# Patient Record
Sex: Male | Born: 1960 | Race: White | Hispanic: No | Marital: Married | Smoking: Former smoker
Health system: Southern US, Community
[De-identification: ages and names within clinical notes are randomized; demographics above are authoritative.]

## PROBLEM LIST (undated history)

## (undated) DIAGNOSIS — I639 Cerebral infarction, unspecified: Secondary | ICD-10-CM

## (undated) DIAGNOSIS — I2510 Atherosclerotic heart disease of native coronary artery without angina pectoris: Secondary | ICD-10-CM

## (undated) DIAGNOSIS — K760 Fatty (change of) liver, not elsewhere classified: Secondary | ICD-10-CM

## (undated) DIAGNOSIS — E785 Hyperlipidemia, unspecified: Secondary | ICD-10-CM

## (undated) DIAGNOSIS — E119 Type 2 diabetes mellitus without complications: Secondary | ICD-10-CM

## (undated) HISTORY — DX: Hyperlipidemia, unspecified: E78.5

## (undated) HISTORY — PX: SHOULDER SURGERY: SHX246

## (undated) HISTORY — PX: CHOLECYSTECTOMY: SHX55

## (undated) HISTORY — DX: Cerebral infarction, unspecified: I63.9

## (undated) HISTORY — DX: Fatty (change of) liver, not elsewhere classified: K76.0

---

## 2007-03-04 DIAGNOSIS — J309 Allergic rhinitis, unspecified: Secondary | ICD-10-CM | POA: Insufficient documentation

## 2007-03-04 DIAGNOSIS — J45909 Unspecified asthma, uncomplicated: Secondary | ICD-10-CM | POA: Insufficient documentation

## 2010-10-24 DIAGNOSIS — M109 Gout, unspecified: Secondary | ICD-10-CM | POA: Insufficient documentation

## 2010-10-24 DIAGNOSIS — M5030 Other cervical disc degeneration, unspecified cervical region: Secondary | ICD-10-CM | POA: Insufficient documentation

## 2010-10-24 DIAGNOSIS — M1990 Unspecified osteoarthritis, unspecified site: Secondary | ICD-10-CM | POA: Insufficient documentation

## 2012-02-04 DIAGNOSIS — N390 Urinary tract infection, site not specified: Secondary | ICD-10-CM | POA: Insufficient documentation

## 2012-02-04 DIAGNOSIS — N451 Epididymitis: Secondary | ICD-10-CM | POA: Insufficient documentation

## 2013-11-22 DIAGNOSIS — E1142 Type 2 diabetes mellitus with diabetic polyneuropathy: Secondary | ICD-10-CM | POA: Diagnosis present

## 2014-03-02 DIAGNOSIS — M722 Plantar fascial fibromatosis: Secondary | ICD-10-CM | POA: Insufficient documentation

## 2014-04-17 DIAGNOSIS — K2960 Other gastritis without bleeding: Secondary | ICD-10-CM | POA: Diagnosis present

## 2014-04-17 DIAGNOSIS — E669 Obesity, unspecified: Secondary | ICD-10-CM | POA: Diagnosis present

## 2014-04-17 DIAGNOSIS — K449 Diaphragmatic hernia without obstruction or gangrene: Secondary | ICD-10-CM | POA: Insufficient documentation

## 2015-11-02 DIAGNOSIS — E559 Vitamin D deficiency, unspecified: Secondary | ICD-10-CM | POA: Insufficient documentation

## 2018-05-16 ENCOUNTER — Observation Stay (HOSPITAL_COMMUNITY)
Admission: EM | Admit: 2018-05-16 | Discharge: 2018-05-17 | Disposition: A | Discharge status: Home / Self Care | Payer: Medicare Other | Attending: Internal Medicine | Admitting: Internal Medicine

## 2018-05-16 ENCOUNTER — Other Ambulatory Visit: Payer: Self-pay

## 2018-05-16 ENCOUNTER — Emergency Department (HOSPITAL_COMMUNITY): Payer: Medicare Other

## 2018-05-16 ENCOUNTER — Encounter (HOSPITAL_COMMUNITY): Payer: Self-pay

## 2018-05-16 DIAGNOSIS — Z79899 Other long term (current) drug therapy: Secondary | ICD-10-CM | POA: Insufficient documentation

## 2018-05-16 DIAGNOSIS — E1142 Type 2 diabetes mellitus with diabetic polyneuropathy: Secondary | ICD-10-CM | POA: Diagnosis present

## 2018-05-16 DIAGNOSIS — R079 Chest pain, unspecified: Secondary | ICD-10-CM | POA: Diagnosis present

## 2018-05-16 DIAGNOSIS — R42 Dizziness and giddiness: Secondary | ICD-10-CM | POA: Diagnosis not present

## 2018-05-16 DIAGNOSIS — Z91030 Bee allergy status: Secondary | ICD-10-CM | POA: Insufficient documentation

## 2018-05-16 DIAGNOSIS — Z87891 Personal history of nicotine dependence: Secondary | ICD-10-CM | POA: Diagnosis not present

## 2018-05-16 DIAGNOSIS — Z888 Allergy status to other drugs, medicaments and biological substances status: Secondary | ICD-10-CM | POA: Diagnosis not present

## 2018-05-16 DIAGNOSIS — Z794 Long term (current) use of insulin: Secondary | ICD-10-CM | POA: Insufficient documentation

## 2018-05-16 DIAGNOSIS — R0602 Shortness of breath: Secondary | ICD-10-CM | POA: Diagnosis not present

## 2018-05-16 DIAGNOSIS — Z9989 Dependence on other enabling machines and devices: Secondary | ICD-10-CM | POA: Insufficient documentation

## 2018-05-16 DIAGNOSIS — K219 Gastro-esophageal reflux disease without esophagitis: Secondary | ICD-10-CM | POA: Diagnosis not present

## 2018-05-16 DIAGNOSIS — G4733 Obstructive sleep apnea (adult) (pediatric): Secondary | ICD-10-CM | POA: Insufficient documentation

## 2018-05-16 DIAGNOSIS — R0789 Other chest pain: Principal | ICD-10-CM | POA: Insufficient documentation

## 2018-05-16 DIAGNOSIS — Z6832 Body mass index (BMI) 32.0-32.9, adult: Secondary | ICD-10-CM | POA: Diagnosis not present

## 2018-05-16 DIAGNOSIS — Z8249 Family history of ischemic heart disease and other diseases of the circulatory system: Secondary | ICD-10-CM | POA: Insufficient documentation

## 2018-05-16 DIAGNOSIS — E1169 Type 2 diabetes mellitus with other specified complication: Secondary | ICD-10-CM | POA: Diagnosis present

## 2018-05-16 DIAGNOSIS — I209 Angina pectoris, unspecified: Secondary | ICD-10-CM

## 2018-05-16 DIAGNOSIS — E7800 Pure hypercholesterolemia, unspecified: Secondary | ICD-10-CM | POA: Diagnosis not present

## 2018-05-16 DIAGNOSIS — Z7982 Long term (current) use of aspirin: Secondary | ICD-10-CM | POA: Insufficient documentation

## 2018-05-16 DIAGNOSIS — E669 Obesity, unspecified: Secondary | ICD-10-CM | POA: Diagnosis present

## 2018-05-16 DIAGNOSIS — E785 Hyperlipidemia, unspecified: Secondary | ICD-10-CM | POA: Insufficient documentation

## 2018-05-16 DIAGNOSIS — Z8673 Personal history of transient ischemic attack (TIA), and cerebral infarction without residual deficits: Secondary | ICD-10-CM | POA: Diagnosis not present

## 2018-05-16 DIAGNOSIS — R05 Cough: Secondary | ICD-10-CM | POA: Diagnosis not present

## 2018-05-16 DIAGNOSIS — K2960 Other gastritis without bleeding: Secondary | ICD-10-CM | POA: Diagnosis present

## 2018-05-16 HISTORY — DX: Type 2 diabetes mellitus without complications: E11.9

## 2018-05-16 LAB — BASIC METABOLIC PANEL
ANION GAP: 15 (ref 5–15)
BUN: 17 mg/dL (ref 6–20)
CALCIUM: 9.3 mg/dL (ref 8.9–10.3)
CO2: 20 mmol/L — ABNORMAL LOW (ref 22–32)
Chloride: 97 mmol/L — ABNORMAL LOW (ref 98–111)
Creatinine, Ser: 1.09 mg/dL (ref 0.61–1.24)
GLUCOSE: 317 mg/dL — AB (ref 70–99)
Potassium: 4.5 mmol/L (ref 3.5–5.1)
SODIUM: 132 mmol/L — AB (ref 135–145)

## 2018-05-16 LAB — CBC
HCT: 45.1 % (ref 39.0–52.0)
Hemoglobin: 15.1 g/dL (ref 13.0–17.0)
MCH: 29.8 pg (ref 26.0–34.0)
MCHC: 33.5 g/dL (ref 30.0–36.0)
MCV: 89 fL (ref 78.0–100.0)
PLATELETS: 162 10*3/uL (ref 150–400)
RBC: 5.07 MIL/uL (ref 4.22–5.81)
RDW: 12.6 % (ref 11.5–15.5)
WBC: 6.5 10*3/uL (ref 4.0–10.5)

## 2018-05-16 LAB — GLUCOSE, CAPILLARY
GLUCOSE-CAPILLARY: 171 mg/dL — AB (ref 70–99)
Glucose-Capillary: 231 mg/dL — ABNORMAL HIGH (ref 70–99)

## 2018-05-16 LAB — TROPONIN I
Troponin I: <0.03 ng/mL (ref <0.03)
Troponin I: <0.03 ng/mL (ref <0.03)
Troponin I: <0.03 ng/mL (ref <0.03)

## 2018-05-16 LAB — I-STAT TROPONIN, ED: TROPONIN I, POC: 0 ng/mL (ref 0.00–0.08)

## 2018-05-16 LAB — CBG MONITORING, ED: GLUCOSE-CAPILLARY: 283 mg/dL — AB (ref 70–99)

## 2018-05-16 MED ORDER — ONDANSETRON HCL 4 MG/2ML IJ SOLN: 4.0000 mg | Freq: Four times a day (QID) | INTRAMUSCULAR | Status: DC | PRN

## 2018-05-16 MED ORDER — ASPIRIN 81 MG PO CHEW: 324.0000 mg | CHEWABLE_TABLET | Freq: Once | ORAL | Status: DC

## 2018-05-16 MED ORDER — MULTI-VITAMIN/MINERALS PO TABS: 1.0000 | ORAL_TABLET | Freq: Every day | ORAL | Status: DC

## 2018-05-16 MED ORDER — HYDROCODONE-ACETAMINOPHEN 5-325 MG PO TABS: 1.0000 | ORAL_TABLET | Freq: Two times a day (BID) | ORAL | Status: DC | PRN

## 2018-05-16 MED ORDER — INSULIN ASPART 100 UNIT/ML ~~LOC~~ SOLN
0.0000 [IU] | Freq: Three times a day (TID) | SUBCUTANEOUS | Status: DC
  Administered 2018-05-16: 5 [IU] via SUBCUTANEOUS
  Filled 2018-05-16: qty 1

## 2018-05-16 MED ORDER — PANTOPRAZOLE SODIUM 40 MG PO TBEC
40.0000 mg | DELAYED_RELEASE_TABLET | Freq: Every day | ORAL | Status: DC
  Filled 2018-05-16: qty 1

## 2018-05-16 MED ORDER — GI COCKTAIL ~~LOC~~: 30.0000 mL | Freq: Four times a day (QID) | ORAL | Status: DC | PRN

## 2018-05-16 MED ORDER — ENOXAPARIN SODIUM 40 MG/0.4ML ~~LOC~~ SOLN
40.0000 mg | SUBCUTANEOUS | Status: DC
  Administered 2018-05-16: 40 mg via SUBCUTANEOUS
  Filled 2018-05-16: qty 0.4

## 2018-05-16 MED ORDER — PRAVASTATIN SODIUM 40 MG PO TABS: 40.0000 mg | ORAL_TABLET | Freq: Every day | ORAL | Status: DC

## 2018-05-16 MED ORDER — MORPHINE SULFATE (PF) 2 MG/ML IV SOLN: 2.0000 mg | INTRAVENOUS | Status: DC | PRN

## 2018-05-16 MED ORDER — ASPIRIN EC 325 MG PO TBEC
325.0000 mg | DELAYED_RELEASE_TABLET | Freq: Every day | ORAL | Status: DC
  Filled 2018-05-16: qty 1

## 2018-05-16 MED ORDER — ACETAMINOPHEN 325 MG PO TABS: 650.0000 mg | ORAL_TABLET | ORAL | Status: DC | PRN

## 2018-05-16 MED ORDER — GLIPIZIDE 5 MG PO TABS
5.0000 mg | ORAL_TABLET | Freq: Every day | ORAL | Status: DC
  Administered 2018-05-17: 5 mg via ORAL
  Filled 2018-05-16: qty 1

## 2018-05-16 MED ORDER — GABAPENTIN 400 MG PO CAPS
800.0000 mg | ORAL_CAPSULE | Freq: Three times a day (TID) | ORAL | Status: DC
  Filled 2018-05-16 (×3): qty 2

## 2018-05-16 MED ORDER — INSULIN ASPART PROT & ASPART (70-30 MIX) 100 UNIT/ML ~~LOC~~ SUSP
25.0000 [IU] | Freq: Two times a day (BID) | SUBCUTANEOUS | Status: DC
  Administered 2018-05-16 – 2018-05-17 (×2): 25 [IU] via SUBCUTANEOUS
  Filled 2018-05-16: qty 10

## 2018-05-16 MED ORDER — ADULT MULTIVITAMIN W/MINERALS CH
1.0000 | ORAL_TABLET | Freq: Every day | ORAL | Status: DC
  Filled 2018-05-16 (×2): qty 1

## 2018-05-16 MED ORDER — DIPHENHYDRAMINE HCL 25 MG PO CAPS
25.0000 mg | ORAL_CAPSULE | Freq: Four times a day (QID) | ORAL | Status: DC | PRN
  Filled 2018-05-16: qty 1

## 2018-05-16 MED ORDER — ALLOPURINOL 300 MG PO TABS
300.0000 mg | ORAL_TABLET | Freq: Every day | ORAL | Status: DC
  Filled 2018-05-16 (×2): qty 1

## 2018-05-16 MED ORDER — CYCLOBENZAPRINE HCL 10 MG PO TABS: 10.0000 mg | ORAL_TABLET | Freq: Two times a day (BID) | ORAL | Status: DC | PRN

## 2018-05-16 NOTE — ED Notes (Signed)
Ordered lunch tray for patient

## 2018-05-16 NOTE — ED Notes (Signed)
Spoke to patient placement.  Will work on getting patient re-assigned to [facility]

## 2018-05-16 NOTE — Progress Notes (Signed)
Pt asking this nurse about antibiotic - Augmentin that was prescribed to him 2 days ago.  Sent text page to X. Blount asking about medication.

## 2018-05-16 NOTE — ED Triage Notes (Signed)
[facility] for sharp substernal chest pain, non radiating that started while pt was sitting watching his wife in a triatholon. Per EMS pt diaphoretic, nauseous, pale. Pt states does not have a cardiac hx, but did have a TIA in 2017. Pt also c/o cough x4 years. Pt states he only feels nauseous when he coughs. Pt given 324mg  aspirin, which pt states resolved his chest pain. Pt A+Ox4.

## 2018-05-16 NOTE — Progress Notes (Signed)
Pt declines Allopurinol at this time.  Pt  States he takes medication PRN depending on if he eats seafood.   Pt takes Gabapentin normally at 11am, 7pm, and bedtime and may not take the 7pm dose if he feels feet pain is tolerable.  Pt requesting Gabapentin at bedtime.

## 2018-05-16 NOTE — H&P (Signed)
History and Physical    Eric [name] ZOX:[id number] DOB: September 12, 1960 DOA: 05/16/2018  PCP: Marlowe-Rogers, Hidi, MD  Patient coming from: Watching a triathlon race here in town  I have personally briefly reviewed patient's old medical records in [facility]  Chief Complaint: Eric [name] for 6 to 12 hours  HPI: Eric [name] is a 57 y.o. male with medical history significant of Beatties type II, TIA, elevated cholesterol and gastroesophageal reflux disease who presents to the emergency department complaining of start sharp substernal chest [name] which does not radiate that started while the patient was sitting watching his wife in a triathlon about 6 hours ago.  Upon arrival of EMS patient was direct diaphoretic, he was nauseous and pale.  Patient states he does not have a cardiac history but did have a TIA in 2017.  He has had a cough for 4 years but states he only feels nauseous when he coughs.  He was recently started on Augmentin for his cough.  He was given aspirin which she states resolved his chest [name].  History of gastroesophageal reflux disease and takes proton pump inhibitor at home.  He enjoys dipping snuff but does not use any recreational drugs.  He smoked from when he was in the first grade until the 12th grade in 1981 and then stopped smoking.  Had episodes of chest [name] in the past and his EKG shows some Q waves instead of a prior myocardial damage but patient has no history of prior myocardial infarction.  ED Course: EKG shows Q waves, sodium is 132, blood glucose 317, chest x-ray unremarkable.  Troponin is negative  Review of Systems: As per HPI otherwise all other systems reviewed and  negative.    Past Medical History:  Diagnosis Date  . Diabetes mellitus without complication (HCC)     History reviewed. No pertinent surgical history.  Social History   Social History Narrative   Smoked from 1st grade to 12th grade then quit     reports that he quit smoking  about 38 years ago. His smoking use included cigarettes. He started smoking about 51 years ago. He has a 12.00 pack-year smoking history. His smokeless tobacco use includes snuff. He reports that he drinks alcohol. He reports that he does not use drugs.  Allergies  Allergen Reactions  . Bee Venom Swelling  . Lisinopril Cough    Coughing, sweating, sneezing    Family History  Problem Relation Age of Onset  . Hypothyroidism Mother   . Hyperlipidemia Mother   . Heart attack Eric [name]    Eric [name] died with first massive heart attack at 57 years old and was a non-smoker  Prior to Admission medications   Medication Sig Start Date End Date Taking? Authorizing Provider  allopurinol (ZYLOPRIM) 300 MG tablet Take 300 mg by mouth daily. 12/24/17  Yes [provider]  amoxicillin-clavulanate (AUGMENTIN) 875-125 MG tablet Take 875 tablets by mouth 2 (two) times daily. For 10 days 05/12/18 05/22/18 Yes [provider]  cyclobenzaprine (FLEXERIL) 10 MG tablet Take 10 mg by mouth 2 (two) times daily as needed for muscle spasms. 05/01/17  Yes [provider]  diphenhydrAMINE (BENADRYL) 25 MG tablet Take 25 mg by mouth every 6 (six) hours as needed for itching.   Yes [provider]  gabapentin (NEURONTIN) 400 MG capsule Take 800 mg by mouth 3 (three) times daily. 04/27/18  Yes [provider]  glipiZIDE (GLUCOTROL) 5 MG tablet Take 5 mg by mouth daily. 03/12/18  Yes [provider]  HYDROcodone-acetaminophen (NORCO/VICODIN) 5-325 MG tablet Take 1 tablet by mouth 2 (two) times daily as needed for moderate [name] (for 30 days).  05/15/18 06/14/18 Yes [provider]  insulin lispro protamine-lispro (HUMALOG 75/25 MIX) (75-25) 100 UNIT/ML SUSP injection Inject 30-35 Units into the skin See admin instructions. Inject 35 units in am and 30 units bedtime. 02/11/18  Yes [provider]  lovastatin (MEVACOR) 40 MG tablet Take 40 mg by mouth daily. 04/27/18   Yes [provider]  metFORMIN (GLUCOPHAGE) 1000 MG tablet Take 1,000 mg by mouth 2 (two) times daily with a meal.  04/01/18  Yes [provider]  Multiple Vitamins-Minerals (MULTIVITAMIN WITH MINERALS) tablet Take 1 tablet by mouth daily.   Yes [provider]  omeprazole (PRILOSEC) 20 MG capsule Take 40 mg by mouth daily as needed (acid reflux).  03/25/18  Yes [provider]  OVER THE COUNTER MEDICATION Take 2 tablets by mouth daily. Eye vitamin (bill-berry eye vision)   Yes [provider]    Physical Exam:  Constitutional: NAD, calm, comfortable Vitals:   05/16/18 1208 05/16/18 1215 05/16/18 1315 05/16/18 1415  BP: 119/84 119/88 108/81 104/90  Pulse: 83 89 78 76  Resp: 18 16 17 20   Temp: 98.1 F (36.7 C)     TempSrc: Oral     SpO2: 98% 99% 96% 97%   Eyes: PERRL, lids and conjunctivae normal ENMT: Mucous membranes are moist. Posterior pharynx clear of any exudate or lesions.Normal dentition.  Neck: normal, supple, no masses, no thyromegaly Respiratory: clear to auscultation bilaterally, no wheezing, no crackles. Normal respiratory effort. No accessory muscle use.  Cardiovascular: Regular rate and rhythm, no murmurs / rubs / gallops. No extremity edema. 2+ pedal pulses. No carotid bruits.  Abdomen: no tenderness, no masses palpated. No hepatosplenomegaly. Bowel sounds positive.  Musculoskeletal: no clubbing / cyanosis. No joint deformity upper and lower extremities. Good ROM, no contractures. Normal muscle tone.  Skin: no rashes, lesions, ulcers. No induration Neurologic: CN 2-12 grossly intact. Sensation intact, DTR normal. Strength 5/5 in all 4.  Psychiatric: Normal judgment and insight. Alert and oriented x 3. Normal mood.    Labs on Admission: I have personally reviewed following labs and imaging studies  CBC: Recent Labs  Lab 05/16/18 1216  WBC 6.5  HGB 15.1  HCT 45.1  MCV 89.0  PLT 162   Basic Metabolic Panel: Recent Labs   Lab 05/16/18 1216  NA 132*  K 4.5  CL 97*  CO2 20*  GLUCOSE 317*  BUN 17  CREATININE 1.09  CALCIUM 9.3   CBG: Recent Labs  Lab 05/16/18 1232  GLUCAP 283*   Radiological Exams on Admission: Dg Chest Portable 1 View  Result Date: 05/16/2018 CLINICAL DATA:  Chest [name], shortness of breath and dizziness onset today. History of diabetes. EXAM: PORTABLE CHEST 1 VIEW COMPARISON:  None. FINDINGS: The heart size and mediastinal contours are within normal limits. Both lungs are clear. The visualized skeletal structures are unremarkable. IMPRESSION: No active disease.  No evidence of pneumonia or pulmonary edema. Electronically Signed   By: Bary Richard M.D.   On: 05/16/2018 12:44    EKG: Independently reviewed.  Sinus rhythm with borderline intraventricular conduction delay and inferior Q waves read no priors available for comparison    Assessment/Plan Principal Problem:   Chest [name] Active Problems:   Diabetic peripheral neuropathy associated with type 2 diabetes mellitus (HCC)   Type 2 diabetes mellitus with hyperlipidemia (HCC)  Obesity (BMI 30-39.9)   Reflux gastritis   1.  Chest [name]: Patient with several risk factors including diabetes and hypercholesterolemia and family history.  His history is suggestive.  He has had a prior TIA.  Recommend patient placed in observation to rule out myocardial infarction, will check echocardiogram to evaluate Q waves on EKG and consider stress testing.  2.  Diabetic peripheral neuropathy with type 2 diabetes: Noted continue outpatient management.  3.  Hyperlipidemia associated with type 2 diabetes mellitus: Continue statin.  4.  Obesity: Noted loss urged.  5.Reflux gastritis: Noted continue proton pump inhibitor.  DVT prophylaxis: Lovenox Code Status: Code Family Communication: Talk with patient and wife who was present at the bedside. Disposition Plan: Likely home in 24 to 48 hours Consults called: None message sent to Daun Peacockrish Trent  for a.m. evaluation for possible stress test versus cardiac cath Admission status: Observation   Lahoma Crockerheresa C Margi Edmundson MD FACP [facility] Pager [phone]- [phone]  If 7PM-7AM, please contact night-coverage [email or web address] Password [email or web address]  05/16/2018, 2:26 PM

## 2018-05-16 NOTE — ED Provider Notes (Signed)
[facility] EMERGENCY DEPARTMENT Provider Note   CSN: [id number] Arrival date & time: 05/16/18  1157     History   Chief Complaint Chief Complaint  Patient presents with  . Chest Pain    HPI Eric [name] is a 57 y.o. male.  The history is provided by the patient.  Chest Pain   This is a new problem. The current episode started 6 to 12 hours ago. The problem occurs every several days. The problem has been resolved. The pain is associated with exertion and rest. The pain is present in the substernal region. The pain is at a severity of 3/10. The pain is moderate. The quality of the pain is described as pressure-like. The pain does not radiate. The symptoms are aggravated by exertion. Associated symptoms include diaphoresis. Pertinent negatives include no abdominal pain, no back pain, no cough, no fever, no orthopnea, no palpitations, no shortness of breath, no syncope and no vomiting. He has tried rest for the symptoms. The treatment provided mild relief. Risk factors include male gender.  His past medical history is significant for diabetes, hyperlipidemia and TIA.  Pertinent negatives for past medical history include no hypertension and no seizures.  Procedure history is negative for cardiac catheterization.    Past Medical History:  Diagnosis Date  . Diabetes mellitus without complication [facility])     Patient Active Problem List   Diagnosis Date Noted  . Chest pain 05/16/2018  . Type 2 diabetes mellitus with hyperlipidemia (HCC) 05/16/2018  . Obesity (BMI 30-39.9) 04/17/2014  . Reflux gastritis 04/17/2014  . Diabetic peripheral neuropathy associated with type 2 diabetes mellitus (HCC) 11/22/2013    History reviewed. No pertinent surgical history.      Home Medications    Prior to Admission medications   Medication Sig Start Date End Date Taking? Authorizing Provider  allopurinol (ZYLOPRIM) 300 MG tablet Take 300 mg by mouth daily. 12/24/17  Yes  [provider]  amoxicillin-clavulanate (AUGMENTIN) 875-125 MG tablet Take 875 tablets by mouth 2 (two) times daily. For 10 days 05/12/18 05/22/18 Yes [provider]  cyclobenzaprine (FLEXERIL) 10 MG tablet Take 10 mg by mouth 2 (two) times daily as needed for muscle spasms. 05/01/17  Yes [provider]  diphenhydrAMINE (BENADRYL) 25 MG tablet Take 25 mg by mouth every 6 (six) hours as needed for itching.   Yes [provider]  gabapentin (NEURONTIN) 400 MG capsule Take 800 mg by mouth 3 (three) times daily. 04/27/18  Yes [provider]  glipiZIDE (GLUCOTROL) 5 MG tablet Take 5 mg by mouth daily. 03/12/18  Yes [provider]  HYDROcodone-acetaminophen (NORCO/VICODIN) 5-325 MG tablet Take 1 tablet by mouth 2 (two) times daily as needed for moderate pain (for 30 days).  05/15/18 06/14/18 Yes [provider]  insulin lispro protamine-lispro (HUMALOG 75/25 MIX) (75-25) 100 UNIT/ML SUSP injection Inject 30-35 Units into the skin See admin instructions. Inject 35 units in am and 30 units bedtime. 02/11/18  Yes [provider]  lovastatin (MEVACOR) 40 MG tablet Take 40 mg by mouth daily. 04/27/18  Yes [provider]  metFORMIN (GLUCOPHAGE) 1000 MG tablet Take 1,000 mg by mouth 2 (two) times daily with a meal.  04/01/18  Yes [provider]  Multiple Vitamins-Minerals (MULTIVITAMIN WITH MINERALS) tablet Take 1 tablet by mouth daily.   Yes [provider]  omeprazole (PRILOSEC) 20 MG capsule Take 40 mg by mouth daily as needed (acid reflux).  03/25/18  Yes [provider]  OVER THE COUNTER MEDICATION Take 2 tablets by mouth daily. Eye vitamin (bill-berry eye vision)   Yes [provider]    Family History Family History  Problem Relation Age of Onset  . Hypothyroidism Mother   . Hyperlipidemia Mother   . Heart attack Father     Social History Social History   Tobacco Use  . Smoking  status: Former Smoker    Packs/day: 1.00    Years: 12.00    Pack years: 12.00    Types: Cigarettes    Start date: 1968    Last attempt to quit: 1981    Years since quitting: 38.7  . Smokeless tobacco: Current User    Types: Snuff  Substance Use Topics  . Alcohol use: Yes    Comment: rare social couple of times a year  . Drug use: Never     Allergies   Bee venom and Lisinopril   Review of Systems Review of Systems  Constitutional: Positive for diaphoresis. Negative for chills and fever.  HENT: Negative for ear pain and sore throat.   Eyes: Negative for pain and visual disturbance.  Respiratory: Negative for cough and shortness of breath.   Cardiovascular: Positive for chest pain. Negative for palpitations, orthopnea and syncope.  Gastrointestinal: Negative for abdominal pain and vomiting.  Genitourinary: Negative for dysuria and hematuria.  Musculoskeletal: Negative for arthralgias and back pain.  Skin: Negative for color change and rash.  Neurological: Negative for seizures and syncope.  All other systems reviewed and are negative.    Physical Exam Updated Vital Signs  ED Triage Vitals  Enc Vitals Group     BP 05/16/18 1208 119/84     Pulse Rate 05/16/18 1208 83     Resp 05/16/18 1208 18     Temp 05/16/18 1208 98.1 F (36.7 C)     Temp Source 05/16/18 1208 Oral     SpO2 05/16/18 1208 98 %     Weight --      Height --      Head Circumference --      Peak Flow --      Pain Score 05/16/18 1207 0     Pain Loc --      Pain Edu? --      Excl. in GC? --     Physical Exam  Constitutional: He is oriented to person, place, and time. He appears well-developed and well-nourished.  HENT:  Head: Normocephalic and atraumatic.  Eyes: Pupils are equal, round, and reactive to light. Conjunctivae and EOM are normal.  Neck: Normal range of motion. Neck supple.  Cardiovascular: Normal rate, regular rhythm, intact distal pulses and normal pulses.  No murmur  heard. Pulmonary/Chest: Effort normal and breath sounds normal. No respiratory distress. He has no decreased breath sounds.  Abdominal: Soft. There is no tenderness.  Musculoskeletal: He exhibits no edema.       Right lower leg: He exhibits no edema.       Left lower leg: He exhibits no edema.  Neurological: He is alert and oriented to person, place, and time.  Skin: Skin is warm and dry. Capillary refill takes less than 2 seconds.  Psychiatric: He has a normal mood and affect.  Nursing note and vitals reviewed.    ED Treatments / Results  Labs (all labs ordered are listed, but only abnormal results are displayed) Labs Reviewed  BASIC METABOLIC PANEL - Abnormal; Notable for the following components:      Result Value  Sodium 132 (*)    Chloride 97 (*)    CO2 20 (*)    Glucose, Bld 317 (*)    All other components within normal limits  CBG MONITORING, ED - Abnormal; Notable for the following components:   Glucose-Capillary 283 (*)    All other components within normal limits  CBC  HIV ANTIBODY (ROUTINE TESTING W REFLEX)  HEMOGLOBIN A1C  TROPONIN I  TROPONIN I  TROPONIN I  I-STAT TROPONIN, ED    EKG EKG Interpretation  Date/Time:  Sunday May 16 2018 12:10:54 EDT Ventricular Rate:  80 PR Interval:    QRS Duration: 113 QT Interval:  371 QTC Calculation: 428 R Axis:   52 Text Interpretation:  Sinus rhythm Borderline intraventricular conduction delay Abnormal inferior Q waves Confirmed by Virgina NorfolkAdam, Kimberleigh Mehan [phone]([id number]) on 05/16/2018 12:14:38 PM   Radiology Dg Chest Portable 1 View  Result Date: 05/16/2018 CLINICAL DATA:  Chest pain, shortness of breath and dizziness onset today. History of diabetes. EXAM: PORTABLE CHEST 1 VIEW COMPARISON:  None. FINDINGS: The heart size and mediastinal contours are within normal limits. Both lungs are clear. The visualized skeletal structures are unremarkable. IMPRESSION: No active disease.  No evidence of pneumonia or pulmonary edema.  Electronically Signed   By: Bary RichardStan  Maynard M.D.   On: 05/16/2018 12:44    Procedures Procedures (including critical care time)  Medications Ordered in ED Medications  aspirin chewable tablet 324 mg (324 mg Oral Not Given 05/16/18 1214)  allopurinol (ZYLOPRIM) tablet 300 mg (has no administration in time range)  HYDROcodone-acetaminophen (NORCO/VICODIN) 5-325 MG per tablet 1 tablet (has no administration in time range)  pravastatin (PRAVACHOL) tablet 40 mg (has no administration in time range)  glipiZIDE (GLUCOTROL) tablet 5 mg (has no administration in time range)  insulin aspart protamine- aspart (NOVOLOG MIX 70/30) injection 25 Units (has no administration in time range)  pantoprazole (PROTONIX) EC tablet 40 mg (has no administration in time range)  cyclobenzaprine (FLEXERIL) tablet 10 mg (has no administration in time range)  gabapentin (NEURONTIN) capsule 800 mg (has no administration in time range)  multivitamin with minerals tablet 1 tablet (has no administration in time range)  diphenhydrAMINE (BENADRYL) tablet 25 mg (has no administration in time range)  acetaminophen (TYLENOL) tablet 650 mg (has no administration in time range)  ondansetron (ZOFRAN) injection 4 mg (has no administration in time range)  insulin aspart (novoLOG) injection 0-15 Units (has no administration in time range)  enoxaparin (LOVENOX) injection 40 mg (has no administration in time range)  morphine 2 MG/ML injection 2 mg (has no administration in time range)  gi cocktail (Maalox,Lidocaine,Donnatal) (has no administration in time range)  aspirin EC tablet 325 mg (has no administration in time range)     Initial Impression / Assessment and Plan / ED Course  I have reviewed the triage vital signs and the nursing notes.  Pertinent labs & imaging results that were available during my care of the patient were reviewed by me and considered in my medical decision making (see chart for details).     Jeralene PetersKelly  Toohey is a 57 year old male with history of diabetes, high cholesterol who presents to the ED with chest pain.  Patient with normal vitals.  No fever.  Patient with episode of chest pain several hours ago that left him diaphoretic, nauseous.  Patient took 324 mg aspirin with EMS and pain has resolved.  Patient has had intermittent chest pain over the last several weeks.  EKG shows sinus rhythm.  No signs of ischemic changes.  However patient does have some Q waves and appear that patient possibly had cardiac event recently.  Has history of high cholesterol, diabetes, TIA.  Patient with no DVT or PE risk factors.  No infectious symptoms.  Concern for ACS.  Patient at high risk.  Initial troponin within normal limits.  Patient with no significant anemia, electrolyte abnormality.  No kidney injury.  Patient likely with anginal chest pain.  Chest x-ray showed no signs of pneumonia, pneumothorax, no pleural effusion.  Patient to be admitted to medicine service for further cardiac rule out.  Remained hemodynamically stable throughout my care.  Final Clinical Impressions(s) / ED Diagnoses   Final diagnoses:  Chest pain, unspecified type    ED Discharge Orders    None       Virgina Norfolk, DO 05/16/18 1433

## 2018-05-17 ENCOUNTER — Other Ambulatory Visit (HOSPITAL_COMMUNITY): Payer: Medicare Other

## 2018-05-17 ENCOUNTER — Other Ambulatory Visit: Payer: Self-pay | Admitting: Cardiology

## 2018-05-17 DIAGNOSIS — E1142 Type 2 diabetes mellitus with diabetic polyneuropathy: Secondary | ICD-10-CM

## 2018-05-17 DIAGNOSIS — E782 Mixed hyperlipidemia: Secondary | ICD-10-CM | POA: Diagnosis not present

## 2018-05-17 DIAGNOSIS — E1169 Type 2 diabetes mellitus with other specified complication: Secondary | ICD-10-CM

## 2018-05-17 DIAGNOSIS — R079 Chest pain, unspecified: Secondary | ICD-10-CM

## 2018-05-17 DIAGNOSIS — R072 Precordial pain: Secondary | ICD-10-CM

## 2018-05-17 DIAGNOSIS — E785 Hyperlipidemia, unspecified: Secondary | ICD-10-CM

## 2018-05-17 DIAGNOSIS — E669 Obesity, unspecified: Secondary | ICD-10-CM | POA: Diagnosis not present

## 2018-05-17 DIAGNOSIS — R0789 Other chest pain: Secondary | ICD-10-CM

## 2018-05-17 LAB — HEMOGLOBIN A1C
HEMOGLOBIN A1C: 10.2 % — AB (ref 4.8–5.6)
MEAN PLASMA GLUCOSE: 246 mg/dL

## 2018-05-17 LAB — HIV ANTIBODY (ROUTINE TESTING W REFLEX): HIV SCREEN 4TH GENERATION: NONREACTIVE

## 2018-05-17 LAB — GLUCOSE, CAPILLARY: Glucose-Capillary: 175 mg/dL — ABNORMAL HIGH (ref 70–99)

## 2018-05-17 MED ORDER — ASPIRIN 325 MG PO TBEC: 325.0000 mg | DELAYED_RELEASE_TABLET | Freq: Every day | ORAL | 0 refills | Status: DC

## 2018-05-17 MED ORDER — AMOXICILLIN-POT CLAVULANATE 875-125 MG PO TABS
875.0000 mg | ORAL_TABLET | Freq: Two times a day (BID) | ORAL | Status: DC
  Filled 2018-05-17: qty 1

## 2018-05-17 MED ORDER — PRAVASTATIN SODIUM 40 MG PO TABS
40.0000 mg | ORAL_TABLET | Freq: Every day | ORAL | Status: DC
  Filled 2018-05-17: qty 1

## 2018-05-17 MED ORDER — AMOXICILLIN-POT CLAVULANATE 875-125 MG PO TABS: 875.0000 mg | ORAL_TABLET | Freq: Two times a day (BID) | ORAL | 0 refills | Status: AC

## 2018-05-17 NOTE — Progress Notes (Addendum)
Pt and wife states they understands instructions. Home stable with steady gait. Pt offered wheel chair. Pt denies chest pain.

## 2018-05-17 NOTE — Progress Notes (Signed)
Pt refuses all 10 am medications. States he will take at home.

## 2018-05-17 NOTE — Discharge Summary (Signed)
Physician Discharge Summary  Eric [name] Alcorta JYN:[id number] DOB: 06/15/1961 DOA: 05/16/2018  PCP: Rudi CocoMarlowe-Rogers, Hidi, MD  Admit date: 05/16/2018 Discharge date: 05/17/2018  Admitted From: home Discharge disposition: home   Recommendations for Outpatient Follow-Up:   1. Outpatient CTA and echo 2. Needs better blood sugar control-- still drinks soda daily   Discharge Diagnosis:   Principal Problem:   Chest pain Active Problems:   Diabetic peripheral neuropathy associated with type 2 diabetes mellitus (HCC)   Obesity (BMI 30-39.9)   Reflux gastritis   Type 2 diabetes mellitus with hyperlipidemia (HCC)    Discharge Condition: Improved.  Diet recommendation: Low sodium, heart healthy.  Carbohydrate-modified.    Wound care: None.  Code status: Full.   History of Present Illness:   Eric [name] Giambalvo is a 57 y.o. male with medical history significant of Beatties type II, TIA, elevated cholesterol and gastroesophageal reflux disease who presents to the emergency department complaining of start sharp substernal chest pain which does not radiate that started while the patient was sitting watching his wife in a triathlon about 6 hours ago.  Upon arrival of EMS patient was direct diaphoretic, he was nauseous and pale.  Patient states he does not have a cardiac history but did have a TIA in 2017.  He has had a cough for 4 years but states he only feels nauseous when he coughs.  He was recently started on Augmentin for his cough.  He was given aspirin which she states resolved his chest pain.  History of gastroesophageal reflux disease and takes proton pump inhibitor at home.  He enjoys dipping snuff but does not use any recreational drugs.  He smoked from when he was in the first grade until the 12th grade in 1981 and then stopped smoking.  Had episodes of chest pain in the past and his EKG shows some Q waves instead of a prior myocardial damage but patient has no history of prior  myocardial infarction.   Hospital Course by Problem:   Chest pain: -While outside watching an athletic event the patient broke out in a cold sweat, felt clammy and was dizzy with central chest tightness.  No shortness of breath. Lasted about an hour, resolved after aspirin given.  -Currently chest pain-free -CVD risk factors include obesity, hyperlipidemia, poorly controlled DM, OSA essentially untreated -Troponins have been negative X 3.  -EKG with borderline IVCD, no prior for comparison.  -CXR shows no active disease -Echocardiogram can be done as outpatient -With no objective evidence of myocardial ischemia he can be discharged from a cardiology standpoint. He does have risk factors that are currently no well controlled. We advise an outpatient coronary CTA. He wants to follow with [facility] near his home and he is advised to do this. We will arrange for follow up at our office and CCTA in case he does not get into [facility]. He can cancel this if [facility].   Hyperlipidemia:  -On lovastatin 40 mg, previously tried lipitor, Zocor, Crestor.  -Uncontrolled with TC 230, Trig 592, Unable to calc LDL, HDL 28 in 10/2017 -The patient needs better blood sugar control to get his triglycerides down. -[facility]  DM type 2 on insulin:  -A1c was 10.0 on 05/12/18 previously 13.6 in 01/2018. Poorly controlled. -Target better control with A1c <7.  OSA -Pt has CPAP but only uses it rarely, when he is really tired. Advise regular use of CPAP for benefit and reduced  future risk of organ damage.     Medical Consultants:    cards  Discharge Exam:   Vitals:   05/17/18 0531 05/17/18 0954  BP: 120/85 129/85  Pulse: 68 77  Resp: 18 18  Temp: 98.6 F (37 C) 98.6 F (37 C)  SpO2: 98% 96%   Vitals:   05/16/18 1614 05/17/18 0004 05/17/18 0531 05/17/18 0954  BP:  116/78 120/85 129/85  Pulse:  72 68 77  Resp:  16 18 18   Temp:  98.7 F (37.1 C) 98.6  F (37 C) 98.6 F (37 C)  TempSrc:  Oral Oral Oral  SpO2:  98% 98% 96%  Weight: 93.9 kg     Height:        General exam: Appears calm and comfortable.   The results of significant diagnostics from this hospitalization (including imaging, microbiology, ancillary and laboratory) are listed below for reference.     Procedures and Diagnostic Studies:   Dg Chest Portable 1 View  Result Date: 05/16/2018 CLINICAL DATA:  Chest pain, shortness of breath and dizziness onset today. History of diabetes. EXAM: PORTABLE CHEST 1 VIEW COMPARISON:  None. FINDINGS: The heart size and mediastinal contours are within normal limits. Both lungs are clear. The visualized skeletal structures are unremarkable. IMPRESSION: No active disease.  No evidence of pneumonia or pulmonary edema. Electronically Signed   By: Bary Richard M.D.   On: 05/16/2018 12:44     Labs:   Basic Metabolic Panel: Recent Labs  Lab 05/16/18 1216  NA 132*  K 4.5  CL 97*  CO2 20*  GLUCOSE 317*  BUN 17  CREATININE 1.09  CALCIUM 9.3   GFR Estimated Creatinine Clearance: 81.6 mL/min (by C-G formula based on SCr of 1.09 mg/dL). Liver Function Tests: No results for input(s): AST, ALT, ALKPHOS, BILITOT, PROT, ALBUMIN in the last 168 hours. No results for input(s): LIPASE, AMYLASE in the last 168 hours. No results for input(s): AMMONIA in the last 168 hours. Coagulation profile No results for input(s): INR, PROTIME in the last 168 hours.  CBC: Recent Labs  Lab 05/16/18 1216  WBC 6.5  HGB 15.1  HCT 45.1  MCV 89.0  PLT 162   Cardiac Enzymes: Recent Labs  Lab 05/16/18 1430 05/16/18 1659 05/16/18 2031  TROPONINI <0.03 <0.03 <0.03   BNP: Invalid input(s): POCBNP CBG: Recent Labs  Lab 05/16/18 1232 05/16/18 1556 05/16/18 2248 05/17/18 0724  GLUCAP 283* 231* 171* 175*   D-Dimer No results for input(s): DDIMER in the last 72 hours. Hgb A1c No results for input(s): HGBA1C in the last 72 hours. Lipid  Profile No results for input(s): CHOL, HDL, LDLCALC, TRIG, CHOLHDL, LDLDIRECT in the last 72 hours. Thyroid function studies No results for input(s): TSH, T4TOTAL, T3FREE, THYROIDAB in the last 72 hours.  Invalid input(s): FREET3 Anemia work up No results for input(s): VITAMINB12, FOLATE, FERRITIN, TIBC, IRON, RETICCTPCT in the last 72 hours. Microbiology No results found for this or any previous visit (from the past 240 hour(s)).   Discharge Instructions:   Discharge Instructions    Diet - low sodium heart healthy   Complete by:  As directed    Diet Carb Modified   Complete by:  As directed    Increase activity slowly   Complete by:  As directed      Allergies as of 05/17/2018      Reactions   Bee Venom Swelling   Lisinopril Cough   Coughing, sweating, sneezing  Medication List    TAKE these medications   allopurinol 300 MG tablet Commonly known as:  ZYLOPRIM Take 300 mg by mouth daily.   amoxicillin-clavulanate 875-125 MG tablet Commonly known as:  AUGMENTIN Take 1 tablet by mouth 2 (two) times daily for 10 days. For 10 days What changed:  how much to take   aspirin 325 MG EC tablet Take 1 tablet (325 mg total) by mouth daily.   cyclobenzaprine 10 MG tablet Commonly known as:  FLEXERIL Take 10 mg by mouth 2 (two) times daily as needed for muscle spasms.   diphenhydrAMINE 25 MG tablet Commonly known as:  BENADRYL Take 25 mg by mouth every 6 (six) hours as needed for itching.   gabapentin 400 MG capsule Commonly known as:  NEURONTIN Take 800 mg by mouth 3 (three) times daily.   glipiZIDE 5 MG tablet Commonly known as:  GLUCOTROL Take 5 mg by mouth daily.   HYDROcodone-acetaminophen 5-325 MG tablet Commonly known as:  NORCO/VICODIN Take 1 tablet by mouth 2 (two) times daily as needed for moderate pain (for 30 days).   insulin lispro protamine-lispro (75-25) 100 UNIT/ML Susp injection Commonly known as:  HUMALOG 75/25 MIX Inject 30-35 Units into the  skin See admin instructions. Inject 35 units in am and 30 units bedtime.   lovastatin 40 MG tablet Commonly known as:  MEVACOR Take 40 mg by mouth daily.   metFORMIN 1000 MG tablet Commonly known as:  GLUCOPHAGE Take 1,000 mg by mouth 2 (two) times daily with a meal.   multivitamin with minerals tablet Take 1 tablet by mouth daily.   omeprazole 20 MG capsule Commonly known as:  PRILOSEC Take 40 mg by mouth daily as needed (acid reflux).   OVER THE COUNTER MEDICATION Take 2 tablets by mouth daily. Eye vitamin (bill-berry eye vision)      Follow-up Information    [facility] Follow up.   Specialty:  Cardiology Why:  Our office will call you to schedule a cardiac CT. Contact information: [address] [place] [address] [phone]       Thurmon Fair, MD Follow up.   Specialty:  Cardiology Why:  Our office will call you to schedule a cardiology office visit following the cardiac CT.  Contact information: [address] [place] [address] [phone]        Marlowe-Rogers, Hidi, MD Follow up in 1 week(s).   Specialty:  Family Medicine Contact information: [address] [place] [address] [phone]            Time coordinating discharge: 25 min  Signed:  Joseph Art  [facility] 05/17/2018, 10:11 AM

## 2018-05-17 NOTE — Consult Note (Addendum)
Cardiology Consultation:   Patient ID: Eric [name] MRN: [id number]; DOB: [birth year]  Admit date: 05/16/2018 Date of Consult: 05/17/2018  Primary Care Provider: Rudi CocoMarlowe-Rogers, Hidi, MD Primary Cardiologist: No primary care provider on file.   Patient Profile:   Eric [name] is a 57 y.o. male with a hx of DM type II, TIA,  HLD and GERD who is being seen today for the evaluation of chest pain at the request of Dr. Benjamine MolaVann.  History of Present Illness:   [name] developed sharp substernal chest pain accompanied by diaphoresis and nausea. He has no cardiac history but did have a TIA in 2017. He was recently started on Augmentin for a cough that has been bothering him for about 4 years. His pain went on for 6 hours and was relieved after receiving aspirin in the ED. He takes PPI for GERD.   [name] says that he was sitting down watching his wife at a triathlon when he developed feeling clammy and broke out in a cold sweat with dizziness, nausea and central chest tightness.  This episode lasted for approximately an hour.  He says that his chest felt better when he pushed on it or squeezed it.  He had no vomiting and no shortness of breath.  He has had a fairly similar episode approximately 4 years ago and spent the night in the hospital overnight.  His pain went away in the ambulance after receiving aspirin.  He has had one subsequent episode lasting only a few seconds while he was in the ED but no further episodes.  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  He does not exercise.  He says he gets short of breath with very minimal activity like walking up the 4 steps to his trailer.  He denies orthopnea, PND, edema, palpitations.  He has CPAP for OSA but rarely uses it, only when he is very tired. He he used to smoke many years ago but quit in 1981.  Troponins have been negative X 3.  EKG with borderline IVCD, no prior for comparison.  CXR shows no active disease  He had normal myoview in  2009  If the patient needs cardiology follow-up, he wishes [facility] as he lives in [place] .  Past Medical History:  Diagnosis Date  . Diabetes mellitus without complication [facility](HCC)     Past Surgical History:  Procedure Laterality Date  . CHOLECYSTECTOMY    . SHOULDER SURGERY Left      Home Medications:  Prior to Admission medications   Medication Sig Start Date End Date Taking? Authorizing Provider  allopurinol (ZYLOPRIM) 300 MG tablet Take 300 mg by mouth daily. 12/24/17  Yes [provider]  amoxicillin-clavulanate (AUGMENTIN) 875-125 MG tablet Take 875 tablets by mouth 2 (two) times daily. For 10 days 05/12/18 05/22/18 Yes [provider]  cyclobenzaprine (FLEXERIL) 10 MG tablet Take 10 mg by mouth 2 (two) times daily as needed for muscle spasms. 05/01/17  Yes [provider]  diphenhydrAMINE (BENADRYL) 25 MG tablet Take 25 mg by mouth every 6 (six) hours as needed for itching.   Yes [provider]  gabapentin (NEURONTIN) 400 MG capsule Take 800 mg by mouth 3 (three) times daily. 04/27/18  Yes [provider]  glipiZIDE (GLUCOTROL) 5 MG tablet Take 5 mg by mouth daily. 03/12/18  Yes [provider]  HYDROcodone-acetaminophen (NORCO/VICODIN) 5-325 MG tablet Take 1 tablet by mouth 2 (two) times daily as needed for moderate pain (for  30 days).  05/15/18 06/14/18 Yes [provider]  insulin lispro protamine-lispro (HUMALOG 75/25 MIX) (75-25) 100 UNIT/ML SUSP injection Inject 30-35 Units into the skin See admin instructions. Inject 35 units in am and 30 units bedtime. 02/11/18  Yes [provider]  lovastatin (MEVACOR) 40 MG tablet Take 40 mg by mouth daily. 04/27/18  Yes [provider]  metFORMIN (GLUCOPHAGE) 1000 MG tablet Take 1,000 mg by mouth 2 (two) times daily with a meal.  04/01/18  Yes [provider]  Multiple Vitamins-Minerals (MULTIVITAMIN WITH MINERALS) tablet Take 1  tablet by mouth daily.   Yes [provider]  omeprazole (PRILOSEC) 20 MG capsule Take 40 mg by mouth daily as needed (acid reflux).  03/25/18  Yes [provider]  OVER THE COUNTER MEDICATION Take 2 tablets by mouth daily. Eye vitamin (bill-berry eye vision)   Yes [provider]    Inpatient Medications: Scheduled Meds: . allopurinol  300 mg Oral Daily  . aspirin  324 mg Oral Once  . aspirin EC  325 mg Oral Daily  . enoxaparin (LOVENOX) injection  40 mg Subcutaneous Q24H  . gabapentin  800 mg Oral TID  . glipiZIDE  5 mg Oral Daily  . insulin aspart  0-15 Units Subcutaneous TID WC  . insulin aspart protamine- aspart  25 Units Subcutaneous BID WC  . multivitamin with minerals  1 tablet Oral Daily  . pantoprazole  40 mg Oral Daily  . pravastatin  40 mg Oral q1800   Continuous Infusions:  PRN Meds: acetaminophen, cyclobenzaprine, diphenhydrAMINE, gi cocktail, HYDROcodone-acetaminophen, morphine injection, ondansetron (ZOFRAN) IV  Allergies:    Allergies  Allergen Reactions  . Bee Venom Swelling  . Lisinopril Cough    Coughing, sweating, sneezing    Social History:   Social History   Socioeconomic History  . Marital status: Married    Spouse name: Luanne Bras   . Number of children: 3  . Years of education: Not on file  . Highest education level: Not on file  Occupational History  . Occupation: disability   Social Needs  . Financial resource strain: Not very hard  . Food insecurity:    Worry: Never true    Inability: Never true  . Transportation needs:    Medical: No    Non-medical: No  Tobacco Use  . Smoking status: Former Smoker    Packs/day: 1.00    Years: 12.00    Pack years: 12.00    Types: Cigarettes    Start date: 1968    Last attempt to quit: 1981    Years since quitting: 38.7  . Smokeless tobacco: Current User    Types: Snuff  Substance and Sexual Activity  . Alcohol use: Yes    Comment: rare social couple of times a year  .  Drug use: Never  . Sexual activity: Not on file  Lifestyle  . Physical activity:    Days per week: 0 days    Minutes per session: 0 min  . Stress: Not at all  Relationships  . Social connections:    Talks on phone: More than three times a week    Gets together: More than three times a week    Attends religious service: More than 4 times per year    Active member of club or organization: No    Attends meetings of clubs or organizations: Never    Relationship status: Married  . Intimate partner violence:    Fear of current or ex  partner: No    Emotionally abused: No    Physically abused: No    Forced sexual activity: No  Other Topics Concern  . Not on file  Social History Narrative   Smoked from 1st grade to 12th grade then quit    Family History:    Family History  Problem Relation Age of Onset  . Hypothyroidism Mother   . Hyperlipidemia Mother   . Heart attack Father      ROS:  Please see the history of present illness.   All other ROS reviewed and negative.     Physical Exam/Data:   Vitals:   05/16/18 1527 05/16/18 1614 05/17/18 0004 05/17/18 0531  BP: 114/87  116/78 120/85  Pulse: 76  72 68  Resp: 18  16 18   Temp: 98.8 F (37.1 C)  98.7 F (37.1 C) 98.6 F (37 C)  TempSrc: Oral  Oral Oral  SpO2: 98%  98% 98%  Weight:  93.9 kg    Height:        Intake/Output Summary (Last 24 hours) at 05/17/2018 0708 Last data filed at 05/16/2018 2000 Gross per 24 hour  Intake 120 ml  Output -  Net 120 ml   Filed Weights   05/16/18 1614  Weight: 93.9 kg   Body mass index is 32.42 kg/m.  General:  Well nourished, well developed, in no acute distress HEENT: normal Lymph: no adenopathy Neck: no JVD Endocrine:  No thryomegaly Vascular: No carotid bruits; FA pulses 2+ bilaterally without bruits  Cardiac:  normal S1, S2; RRR; no murmur  Lungs:  clear to auscultation bilaterally, no wheezing, rhonchi or rales  Abd: soft, nontender, no hepatomegaly  Ext: no  edema Musculoskeletal:  No deformities, BUE and BLE strength normal and equal Skin: warm and dry  Neuro:  CNs 2-12 intact, no focal abnormalities noted Psych:  Normal affect   EKG:  The EKG was personally reviewed and demonstrates:  Sinus rhythm, Borderline intraventricular conduction delay Telemetry:  Telemetry was personally reviewed and demonstrates: Sinus rhythm in the 70s  Relevant CV Studies:  [place] pending  Laboratory Data:  Chemistry Recent Labs  Lab 05/16/18 1216  NA 132*  K 4.5  CL 97*  CO2 20*  GLUCOSE 317*  BUN 17  CREATININE 1.09  CALCIUM 9.3  GFRNONAA >60  GFRAA >60  ANIONGAP 15    No results for input(s): PROT, ALBUMIN, AST, ALT, ALKPHOS, BILITOT in the last 168 hours. Hematology Recent Labs  Lab 05/16/18 1216  WBC 6.5  RBC 5.07  HGB 15.1  HCT 45.1  MCV 89.0  MCH 29.8  MCHC 33.5  RDW 12.6  PLT 162   Cardiac Enzymes Recent Labs  Lab 05/16/18 1430 05/16/18 1659 05/16/18 2031  TROPONINI <0.03 <0.03 <0.03    Recent Labs  Lab 05/16/18 1228  TROPIPOC 0.00    BNPNo results for input(s): BNP, PROBNP in the last 168 hours.  DDimer No results for input(s): DDIMER in the last 168 hours.  Radiology/Studies:  Dg Chest Portable 1 View  Result Date: 05/16/2018 CLINICAL DATA:  Chest pain, shortness of breath and dizziness onset today. History of diabetes. EXAM: PORTABLE CHEST 1 VIEW COMPARISON:  None. FINDINGS: The heart size and mediastinal contours are within normal limits. Both lungs are clear. The visualized skeletal structures are unremarkable. IMPRESSION: No active disease.  No evidence of pneumonia or pulmonary edema. Electronically Signed   By: Bary Richard M.D.   On: 05/16/2018 12:44    Assessment and Plan:  Chest pain: -While outside watching an athletic event the patient broke out in a cold sweat, felt clammy and was dizzy with central chest tightness.  No shortness of breath. Lasted about an hour, resolved after aspirin given.   -Currently chest pain-free -CVD risk factors include obesity, hyperlipidemia, poorly controlled DM, OSA essentially untreated -Troponins have been negative X 3.  -EKG with borderline IVCD, no prior for comparison.  -CXR shows no active disease -Echocardiogram has been ordered to assess function, valves and wall motion. Can be done as outpatient -With no objective evidence of myocardial ischemia he can be discharged from a cardiology standpoint. He does have risk factors that are currently no well controlled. We advise an outpatient coronary CTA. He wants to follow with [facility] near his home and he is advised to do this. We will arrange for follow up at our office and CCTA in case he does not get into [facility]. He can cancel this if he does get seen at [facility].   Hyperlipidemia:  -On lovastatin 40 mg, previously tried lipitor, Zocor, Crestor.  -Uncontrolled with TC 230, Trig 592, Unable to calc LDL, HDL 28 in 10/2017 -The patient needs better blood sugar control to get his triglycerides down. -He follows at [facility].  DM type 2 on insulin:  -A1c was 10.0 on 05/12/18 previously 13.6 in 01/2018. Poorly controlled. -Target better control with A1c <7.  OSA -Pt has CPAP but only uses it rarely, when he is really tired. Advise regular use of CPAP for benefit and reduced future risk of organ damage.       For questions or updates, please contact [facility] Please consult [email or web address] for contact info under   Signed, Berton Bon, NP  05/17/2018 7:08 AM   I have seen and examined the patient along with Janine Hammond,NP.  I have reviewed the chart, notes and new data.  I agree with NP's note.  Key new complaints: Currently completely asymptomatic.  No chest pain.  Lying fully supine in bed without coughing or shortness of breath.  Episode of chest pain was prolonged, lasting for possibly an hour, cannot exclude unstable angina.  Reports intolerance to other statins including  simvastatin, atorvastatin and rosuvastatin.  Claims compliance with lovastatin. Key examination changes: Overweight, normal cardiovascular exam including normal pulses in feet, 2 mm puncture wound on the sole of the right foot at the level of the metatarsal heads, without surrounding redness or warmth. Key new findings / data: No ischemic changes on ECG, repeated normal troponin levels.  PLAN: Low risk for short-term major cardiovascular event, but could well have severe underlying CAD in the setting of poorly controlled long-standing diabetes mellitus and severe mixed hyperlipidemia. I think next best step in evaluation will be a coronary CT angiogram.  A nuclear perfusion study would be a second, less informative option, but he has eaten today. Okay for outpatient work-up, which he prefers to be performed at [facility]. We will set him up for coronary CT angiogram at [facility], with appropriate cardiology follow-up here in [place].  He prefers work-up performed via the [facility] since he lives in [place], [place].  I called his primary care provider's office (Dr. Jerre Simon) and spoke with her nurse Victorino Dike.  If Dr. Aundria Rud can arrange the necessary cardiology work-up and follow-up closer to home, we can cancel the tests ordered here in [place].  Thurmon Fair, MD, [facility] [phone] 05/17/2018, 9:28 AM

## 2018-05-20 ENCOUNTER — Telehealth: Payer: Self-pay | Admitting: Cardiovascular Disease

## 2018-05-20 NOTE — Telephone Encounter (Signed)
We had scheduled him for stress test f/u and clinic f/u in case he could not get in [facility]. If going to [facility] 10[facility], no need for f/u here. MCr

## 2018-05-20 NOTE — Telephone Encounter (Signed)
Follow Up:    Returning somebody call from today, does not  Know

## 2018-05-20 NOTE — Telephone Encounter (Signed)
Returned call to pt he states that he received a call and he thinks that this was just to make sure that he is following up at [facility]  d/t recent hospitalization-  [facility] 05/16/2018 - 05/17/2018 (24 hours). He states that he has an app[facility] 10[facility] [place] appt is at 220pm.  I do not see that anyone here called him, I will forward to Dr C and nurse and send a note to scheduling to make them aware

## 2018-10-08 ENCOUNTER — Encounter: Payer: Self-pay | Admitting: Cardiovascular Disease

## 2018-10-08 ENCOUNTER — Ambulatory Visit: Payer: Medicare Other | Admitting: Cardiovascular Disease

## 2018-10-08 VITALS — BP 134/94 | HR 79 | Ht 67.0 in | Wt 200.0 lb

## 2018-10-08 DIAGNOSIS — E1149 Type 2 diabetes mellitus with other diabetic neurological complication: Secondary | ICD-10-CM

## 2018-10-08 DIAGNOSIS — R079 Chest pain, unspecified: Secondary | ICD-10-CM | POA: Diagnosis not present

## 2018-10-08 DIAGNOSIS — Z8673 Personal history of transient ischemic attack (TIA), and cerebral infarction without residual deficits: Secondary | ICD-10-CM

## 2018-10-08 DIAGNOSIS — G8929 Other chronic pain: Secondary | ICD-10-CM

## 2018-10-08 DIAGNOSIS — R0602 Shortness of breath: Secondary | ICD-10-CM

## 2018-10-08 DIAGNOSIS — G4733 Obstructive sleep apnea (adult) (pediatric): Secondary | ICD-10-CM

## 2018-10-08 DIAGNOSIS — IMO0002 Reserved for concepts with insufficient information to code with codable children: Secondary | ICD-10-CM

## 2018-10-08 DIAGNOSIS — I10 Essential (primary) hypertension: Secondary | ICD-10-CM

## 2018-10-08 DIAGNOSIS — Z9189 Other specified personal risk factors, not elsewhere classified: Secondary | ICD-10-CM

## 2018-10-08 DIAGNOSIS — E1165 Type 2 diabetes mellitus with hyperglycemia: Secondary | ICD-10-CM

## 2018-10-08 DIAGNOSIS — E782 Mixed hyperlipidemia: Secondary | ICD-10-CM

## 2018-10-08 MED ORDER — METOPROLOL TARTRATE 50 MG PO TABS: ORAL_TABLET | ORAL | 0 refills | Status: DC

## 2018-10-08 NOTE — Patient Instructions (Addendum)
Medication Instructions:  Only take Metoprolol 50 mg once 1 hour before the CT exam. If you need a refill on your cardiac medications before your next appointment, please call your pharmacy.   Lab work: BMET (1 week before CT is scheduled) If you have labs (blood work) drawn today and your tests are completely normal, you will receive your results only by: Marland Kitchen MyChart Message (if you have MyChart) OR . A paper copy in the mail If you have any lab test that is abnormal or we need to change your treatment, we will call you to review the results.  Testing/Procedures: Your physician has requested that you have cardiac CT. Cardiac computed tomography (CT) is a painless test that uses an x-ray machine to take clear, detailed pictures of your heart. For further information please visit [email or web address] Please follow instruction sheet as given.   Echocardiogram (schedule as late in the day as possible) - Your physician has requested that you have an echocardiogram. Echocardiography is a painless test that uses sound waves to create images of your heart. It provides your doctor with information about the size and shape of your heart and how well your heart's chambers and valves are working. This procedure takes approximately one hour. There are no restrictions for this procedure. This will be performed at our [facility] location - [address].   Follow-Up: At [facility], you and your health needs are our priority.  As part of our continuing mission to provide you with exceptional heart care, we have created designated Provider Care Teams.  These Care Teams include your primary Cardiologist (physician) and Advanced Practice Providers (APPs -  Physician Assistants and Nurse Practitioners) who all work together to provide you with the care you need, when you need it. . Follow up first available after test  Any Other Special Instructions Will Be Listed Below (If  Applicable).    Please arrive at the [facility] main entrance of [facility] at xx:xx AM (30-45 minutes prior to test start time)  [facility] [address] [place], [place] [address] [phone]  Proceed to the [facility] Radiology Department (First Floor).  Please follow these instructions carefully (unless otherwise directed):  Hold all erectile dysfunction medications at least 48 hours prior to test.  On the Night Before the Test: . Be sure to Drink plenty of water. . Do not consume any caffeinated/decaffeinated beverages or chocolate 12 hours prior to your test. . Do not take any antihistamines 12 hours prior to your test. . If you take Metformin do not take 24 hours prior to test.  On the Day of the Test: . Drink plenty of water. Do not drink any water within one hour of the test. . Do not eat any food 4 hours prior to the test. . You may take your regular medications prior to the test.  . Take metoprolol (Lopressor) two hours prior to test. . HOLD Furosemide/Hydrochlorothiazide morning of the test.       After the Test: . Drink plenty of water. . After receiving IV contrast, you may experience a mild flushed feeling. This is normal. . On occasion, you may experience a mild rash up to 24 hours after the test. This is not dangerous. If this occurs, you can take Benadryl 25 mg and increase your fluid intake. . If you experience trouble breathing, this can be serious. If it is severe call 911 IMMEDIATELY. If it is mild, please  call our office. . If you take any of these medications: Glipizide/Metformin, Avandament, Glucavance, please do not take 48 hours after completing test.

## 2018-10-08 NOTE — Progress Notes (Signed)
Cardiology Consultation Note:    Date:  10/10/2018   ID:  Eric [name], DOB 01/29/1961, MRN [id number]  PCP:  Rudi CocoMarlowe-Rogers, Hidi, MD  Cardiologist:  Thurmon FairMihai Iyanla Eilers, MD  Electrophysiologist:  None   Referring MD: Rudi CocoMarlowe-Rogers, Hidi, MD   Chief Complaint  Patient presents with  . Advice Only    chest pain and exertional dyspnea  Eric [name] [name] is a 58 y.o. male who is being seen today for the evaluation of chest pain and dyspnea at the request of Marlowe-Rogers, Hidi, MD.  History of Present Illness:    Eric [name] Savary is a 58 y.o. male with a hx of type 2 diabetes mellitus, poorly controlled and complicated by polyneuropathy, hyperlipidemia, fatty liver, history of TIA, gastroesophageal reflux disease.  He was seen in the [facility] emergency room on May 17, 2018 with complaints of sharp substernal chest pain accompanied by nausea and diaphoresis.  His work-up was low risk and he was discharged with plan for outpatient evaluation.  He underwent a pharmacological nuclear stress test [facility] in October 2019 with normal results.  Exercise is limited by severe lower extremity neuropathy.  He is now returning for evaluation since he continues to have chest pain.  He also describes exertional dyspnea, which in my opinion is a much more severe complaint.  He is unable to climb a flight of stairs without stopping to catch his breath.  He is very sedentary.  He does not have orthopnea or PND.  He denies lower extremity edema.  He was a Psychologist, occupationalwelder for many years.  In addition he complains of chest pain that occurs at rest and last for about 10 to 15 seconds.  It is not related to meals.  It is not worsened by lying down.  In fact he often can alleviate the pain by hugging his chest with both arms and lying down in bed.  He has not taken nitroglycerin.  The pain has never occurred during physical activity.  She has obstructive sleep apnea and uses CPAP about 50% of the time.  He  reports waking up halfway through the night with a very dry mouth and can no longer use it.  He does have a humidifier in line on his CPAP equipment.  He does have some daytime hypersomnolence.  He scores 8 points on the Epworth Sleepiness Scale.  His wife has witnessed apnea.  His blood pressure was elevated in the office today but he reports that at home it is consistently in the 120-130/70-80 range.  Most recent labs performed on December 18 are as follows:  total cholesterol 203, triglycerides 569, HDL 26 TSH 1.18, potassium 3.9, creatinine 1.0, ALT mildly elevated at 56, AST 38 Hemoglobin A1c 12.8%  Past Medical History:  Diagnosis Date  . Diabetes mellitus without complication (HCC)   . Fatty liver   . Hyperlipidemia   . Stroke [facility](HCC)     Past Surgical History:  Procedure Laterality Date  . CHOLECYSTECTOMY    . SHOULDER SURGERY Left     Current Medications: Current Meds  Medication Sig  . allopurinol (ZYLOPRIM) 300 MG tablet Take 300 mg by mouth daily.  Marland Kitchen. aspirin EC 325 MG EC tablet Take 1 tablet (325 mg total) by mouth daily.  . cyclobenzaprine (FLEXERIL) 10 MG tablet Take 10 mg by mouth 2 (two) times daily as needed for muscle spasms.  . diphenhydrAMINE (BENADRYL) 25 MG tablet Take 25 mg by mouth every 6 (six) hours as needed for itching.  .Marland Kitchen  gabapentin (NEURONTIN) 400 MG capsule Take 800 mg by mouth 3 (three) times daily.  Marland Kitchen. glipiZIDE (GLUCOTROL) 5 MG tablet Take 5 mg by mouth daily.  . insulin lispro protamine-lispro (HUMALOG 75/25 MIX) (75-25) 100 UNIT/ML SUSP injection Inject 30-35 Units into the skin See admin instructions. Inject 35 units in am and 30 units bedtime.  . lovastatin (MEVACOR) 40 MG tablet Take 40 mg by mouth daily.  . metFORMIN (GLUCOPHAGE) 1000 MG tablet Take 1,000 mg by mouth 2 (two) times daily with a meal.   . Multiple Vitamins-Minerals (MULTIVITAMIN WITH MINERALS) tablet Take 1 tablet by mouth daily.  Marland Kitchen. omeprazole (PRILOSEC) 20 MG capsule Take 40 mg  by mouth daily as needed (acid reflux).   Marland Kitchen. OVER THE COUNTER MEDICATION Take 2 tablets by mouth daily. Eye vitamin (bill-berry eye vision)     Allergies:   Bee venom and Lisinopril   Social History   Socioeconomic History  . Marital status: Married    Spouse name: Eric [name]   . Number of children: 3  . Years of education: Not on file  . Highest education level: Not on file  Occupational History  . Occupation: disability   Social Needs  . Financial resource strain: Not very hard  . Food insecurity:    Worry: Never true    Inability: Never true  . Transportation needs:    Medical: No    Non-medical: No  Tobacco Use  . Smoking status: Former Smoker    Packs/day: 1.00    Years: 12.00    Pack years: 12.00    Types: Cigarettes    Start date: 1968    Last attempt to quit: 1981    Years since quitting: 39.1  . Smokeless tobacco: Current User    Types: Chew  Substance and Sexual Activity  . Alcohol use: Yes    Comment: rare social couple of times a year  . Drug use: Never  . Sexual activity: Not on file  Lifestyle  . Physical activity:    Days per week: 0 days    Minutes per session: 0 min  . Stress: Not at all  Relationships  . Social connections:    Talks on phone: More than three times a week    Gets together: More than three times a week    Attends religious service: More than 4 times per year    Active member of club or organization: No    Attends meetings of clubs or organizations: Never    Relationship status: Married  Other Topics Concern  . Not on file  Social History Narrative   Smoked from 1st grade to 12th grade then quit  Does use snuff  Family History: The patient's family history includes Heart attack in his father; Heart failure in his father; Hyperlipidemia in his mother; Hypothyroidism in his mother. His father passed away at age 58 from congestive heart failure.  ROS:   Please see the history of present illness.     All other systems reviewed  and are negative.  EKGs/Labs/Other Studies Reviewed:    The following studies were reviewed today: Nuclear stress test report from October, labs from December, emergency room visit notes from September 2019.  EKG:  EKG is  ordered today.  The ekg ordered today demonstrates normal sinus rhythm, normal tracing, QTC 447 ms  Recent Labs: 05/16/2018: BUN 17; Creatinine, Ser 1.09; Hemoglobin 15.1; Platelets 162; Potassium 4.5; Sodium 132  Recent Lipid Panel August 18 2018 Total cholesterol 203, triglycerides  569, HDL 26 TSH 1.18, potassium 3.9, creatinine 1.0, ALT mildly elevated at 56, AST 38 Hemoglobin A1c 12.8%  Physical Exam:    VS:  BP (!) 134/94   Pulse 79   Ht 5\' 7"  (1.702 m)   Wt 200 lb (90.7 kg)   BMI 31.32 kg/m     Wt Readings from Last 3 Encounters:  10/08/18 200 lb (90.7 kg)  05/16/18 207 lb (93.9 kg)     GEN: Mildly obese, well nourished, well developed in no acute distress HEENT: Normal NECK: No JVD; No carotid bruits LYMPHATICS: No lymphadenopathy CARDIAC: RRR, no murmurs, rubs, gallops RESPIRATORY:  Clear to auscultation without rales, wheezing or rhonchi  ABDOMEN: Soft, non-tender, non-distended MUSCULOSKELETAL:  No edema; No deformity  SKIN: Warm and dry NEUROLOGIC:  Alert and oriented x 3 PSYCHIATRIC:  Normal affect   ASSESSMENT:    1. Shortness of breath   2. Chronic chest pain with high risk for CAD   3. DM (diabetes mellitus), type 2, uncontrolled w/neurologic complication (HCC)   4. Mixed hyperlipidemia   5. Essential hypertension   6. OSA (obstructive sleep apnea)   7. History of transient ischemic attack (TIA)    PLAN:    In order of problems listed above:  1. Dyspnea: Chest x-ray in September 2019.  Symptoms are concerning for either diastolic heart failure or pulmonary hypertension due to insufficiently treated sleep apnea.  We will check an echocardiogram. 2. Chest pain: Current complaints are atypical and do not sound like angina.  On  the other hand he is at very high risk for diffuse CAD and could have a "false negative" nuclear stress test due to balanced ischemia.  Recommend coronary CT angiogram. 3. DM: Appears to be longstanding, is poorly controlled and is complicated by neuropathy.  High risk for diffuse vascular disease.  Most recent hemoglobin A1c 12.8% despite the use of insulin.  Might benefit from addition of SGLT2 inhibitor such as Jardiance.  Thankfully has normal renal function.  No evidence for microalbuminuria on labs from 2019.  He has a history of cough with lisinopril but could benefit from use of an ARB to protect from nephropathy. 4. HLP: Hypertriglyceridemia is related to poor diet and poor glycemic control.  Focus on improved glucose control before adding more medications.  Continue statin. 5. HTN: Blood pressure readings seem to be in target range.  Today may be an exception but will have to review. 6. OSA: Documented 100% compliance with CPAP.  He does have residual hypersomnolence.  His dry mouth might be a complication of severe glucosuria and dehydration. 7. History of TIA: Probably explains why he is on the high dose of aspirin, but we might be able to drop this to the 81 mg dose at this point time.   Medication Adjustments/Labs and Tests Ordered: Current medicines are reviewed at length with the patient today.  Concerns regarding medicines are outlined above.  Orders Placed This Encounter  Procedures  . CT CORONARY MORPH W/CTA COR W/SCORE W/CA W/CM &/OR WO/CM  . CT CORONARY FRACTIONAL FLOW RESERVE DATA PREP  . CT CORONARY FRACTIONAL FLOW RESERVE FLUID ANALYSIS  . Basic metabolic panel  . EKG 12-Lead  . ECHOCARDIOGRAM COMPLETE   Meds ordered this encounter  Medications  . DISCONTD: metoprolol tartrate (LOPRESSOR) 50 MG tablet    Sig: Take 1 tablet by mouth once for procedure.    Dispense:  1 tablet    Refill:  0  . metoprolol tartrate (LOPRESSOR) 50  MG tablet    Sig: Take 2 tablet (100 mg)  by mouth once for procedure.    Dispense:  2 tablet    Refill:  0    Disregard the 50 mg only tab.    Patient Instructions  Medication Instructions:  Only take Metoprolol 50 mg once 1 hour before the CT exam. If you need a refill on your cardiac medications before your next appointment, please call your pharmacy.   Lab work: BMET (1 week before CT is scheduled) If you have labs (blood work) drawn today and your tests are completely normal, you will receive your results only by: Marland Kitchen MyChart Message (if you have MyChart) OR . A paper copy in the mail If you have any lab test that is abnormal or we need to change your treatment, we will call you to review the results.  Testing/Procedures: Your physician has requested that you have cardiac CT. Cardiac computed tomography (CT) is a painless test that uses an x-ray machine to take clear, detailed pictures of your heart. For further information please visit [email or web address] Please follow instruction sheet as given.   Echocardiogram (schedule as late in the day as possible) - Your physician has requested that you have an echocardiogram. Echocardiography is a painless test that uses sound waves to create images of your heart. It provides your doctor with information about the size and shape of your heart and how well your heart's chambers and valves are working. This procedure takes approximately one hour. There are no restrictions for this procedure. This will be performed at our [facility] location - [address], [address].   Follow-Up: At [facility], you and your health needs are our priority.  As part of our continuing mission to provide you with exceptional heart care, we have created designated Provider Care Teams.  These Care Teams include your primary Cardiologist (physician) and Advanced Practice Providers (APPs -  Physician Assistants and Nurse Practitioners) who all work together to provide you with the care you need, when you  need it. . Follow up first available after test  Any Other Special Instructions Will Be Listed Below (If Applicable).    Please arrive at the [facility] main entrance of [facility] at xx:xx AM (30-45 minutes prior to test start time)  [facility] [address] [place], [place] [address] [phone]  Proceed to the [facility] Radiology Department (First Floor).  Please follow these instructions carefully (unless otherwise directed):  Hold all erectile dysfunction medications at least 48 hours prior to test.  On the Night Before the Test: . Be sure to Drink plenty of water. . Do not consume any caffeinated/decaffeinated beverages or chocolate 12 hours prior to your test. . Do not take any antihistamines 12 hours prior to your test. . If you take Metformin do not take 24 hours prior to test.  On the Day of the Test: . Drink plenty of water. Do not drink any water within one hour of the test. . Do not eat any food 4 hours prior to the test. . You may take your regular medications prior to the test.  . Take metoprolol (Lopressor) two hours prior to test. . HOLD Furosemide/Hydrochlorothiazide morning of the test.       After the Test: . Drink plenty of water. . After receiving IV contrast, you may experience a mild flushed feeling. This is normal. . On occasion, you may experience a mild rash up to  24 hours after the test. This is not dangerous. If this occurs, you can take Benadryl 25 mg and increase your fluid intake. . If you experience trouble breathing, this can be serious. If it is severe call 911 IMMEDIATELY. If it is mild, please call our office. . If you take any of these medications: Glipizide/Metformin, Avandament, Glucavance, please do not take 48 hours after completing test.        Signed, Thurmon Fair, MD  10/10/2018 10:47 AM    [facility]

## 2018-10-10 ENCOUNTER — Encounter: Payer: Self-pay | Admitting: Cardiovascular Disease

## 2018-10-10 DIAGNOSIS — E1149 Type 2 diabetes mellitus with other diabetic neurological complication: Secondary | ICD-10-CM | POA: Insufficient documentation

## 2018-10-10 DIAGNOSIS — IMO0002 Reserved for concepts with insufficient information to code with codable children: Secondary | ICD-10-CM | POA: Insufficient documentation

## 2018-10-10 DIAGNOSIS — E782 Mixed hyperlipidemia: Secondary | ICD-10-CM | POA: Insufficient documentation

## 2018-10-10 DIAGNOSIS — Z8673 Personal history of transient ischemic attack (TIA), and cerebral infarction without residual deficits: Secondary | ICD-10-CM | POA: Insufficient documentation

## 2018-10-10 DIAGNOSIS — E1165 Type 2 diabetes mellitus with hyperglycemia: Secondary | ICD-10-CM | POA: Insufficient documentation

## 2018-10-10 DIAGNOSIS — G4733 Obstructive sleep apnea (adult) (pediatric): Secondary | ICD-10-CM | POA: Insufficient documentation

## 2018-10-10 DIAGNOSIS — I10 Essential (primary) hypertension: Secondary | ICD-10-CM | POA: Insufficient documentation

## 2018-10-14 ENCOUNTER — Ambulatory Visit (HOSPITAL_COMMUNITY): Payer: Medicare Other | Attending: Cardiology

## 2018-10-14 DIAGNOSIS — R079 Chest pain, unspecified: Secondary | ICD-10-CM | POA: Diagnosis present

## 2018-10-14 DIAGNOSIS — R0602 Shortness of breath: Secondary | ICD-10-CM

## 2018-10-14 DIAGNOSIS — G8929 Other chronic pain: Secondary | ICD-10-CM | POA: Diagnosis present

## 2018-10-14 DIAGNOSIS — Z9189 Other specified personal risk factors, not elsewhere classified: Secondary | ICD-10-CM

## 2018-10-29 ENCOUNTER — Telehealth: Payer: Self-pay

## 2018-10-29 LAB — BASIC METABOLIC PANEL
BUN/Creatinine Ratio: 10 (ref 9–20)
BUN: 14 mg/dL (ref 6–24)
CALCIUM: 9.4 mg/dL (ref 8.7–10.2)
CHLORIDE: 96 mmol/L (ref 96–106)
CO2: 19 mmol/L — ABNORMAL LOW (ref 20–29)
Creatinine, Ser: 1.37 mg/dL — ABNORMAL HIGH (ref 0.76–1.27)
GFR calc non Af Amer: 57 mL/min/{1.73_m2} — ABNORMAL LOW (ref >59)
GFR, EST AFRICAN AMERICAN: 66 mL/min/{1.73_m2} (ref >59)
Glucose: 555 mg/dL (ref 65–99)
POTASSIUM: 4.3 mmol/L (ref 3.5–5.2)
Sodium: 136 mmol/L (ref 134–144)

## 2018-10-29 LAB — SPECIMEN STATUS REPORT

## 2018-10-29 NOTE — Telephone Encounter (Signed)
Eric [name] with [facility] called to report that the pt had labs drawn yesterday and his Glucose was 555.... spoke with the pt and he reports that he drank a lot of sweet tea prior to the lab draw and he is aware that his sugar runs high... he says he feels well otherwise..  I called his PMD.. Dr. Osborne Oman and spoke with Toniann Fail and she is going to let the MD know about the pts high glucose.. he was seen there 10/21/18. Pt having CT and then seeing Dr. Royann Shivers 11/05/18. Will view rest of his BMET as it comes available.

## 2018-11-02 ENCOUNTER — Telehealth (HOSPITAL_COMMUNITY): Payer: Self-pay | Admitting: Emergency Medicine

## 2018-11-02 NOTE — Telephone Encounter (Signed)
Left message on voicemail with name and callback number Jadian Karman RN Navigator Cardiac Imaging [facility] [phone] Office [phone] Cell  

## 2018-11-04 ENCOUNTER — Ambulatory Visit (HOSPITAL_COMMUNITY)
Admission: RE | Admit: 2018-11-04 | Discharge: 2018-11-04 | Disposition: A | Discharge status: Home / Self Care | Payer: Medicare Other | Source: Ambulatory Visit | Attending: Cardiovascular Disease | Admitting: Cardiovascular Disease

## 2018-11-04 DIAGNOSIS — R0602 Shortness of breath: Secondary | ICD-10-CM

## 2018-11-04 DIAGNOSIS — Z9189 Other specified personal risk factors, not elsewhere classified: Secondary | ICD-10-CM | POA: Insufficient documentation

## 2018-11-04 DIAGNOSIS — G8929 Other chronic pain: Secondary | ICD-10-CM | POA: Diagnosis present

## 2018-11-04 DIAGNOSIS — I25118 Atherosclerotic heart disease of native coronary artery with other forms of angina pectoris: Secondary | ICD-10-CM | POA: Diagnosis not present

## 2018-11-04 DIAGNOSIS — R079 Chest pain, unspecified: Secondary | ICD-10-CM

## 2018-11-04 DIAGNOSIS — I2510 Atherosclerotic heart disease of native coronary artery without angina pectoris: Secondary | ICD-10-CM | POA: Insufficient documentation

## 2018-11-04 MED ORDER — IOHEXOL 350 MG/ML SOLN
80.0000 mL | Freq: Once | INTRAVENOUS | Status: AC | PRN
  Administered 2018-11-04: 80 mL via INTRAVENOUS

## 2018-11-04 MED ORDER — NITROGLYCERIN 0.4 MG SL SUBL
0.8000 mg | SUBLINGUAL_TABLET | Freq: Once | SUBLINGUAL | Status: AC
  Administered 2018-11-04: 0.8 mg via SUBLINGUAL
  Filled 2018-11-04: qty 25

## 2018-11-04 MED ORDER — NITROGLYCERIN 0.4 MG SL SUBL
SUBLINGUAL_TABLET | SUBLINGUAL | Status: AC
  Filled 2018-11-04: qty 2

## 2018-11-04 NOTE — Progress Notes (Signed)
CT complete. Patient denies any complaints. Offered patient snack and beverage. 

## 2018-11-04 NOTE — Progress Notes (Signed)
Patient ambulatory out of department with steady gait noted. Denies any complaints.  

## 2018-11-05 ENCOUNTER — Encounter: Payer: Self-pay | Admitting: Cardiovascular Disease

## 2018-11-05 ENCOUNTER — Ambulatory Visit: Payer: Medicare Other | Admitting: Cardiovascular Disease

## 2018-11-05 VITALS — BP 102/74 | HR 71 | Ht 66.0 in | Wt 199.6 lb

## 2018-11-05 DIAGNOSIS — E782 Mixed hyperlipidemia: Secondary | ICD-10-CM | POA: Diagnosis not present

## 2018-11-05 DIAGNOSIS — I5032 Chronic diastolic (congestive) heart failure: Secondary | ICD-10-CM

## 2018-11-05 DIAGNOSIS — E1165 Type 2 diabetes mellitus with hyperglycemia: Secondary | ICD-10-CM | POA: Diagnosis not present

## 2018-11-05 DIAGNOSIS — E669 Obesity, unspecified: Secondary | ICD-10-CM

## 2018-11-05 DIAGNOSIS — I10 Essential (primary) hypertension: Secondary | ICD-10-CM

## 2018-11-05 DIAGNOSIS — I25118 Atherosclerotic heart disease of native coronary artery with other forms of angina pectoris: Secondary | ICD-10-CM

## 2018-11-05 DIAGNOSIS — R931 Abnormal findings on diagnostic imaging of heart and coronary circulation: Secondary | ICD-10-CM

## 2018-11-05 DIAGNOSIS — G4733 Obstructive sleep apnea (adult) (pediatric): Secondary | ICD-10-CM

## 2018-11-05 DIAGNOSIS — Z8673 Personal history of transient ischemic attack (TIA), and cerebral infarction without residual deficits: Secondary | ICD-10-CM

## 2018-11-05 NOTE — H&P (View-Only) (Signed)
Cardiology Office Note:    Date:  11/06/2018   ID:  Eric [name], DOB [birth year], MRN [id number]  PCP:  Rudi Coco, MD  Cardiologist:  Thurmon Fair, MD  Electrophysiologist:  None   Referring MD: Rudi Coco, MD   Chief Complaint  Patient presents with  . Coronary Artery Disease    History of Present Illness:    Eric [name] is a 58 y.o. male with a hx of type 2 diabetes mellitus, poorly controlled and complicated by polyneuropathy, hyperlipidemia, fatty liver, history of TIA, gastroesophageal reflux disease.  He was seen in the [facility] emergency room on May 17, 2018 with complaints of sharp substernal chest pain accompanied by nausea and diaphoresis.  His work-up was low risk and he was discharged with plan for outpatient evaluation.  He underwent a pharmacological nuclear stress test at [facility] in October 2019 with normal results.  Exercise is limited by severe lower extremity neuropathy.  He continues to complain of chest pain and worsening exertional dyspnea and we schedule him for coronary CT angiogram, performed yesterday.  This study shows a severe stenosis of the ostial LAD artery with high risk features, lots of soft plaque, eccentric lesion.    He has a burning sensation in his right calf, but no pruritus and no visible rash.  Reports that it started after his CT, but it sounds neuropathic.  He has obstructive sleep apnea and uses CPAP about 50% of the time.   His blood pressure is much lower today than at his previous appointment  Most recent labs performed on December 18 are as follows:  total cholesterol 203, triglycerides 569, HDL 26 TSH 1.18, potassium 3.9, creatinine 1.0, ALT mildly elevated at 56, AST 38 Hemoglobin A1c 12.8%.  Past Medical History:  Diagnosis Date  . Diabetes mellitus without complication (HCC)   . Fatty liver   . Hyperlipidemia   . Stroke [facility])     Past Surgical History:  Procedure Laterality Date  .  CHOLECYSTECTOMY    . SHOULDER SURGERY Left     Current Medications: Current Meds  Medication Sig  . ACCU-CHEK AVIVA PLUS test strip   . albuterol (PROVENTIL HFA;VENTOLIN HFA) 108 (90 Base) MCG/ACT inhaler Inhale into the lungs.  Marland Kitchen allopurinol (ZYLOPRIM) 300 MG tablet Take 300 mg by mouth daily.  . Bilberry, Vaccinium myrtillus, (BILBERRY FRUIT PO) Take 1 tablet by mouth daily.  . cyclobenzaprine (FLEXERIL) 10 MG tablet Take 10 mg by mouth 2 (two) times daily as needed for muscle spasms.  . diphenhydrAMINE (BENADRYL) 25 MG tablet Take 25 mg by mouth every 6 (six) hours as needed for itching.  . gabapentin (NEURONTIN) 400 MG capsule Take 800 mg by mouth 3 (three) times daily.  Marland Kitchen glipiZIDE (GLUCOTROL) 5 MG tablet Take 5 mg by mouth daily.  Marland Kitchen HYDROcodone-acetaminophen (NORCO/VICODIN) 5-325 MG tablet Take by mouth.  . insulin lispro protamine-lispro (HUMALOG 75/25 MIX) (75-25) 100 UNIT/ML SUSP injection Inject 30-35 Units into the skin See admin instructions. Inject 35 units in am and 30 units bedtime.  [name] Devices (ADJUSTABLE LANCING DEVICE) MISC   . Lancet Devices (ADJUSTABLE LANCING DEVICE) MISC   . lovastatin (MEVACOR) 40 MG tablet Take 40 mg by mouth daily.  . metFORMIN (GLUCOPHAGE) 1000 MG tablet Take 1,000 mg by mouth 2 (two) times daily with a meal.   . metoprolol tartrate (LOPRESSOR) 50 MG tablet Take 2 tablet (100 mg) by mouth once for procedure.  . Multiple Vitamins-Minerals (MULTIVITAMIN WITH MINERALS) tablet Take  1 tablet by mouth daily.  Marland Kitchen. omeprazole (PRILOSEC) 20 MG capsule Take 40 mg by mouth daily as needed (acid reflux).   Marland Kitchen. OVER THE COUNTER MEDICATION Take 2 tablets by mouth daily. Eye vitamin (bill-berry eye vision)  . Saw Palmetto, Serenoa repens, 450 MG CAPS Take by mouth.  . [DISCONTINUED] aspirin EC 325 MG EC tablet Take 1 tablet (325 mg total) by mouth daily.     Allergies:   Bee venom and Lisinopril   Social History   Socioeconomic History  . Marital  status: Married    Spouse name: Eric [name]   . Number of children: 3  . Years of education: Not on file  . Highest education level: Not on file  Occupational History  . Occupation: disability   Social Needs  . Financial resource strain: Not very hard  . Food insecurity:    Worry: Never true    Inability: Never true  . Transportation needs:    Medical: No    Non-medical: No  Tobacco Use  . Smoking status: Former Smoker    Packs/day: 1.00    Years: 12.00    Pack years: 12.00    Types: Cigarettes    Start date: 1968    Last attempt to quit: 1981    Years since quitting: 39.2  . Smokeless tobacco: Current User    Types: Chew  Substance and Sexual Activity  . Alcohol use: Yes    Comment: rare social couple of times a year  . Drug use: Never  . Sexual activity: Not on file  Lifestyle  . Physical activity:    Days per week: 0 days    Minutes per session: 0 min  . Stress: Not at all  Relationships  . Social connections:    Talks on phone: More than three times a week    Gets together: More than three times a week    Attends religious service: More than 4 times per year    Active member of club or organization: No    Attends meetings of clubs or organizations: Never    Relationship status: Married  Other Topics Concern  . Not on file  Social History Narrative   Smoked from 1st grade to 12th grade then quit  Does use snuff  Family History: The patient's family history includes Heart attack in his father; Heart failure in his father; Hyperlipidemia in his mother; Hypothyroidism in his mother. His father passed away at age 58 from congestive heart failure.  ROS:   Please see the history of present illness.    Continues to complain of dry mouth and fatigue  EKGs/Labs/Other Studies Reviewed:    The following studies were reviewed today: Coronary CT angiography with FFR  EKG:  EKG is not ordered today.  The ekg ordered at his previous appointment demonstrates normal sinus  rhythm, normal tracing, QTC 447 ms  Recent Labs: 11/05/2018: BUN 21; Creatinine, Ser 1.17; Hemoglobin 17.1; Platelets 194; Potassium 4.5; Sodium 134  Recent Lipid Panel August 18 2018 Total cholesterol 203, triglycerides 569, HDL 26 TSH 1.18, potassium 3.9, creatinine 1.0, ALT mildly elevated at 56, AST 38 Hemoglobin A1c 12.8% Lipid Panel     Component Value Date/Time   CHOL 228 (H) 11/05/2018 1607   TRIG 587 (HH) 11/05/2018 1607   HDL 33 (L) 11/05/2018 1607   CHOLHDL 6.9 (H) 11/05/2018 1607   LDLCALC Comment 11/05/2018 1607     Physical Exam:    VS:  BP 102/74   Pulse  71   Ht  (1.676 m)   Wt 199 lb 9.6 oz (90.5 kg)   SpO2 97%   BMI 32.22 kg/m     Wt Readings from Last 3 Encounters:  11/05/18 199 lb 9.6 oz (90.5 kg)  10/08/18 200 lb (90.7 kg)  05/16/18 207 lb (93.9 kg)     General: Alert, oriented x3, no distress, mildly obese Head: no evidence of trauma, PERRL, EOMI, no exophtalmos or lid lag, no myxedema, no xanthelasma; normal ears, nose and oropharynx Neck: normal jugular venous pulsations and no hepatojugular reflux; brisk carotid pulses without delay and no carotid bruits Chest: clear to auscultation, no signs of consolidation by percussion or palpation, normal fremitus, symmetrical and full respiratory excursions Cardiovascular: normal position and quality of the apical impulse, regular rhythm, normal first and second heart sounds, no murmurs, rubs or gallops Abdomen: no tenderness or distention, no masses by palpation, no abnormal pulsatility or arterial bruits, normal bowel sounds, no hepatosplenomegaly Extremities: no clubbing, cyanosis or edema; 2+ radial, ulnar and brachial pulses bilaterally; 2+ right femoral, posterior tibial and dorsalis pedis pulses; 2+ left femoral, posterior tibial and dorsalis pedis pulses; no subclavian or femoral bruits Neurological: grossly nonfocal Psych: Normal mood and affect   ASSESSMENT:    1. Essential hypertension     2. Abnormal CT scan, heart    PLAN:    In order of problems listed above:  1. CAD: Coronary angiogram shows a very concerning ostial LAD lesion with high risk features.  He is currently asymptomatic.  We will schedule him for coronary angiography early next week. The diagnostic procedure as well as possible angioplasty-stent has been fully reviewed with the patient and informed consent has been obtained. 2. Dyspnea: Chest x-ray in September 2019 and his symptoms are concerning for either diastolic heart failure.  Also need to consider possibility of pulmonary hypertension due to insufficiently treated sleep apnea.  His echo shows normal left ventricular systolic function and mild diastolic dysfunction.  Unable to calculate the PA pressure. 3. DM:  longstanding, is poorly controlled and is complicated by neuropathy.  High risk for diffuse vascular disease.  Most recent hemoglobin A1c 12.8% despite the use of insulin.  Would benefit from addition of SGLT2 inhibitor such as Jardiance - asked our research nurse to see if he meets criteria for COORDINATE.  Thankfully has normal renal function.  No evidence for microalbuminuria on labs from 2019.  He has a history of cough with lisinopril but could benefit from use of an ARB to protect from nephropathy (start after cath). 4. HLP: Hypertriglyceridemia is related to poor diet and poor glycemic control.  Focus on improved glucose control before adding more medications.  Continue statin - plan to change to high dose atorvastatin if he has PCI.. 5. HTN: Blood pressure readings are rather variable and this may be related to his periods of hypovolemia due to severe glucosuria.  No changes to medicines today.  Try to start ARB after cath, if his blood pressure allows. 6. OSA: recommend 100% compliance with CPAP.  He does have residual hypersomnolence.  His dry mouth might be a complication of severe glucosuria and dehydration. 7. History of TIA: Probably explains  why he is on the high dose of aspirin, but anticipate PCI with need for dual antiplatelet therapy so we will drop the dose to 81 mg daily.   Medication Adjustments/Labs and Tests Ordered: Current medicines are reviewed at length with the patient today.  Concerns regarding medicines  are outlined above.  Orders Placed This Encounter  Procedures  . Basic metabolic panel  . CBC w/Diff/Platelet  . Lipid panel  . Lipid panel   No orders of the defined types were placed in this encounter.   Patient Instructions     [facility] CARDIOVASCULAR DIVISION [facility] [address] [place] [place] [address] Dept: [phone] Loc: [phone]  Joban Bhuiyan  11/05/2018  You are scheduled for a Cardiac Cath on Tuesday 11/09/18,  with Dr.Tyrae.  1. Please arrive at the [facility] (Main Entrance A) at [facility]: [address] [place], [place] [address] at 10:00 am (This time is two hours before your procedure to ensure your preparation). Free valet parking service is available.   Special note: Every effort is made to have your procedure done on time. Please understand that emergencies sometimes delay scheduled procedures.  2. Diet: Do not eat solid foods after midnight.  The patient may have clear liquids until 5am upon the day of the procedure.  3. Labs: You will need to have blood drawn on Friday 11/05/18 You do not need to be fasting.  4. Medication instructions in preparation for your procedure:      Hold Metformin morning of cath and Hold 2 days after cath.     Hold Glipizide morning of cath     Take 1/2 insulin dose night before cath and hold morning insulin dose        On the morning of your procedure, take your Aspirin 81 mg and any morning medicines NOT listed above.  You may use sips of water.  5. Plan for one night stay--bring personal belongings. 6. Bring a current list of your medications and current insurance  cards. 7. You MUST have a responsible person to drive you home. 8. Someone MUST be with you the first 24 hours after you arrive home or your discharge will be delayed. 9. Please wear clothes that are easy to get on and off and wear slip-on shoes.  Thank you for allowing [place] to care for you!   -- [facility]     Signed, Thurmon Fair, MD  11/06/2018 9:34 AM    [facility]

## 2018-11-05 NOTE — Progress Notes (Signed)
Cardiology Office Note:    Date:  11/06/2018   ID:  Eric [name], DOB [birth year], MRN [id number]  PCP:  Rudi Coco, MD  Cardiologist:  Thurmon Fair, MD  Electrophysiologist:  None   Referring MD: Rudi Coco, MD   Chief Complaint  Patient presents with  . Coronary Artery Disease    History of Present Illness:    Eric [name] is a 58 y.o. male with a hx of type 2 diabetes mellitus, poorly controlled and complicated by polyneuropathy, hyperlipidemia, fatty liver, history of TIA, gastroesophageal reflux disease.  He was seen in the [facility] emergency room on May 17, 2018 with complaints of sharp substernal chest pain accompanied by nausea and diaphoresis.  His work-up was low risk and he was discharged with plan for outpatient evaluation.  He underwent a pharmacological nuclear stress test at [facility] in October 2019 with normal results.  Exercise is limited by severe lower extremity neuropathy.  He continues to complain of chest pain and worsening exertional dyspnea and we schedule him for coronary CT angiogram, performed yesterday.  This study shows a severe stenosis of the ostial LAD artery with high risk features, lots of soft plaque, eccentric lesion.    He has a burning sensation in his right calf, but no pruritus and no visible rash.  Reports that it started after his CT, but it sounds neuropathic.  He has obstructive sleep apnea and uses CPAP about 50% of the time.   His blood pressure is much lower today than at his previous appointment  Most recent labs performed on December 18 are as follows:  total cholesterol 203, triglycerides 569, HDL 26 TSH 1.18, potassium 3.9, creatinine 1.0, ALT mildly elevated at 56, AST 38 Hemoglobin A1c 12.8%.  Past Medical History:  Diagnosis Date  . Diabetes mellitus without complication (HCC)   . Fatty liver   . Hyperlipidemia   . Stroke [facility])     Past Surgical History:  Procedure Laterality Date  .  CHOLECYSTECTOMY    . SHOULDER SURGERY Left     Current Medications: Current Meds  Medication Sig  . ACCU-CHEK AVIVA PLUS test strip   . albuterol (PROVENTIL HFA;VENTOLIN HFA) 108 (90 Base) MCG/ACT inhaler Inhale into the lungs.  Marland Kitchen allopurinol (ZYLOPRIM) 300 MG tablet Take 300 mg by mouth daily.  . Bilberry, Vaccinium myrtillus, (BILBERRY FRUIT PO) Take 1 tablet by mouth daily.  . cyclobenzaprine (FLEXERIL) 10 MG tablet Take 10 mg by mouth 2 (two) times daily as needed for muscle spasms.  . diphenhydrAMINE (BENADRYL) 25 MG tablet Take 25 mg by mouth every 6 (six) hours as needed for itching.  . gabapentin (NEURONTIN) 400 MG capsule Take 800 mg by mouth 3 (three) times daily.  Marland Kitchen glipiZIDE (GLUCOTROL) 5 MG tablet Take 5 mg by mouth daily.  Marland Kitchen HYDROcodone-acetaminophen (NORCO/VICODIN) 5-325 MG tablet Take by mouth.  . insulin lispro protamine-lispro (HUMALOG 75/25 MIX) (75-25) 100 UNIT/ML SUSP injection Inject 30-35 Units into the skin See admin instructions. Inject 35 units in am and 30 units bedtime.  [name] Devices (ADJUSTABLE LANCING DEVICE) MISC   . Lancet Devices (ADJUSTABLE LANCING DEVICE) MISC   . lovastatin (MEVACOR) 40 MG tablet Take 40 mg by mouth daily.  . metFORMIN (GLUCOPHAGE) 1000 MG tablet Take 1,000 mg by mouth 2 (two) times daily with a meal.   . metoprolol tartrate (LOPRESSOR) 50 MG tablet Take 2 tablet (100 mg) by mouth once for procedure.  . Multiple Vitamins-Minerals (MULTIVITAMIN WITH MINERALS) tablet Take  1 tablet by mouth daily.  Marland Kitchen. omeprazole (PRILOSEC) 20 MG capsule Take 40 mg by mouth daily as needed (acid reflux).   Marland Kitchen. OVER THE COUNTER MEDICATION Take 2 tablets by mouth daily. Eye vitamin (bill-berry eye vision)  . Saw Palmetto, Serenoa repens, 450 MG CAPS Take by mouth.  . [DISCONTINUED] aspirin EC 325 MG EC tablet Take 1 tablet (325 mg total) by mouth daily.     Allergies:   Bee venom and Lisinopril   Social History   Socioeconomic History  . Marital  status: Married    Spouse name: Luanne BrasLizzie   . Number of children: 3  . Years of education: Not on file  . Highest education level: Not on file  Occupational History  . Occupation: disability   Social Needs  . Financial resource strain: Not very hard  . Food insecurity:    Worry: Never true    Inability: Never true  . Transportation needs:    Medical: No    Non-medical: No  Tobacco Use  . Smoking status: Former Smoker    Packs/day: 1.00    Years: 12.00    Pack years: 12.00    Types: Cigarettes    Start date: 1968    Last attempt to quit: 1981    Years since quitting: 39.2  . Smokeless tobacco: Current User    Types: Chew  Substance and Sexual Activity  . Alcohol use: Yes    Comment: rare social couple of times a year  . Drug use: Never  . Sexual activity: Not on file  Lifestyle  . Physical activity:    Days per week: 0 days    Minutes per session: 0 min  . Stress: Not at all  Relationships  . Social connections:    Talks on phone: More than three times a week    Gets together: More than three times a week    Attends religious service: More than 4 times per year    Active member of club or organization: No    Attends meetings of clubs or organizations: Never    Relationship status: Married  Other Topics Concern  . Not on file  Social History Narrative   Smoked from 1st grade to 12th grade then quit  Does use snuff  Family History: The patient's family history includes Heart attack in his father; Heart failure in his father; Hyperlipidemia in his mother; Hypothyroidism in his mother. His father passed away at age 58 from congestive heart failure.  ROS:   Please see the history of present illness.    Continues to complain of dry mouth and fatigue  EKGs/Labs/Other Studies Reviewed:    The following studies were reviewed today: Coronary CT angiography with FFR  EKG:  EKG is not ordered today.  The ekg ordered at his previous appointment demonstrates normal sinus  rhythm, normal tracing, QTC 447 ms  Recent Labs: 11/05/2018: BUN 21; Creatinine, Ser 1.17; Hemoglobin 17.1; Platelets 194; Potassium 4.5; Sodium 134  Recent Lipid Panel August 18 2018 Total cholesterol 203, triglycerides 569, HDL 26 TSH 1.18, potassium 3.9, creatinine 1.0, ALT mildly elevated at 56, AST 38 Hemoglobin A1c 12.8% Lipid Panel     Component Value Date/Time   CHOL 228 (H) 11/05/2018 1607   TRIG 587 (HH) 11/05/2018 1607   HDL 33 (L) 11/05/2018 1607   CHOLHDL 6.9 (H) 11/05/2018 1607   LDLCALC Comment 11/05/2018 1607     Physical Exam:    VS:  BP 102/74   Pulse  71   Ht 5' 6" (1.676 m)   Wt 199 lb 9.6 oz (90.5 kg)   SpO2 97%   BMI 32.22 kg/m     Wt Readings from Last 3 Encounters:  11/05/18 199 lb 9.6 oz (90.5 kg)  10/08/18 200 lb (90.7 kg)  05/16/18 207 lb (93.9 kg)     General: Alert, oriented x3, no distress, mildly obese Head: no evidence of trauma, PERRL, EOMI, no exophtalmos or lid lag, no myxedema, no xanthelasma; normal ears, nose and oropharynx Neck: normal jugular venous pulsations and no hepatojugular reflux; brisk carotid pulses without delay and no carotid bruits Chest: clear to auscultation, no signs of consolidation by percussion or palpation, normal fremitus, symmetrical and full respiratory excursions Cardiovascular: normal position and quality of the apical impulse, regular rhythm, normal first and second heart sounds, no murmurs, rubs or gallops Abdomen: no tenderness or distention, no masses by palpation, no abnormal pulsatility or arterial bruits, normal bowel sounds, no hepatosplenomegaly Extremities: no clubbing, cyanosis or edema; 2+ radial, ulnar and brachial pulses bilaterally; 2+ right femoral, posterior tibial and dorsalis pedis pulses; 2+ left femoral, posterior tibial and dorsalis pedis pulses; no subclavian or femoral bruits Neurological: grossly nonfocal Psych: Normal mood and affect   ASSESSMENT:    1. Essential hypertension     2. Abnormal CT scan, heart    PLAN:    In order of problems listed above:  1. CAD: Coronary angiogram shows a very concerning ostial LAD lesion with high risk features.  He is currently asymptomatic.  We will schedule him for coronary angiography early next week. The diagnostic procedure as well as possible angioplasty-stent has been fully reviewed with the patient and informed consent has been obtained. 2. Dyspnea: Chest x-ray in September 2019 and his symptoms are concerning for either diastolic heart failure.  Also need to consider possibility of pulmonary hypertension due to insufficiently treated sleep apnea.  His echo shows normal left ventricular systolic function and mild diastolic dysfunction.  Unable to calculate the PA pressure. 3. DM:  longstanding, is poorly controlled and is complicated by neuropathy.  High risk for diffuse vascular disease.  Most recent hemoglobin A1c 12.8% despite the use of insulin.  Would benefit from addition of SGLT2 inhibitor such as Jardiance - asked our research nurse to see if he meets criteria for COORDINATE.  Thankfully has normal renal function.  No evidence for microalbuminuria on labs from 2019.  He has a history of cough with lisinopril but could benefit from use of an ARB to protect from nephropathy (start after cath). 4. HLP: Hypertriglyceridemia is related to poor diet and poor glycemic control.  Focus on improved glucose control before adding more medications.  Continue statin - plan to change to high dose atorvastatin if he has PCI.. 5. HTN: Blood pressure readings are rather variable and this may be related to his periods of hypovolemia due to severe glucosuria.  No changes to medicines today.  Try to start ARB after cath, if his blood pressure allows. 6. OSA: recommend 100% compliance with CPAP.  He does have residual hypersomnolence.  His dry mouth might be a complication of severe glucosuria and dehydration. 7. History of TIA: Probably explains  why he is on the high dose of aspirin, but anticipate PCI with need for dual antiplatelet therapy so we will drop the dose to 81 mg daily.   Medication Adjustments/Labs and Tests Ordered: Current medicines are reviewed at length with the patient today.  Concerns regarding medicines   are outlined above.  Orders Placed This Encounter  Procedures  . Basic metabolic panel  . CBC w/Diff/Platelet  . Lipid panel  . Lipid panel   No orders of the defined types were placed in this encounter.   Patient Instructions     [facility] CARDIOVASCULAR DIVISION [facility] [address] [place] [address] [place] [address] Dept: [phone] Loc: [phone]  Joban Bhuiyan  11/05/2018  You are scheduled for a Cardiac Cath on Tuesday 11/09/18,  with Dr.Tyrae.  1. Please arrive at the [facility] (Main Entrance A) at [facility]: [address] [place], [place] [address] at 10:00 am (This time is two hours before your procedure to ensure your preparation). Free valet parking service is available.   Special note: Every effort is made to have your procedure done on time. Please understand that emergencies sometimes delay scheduled procedures.  2. Diet: Do not eat solid foods after midnight.  The patient may have clear liquids until 5am upon the day of the procedure.  3. Labs: You will need to have blood drawn on Friday 11/05/18 You do not need to be fasting.  4. Medication instructions in preparation for your procedure:      Hold Metformin morning of cath and Hold 2 days after cath.     Hold Glipizide morning of cath     Take 1/2 insulin dose night before cath and hold morning insulin dose        On the morning of your procedure, take your Aspirin 81 mg and any morning medicines NOT listed above.  You may use sips of water.  5. Plan for one night stay--bring personal belongings. 6. Bring a current list of your medications and current insurance  cards. 7. You MUST have a responsible person to drive you home. 8. Someone MUST be with you the first 24 hours after you arrive home or your discharge will be delayed. 9. Please wear clothes that are easy to get on and off and wear slip-on shoes.  Thank you for allowing [place] to care for you!   -- [place] [facility]     Signed, Thurmon Fair, MD  11/06/2018 9:34 AM    [facility]

## 2018-11-05 NOTE — Patient Instructions (Signed)
    [place] MEDICAL GROUP [facility] CARDIOVASCULAR DIVISION [facility] [address] [place] [address] [place] [address] Dept: [phone] Loc: [phone]  French Lambert  11/05/2018  You are scheduled for a Cardiac Cath on Tuesday 11/09/18,  with Dr.Hisham.  1. Please arrive at the [facility] (Main Entrance A) at [facility]: [address] [place], [place] [address] at 10:00 am (This time is two hours before your procedure to ensure your preparation). Free valet parking service is available.   Special note: Every effort is made to have your procedure done on time. Please understand that emergencies sometimes delay scheduled procedures.  2. Diet: Do not eat solid foods after midnight.  The patient may have clear liquids until 5am upon the day of the procedure.  3. Labs: You will need to have blood drawn on Friday 11/05/18 You do not need to be fasting.  4. Medication instructions in preparation for your procedure:      Hold Metformin morning of cath and Hold 2 days after cath.     Hold Glipizide morning of cath     Take 1/2 insulin dose night before cath and hold morning insulin dose        On the morning of your procedure, take your Aspirin 81 mg and any morning medicines NOT listed above.  You may use sips of water.  5. Plan for one night stay--bring personal belongings. 6. Bring a current list of your medications and current insurance cards. 7. You MUST have a responsible person to drive you home. 8. Someone MUST be with you the first 24 hours after you arrive home or your discharge will be delayed. 9. Please wear clothes that are easy to get on and off and wear slip-on shoes.  Thank you for allowing [place] to care for you!   --  Invasive Cardiovascular services

## 2018-11-06 ENCOUNTER — Encounter: Payer: Self-pay | Admitting: Cardiovascular Disease

## 2018-11-06 DIAGNOSIS — I25118 Atherosclerotic heart disease of native coronary artery with other forms of angina pectoris: Principal | ICD-10-CM

## 2018-11-06 DIAGNOSIS — E1165 Type 2 diabetes mellitus with hyperglycemia: Secondary | ICD-10-CM | POA: Insufficient documentation

## 2018-11-06 DIAGNOSIS — I5032 Chronic diastolic (congestive) heart failure: Secondary | ICD-10-CM

## 2018-11-06 DIAGNOSIS — I5033 Acute on chronic diastolic (congestive) heart failure: Secondary | ICD-10-CM | POA: Insufficient documentation

## 2018-11-06 DIAGNOSIS — E669 Obesity, unspecified: Secondary | ICD-10-CM | POA: Insufficient documentation

## 2018-11-06 LAB — LIPID PANEL
CHOL/HDL RATIO: 6.9 ratio — AB (ref 0.0–5.0)
Cholesterol, Total: 228 mg/dL — ABNORMAL HIGH (ref 100–199)
HDL: 33 mg/dL — ABNORMAL LOW (ref >39)
Triglycerides: 587 mg/dL (ref 0–149)

## 2018-11-06 LAB — BASIC METABOLIC PANEL
BUN/Creatinine Ratio: 18 (ref 9–20)
BUN: 21 mg/dL (ref 6–24)
CHLORIDE: 97 mmol/L (ref 96–106)
CO2: 21 mmol/L (ref 20–29)
Calcium: 9.9 mg/dL (ref 8.7–10.2)
Creatinine, Ser: 1.17 mg/dL (ref 0.76–1.27)
GFR calc non Af Amer: 69 mL/min/{1.73_m2} (ref >59)
GFR, EST AFRICAN AMERICAN: 80 mL/min/{1.73_m2} (ref >59)
Glucose: 380 mg/dL — ABNORMAL HIGH (ref 65–99)
Potassium: 4.5 mmol/L (ref 3.5–5.2)
Sodium: 134 mmol/L (ref 134–144)

## 2018-11-06 LAB — CBC WITH DIFFERENTIAL/PLATELET
Basophils Absolute: 0 10*3/uL (ref 0.0–0.2)
Basos: 0 %
EOS (ABSOLUTE): 0.2 10*3/uL (ref 0.0–0.4)
Eos: 3 %
HEMOGLOBIN: 17.1 g/dL (ref 13.0–17.7)
Hematocrit: 49.7 % (ref 37.5–51.0)
Immature Grans (Abs): 0 10*3/uL (ref 0.0–0.1)
Immature Granulocytes: 0 %
Lymphocytes Absolute: 2.9 10*3/uL (ref 0.7–3.1)
Lymphs: 35 %
MCH: 30.3 pg (ref 26.6–33.0)
MCHC: 34.4 g/dL (ref 31.5–35.7)
MCV: 88 fL (ref 79–97)
Monocytes Absolute: 0.5 10*3/uL (ref 0.1–0.9)
Monocytes: 6 %
Neutrophils Absolute: 4.6 10*3/uL (ref 1.4–7.0)
Neutrophils: 56 %
Platelets: 194 10*3/uL (ref 150–450)
RBC: 5.65 x10E6/uL (ref 4.14–5.80)
RDW: 12.6 % (ref 11.6–15.4)
WBC: 8.3 10*3/uL (ref 3.4–10.8)

## 2018-11-08 ENCOUNTER — Telehealth: Payer: Self-pay | Admitting: *Deleted

## 2018-11-08 NOTE — Telephone Encounter (Signed)
Pt contacted pre-catheterization scheduled at [facility] for:  Verified arrival time and place: [facility] Main Entrance A at:  No solid food after midnight prior to cath, clear liquids until 5 AM day of procedure. Contrast allergy: no  Hold: Metformin-day of procedure and 48 hours post procedure. Glipizide-AM of procedure. Insulin-AM of procedure. 1/2 Insulin Pm prior to procedure.  Except hold medications AM meds can be  taken pre-cath with sip of water including: ASA 81 mg  Confirmed patient has responsible person to drive home post procedure and observe 24 hours after arriving home: yes

## 2018-11-09 ENCOUNTER — Inpatient Hospital Stay (HOSPITAL_COMMUNITY): Payer: Medicare Other

## 2018-11-09 ENCOUNTER — Other Ambulatory Visit: Payer: Self-pay | Admitting: *Deleted

## 2018-11-09 ENCOUNTER — Other Ambulatory Visit: Payer: Self-pay

## 2018-11-09 ENCOUNTER — Inpatient Hospital Stay (HOSPITAL_COMMUNITY)
Admission: RE | Disposition: A | Discharge status: Home Health | Payer: Self-pay | Source: Home / Self Care | Attending: Cardiothoracic Surgery

## 2018-11-09 ENCOUNTER — Inpatient Hospital Stay (HOSPITAL_COMMUNITY)
Admission: RE | Admit: 2018-11-09 | Discharge: 2018-11-19 | DRG: 234 | Disposition: A | Discharge status: Home Health | Payer: Medicare Other | Attending: Cardiothoracic Surgery | Admitting: Cardiothoracic Surgery

## 2018-11-09 ENCOUNTER — Encounter (HOSPITAL_COMMUNITY): Payer: Self-pay | Admitting: General Practice

## 2018-11-09 DIAGNOSIS — I25119 Atherosclerotic heart disease of native coronary artery with unspecified angina pectoris: Secondary | ICD-10-CM | POA: Diagnosis not present

## 2018-11-09 DIAGNOSIS — F17220 Nicotine dependence, chewing tobacco, uncomplicated: Secondary | ICD-10-CM | POA: Diagnosis not present

## 2018-11-09 DIAGNOSIS — K219 Gastro-esophageal reflux disease without esophagitis: Secondary | ICD-10-CM | POA: Diagnosis present

## 2018-11-09 DIAGNOSIS — I4891 Unspecified atrial fibrillation: Secondary | ICD-10-CM | POA: Diagnosis not present

## 2018-11-09 DIAGNOSIS — E861 Hypovolemia: Secondary | ICD-10-CM | POA: Diagnosis not present

## 2018-11-09 DIAGNOSIS — G4710 Hypersomnia, unspecified: Secondary | ICD-10-CM | POA: Diagnosis not present

## 2018-11-09 DIAGNOSIS — G4733 Obstructive sleep apnea (adult) (pediatric): Secondary | ICD-10-CM | POA: Diagnosis not present

## 2018-11-09 DIAGNOSIS — Z8673 Personal history of transient ischemic attack (TIA), and cerebral infarction without residual deficits: Secondary | ICD-10-CM

## 2018-11-09 DIAGNOSIS — Z9689 Presence of other specified functional implants: Secondary | ICD-10-CM

## 2018-11-09 DIAGNOSIS — R931 Abnormal findings on diagnostic imaging of heart and coronary circulation: Secondary | ICD-10-CM

## 2018-11-09 DIAGNOSIS — E669 Obesity, unspecified: Secondary | ICD-10-CM | POA: Diagnosis present

## 2018-11-09 DIAGNOSIS — R079 Chest pain, unspecified: Secondary | ICD-10-CM

## 2018-11-09 DIAGNOSIS — Z01810 Encounter for preprocedural cardiovascular examination: Secondary | ICD-10-CM | POA: Diagnosis not present

## 2018-11-09 DIAGNOSIS — Z794 Long term (current) use of insulin: Secondary | ICD-10-CM

## 2018-11-09 DIAGNOSIS — I209 Angina pectoris, unspecified: Secondary | ICD-10-CM

## 2018-11-09 DIAGNOSIS — I5032 Chronic diastolic (congestive) heart failure: Secondary | ICD-10-CM | POA: Diagnosis not present

## 2018-11-09 DIAGNOSIS — E1165 Type 2 diabetes mellitus with hyperglycemia: Secondary | ICD-10-CM | POA: Diagnosis not present

## 2018-11-09 DIAGNOSIS — Z8349 Family history of other endocrine, nutritional and metabolic diseases: Secondary | ICD-10-CM

## 2018-11-09 DIAGNOSIS — E118 Type 2 diabetes mellitus with unspecified complications: Secondary | ICD-10-CM | POA: Diagnosis not present

## 2018-11-09 DIAGNOSIS — Z951 Presence of aortocoronary bypass graft: Secondary | ICD-10-CM

## 2018-11-09 DIAGNOSIS — Z8249 Family history of ischemic heart disease and other diseases of the circulatory system: Secondary | ICD-10-CM

## 2018-11-09 DIAGNOSIS — Z09 Encounter for follow-up examination after completed treatment for conditions other than malignant neoplasm: Secondary | ICD-10-CM

## 2018-11-09 DIAGNOSIS — I2510 Atherosclerotic heart disease of native coronary artery without angina pectoris: Secondary | ICD-10-CM | POA: Diagnosis not present

## 2018-11-09 DIAGNOSIS — K5900 Constipation, unspecified: Secondary | ICD-10-CM | POA: Diagnosis not present

## 2018-11-09 DIAGNOSIS — E1142 Type 2 diabetes mellitus with diabetic polyneuropathy: Secondary | ICD-10-CM | POA: Diagnosis not present

## 2018-11-09 DIAGNOSIS — I480 Paroxysmal atrial fibrillation: Secondary | ICD-10-CM | POA: Diagnosis not present

## 2018-11-09 DIAGNOSIS — E782 Mixed hyperlipidemia: Secondary | ICD-10-CM | POA: Diagnosis present

## 2018-11-09 DIAGNOSIS — I110 Hypertensive heart disease with heart failure: Secondary | ICD-10-CM | POA: Diagnosis not present

## 2018-11-09 DIAGNOSIS — Z01811 Encounter for preprocedural respiratory examination: Secondary | ICD-10-CM

## 2018-11-09 DIAGNOSIS — Z6832 Body mass index (BMI) 32.0-32.9, adult: Secondary | ICD-10-CM

## 2018-11-09 DIAGNOSIS — I25810 Atherosclerosis of coronary artery bypass graft(s) without angina pectoris: Secondary | ICD-10-CM | POA: Diagnosis present

## 2018-11-09 DIAGNOSIS — I9789 Other postprocedural complications and disorders of the circulatory system, not elsewhere classified: Secondary | ICD-10-CM | POA: Diagnosis not present

## 2018-11-09 DIAGNOSIS — D62 Acute posthemorrhagic anemia: Secondary | ICD-10-CM | POA: Diagnosis not present

## 2018-11-09 DIAGNOSIS — I25110 Atherosclerotic heart disease of native coronary artery with unstable angina pectoris: Secondary | ICD-10-CM | POA: Diagnosis not present

## 2018-11-09 DIAGNOSIS — J9811 Atelectasis: Secondary | ICD-10-CM

## 2018-11-09 DIAGNOSIS — J90 Pleural effusion, not elsewhere classified: Secondary | ICD-10-CM

## 2018-11-09 HISTORY — PX: LEFT HEART CATH AND CORONARY ANGIOGRAPHY: CATH118249

## 2018-11-09 HISTORY — DX: Atherosclerotic heart disease of native coronary artery without angina pectoris: I25.10

## 2018-11-09 LAB — BLOOD GAS, ARTERIAL
Acid-Base Excess: 2.1 mmol/L — ABNORMAL HIGH (ref 0.0–2.0)
Bicarbonate: 26.8 mmol/L (ref 20.0–28.0)
Drawn by: 249101
FIO2: 21
O2 Saturation: 91.7 %
Patient temperature: 98.6
pCO2 arterial: 46.7 mmHg (ref 32.0–48.0)
pH, Arterial: 7.377 (ref 7.350–7.450)
pO2, Arterial: 63.2 mmHg — ABNORMAL LOW (ref 83.0–108.0)

## 2018-11-09 LAB — COMPREHENSIVE METABOLIC PANEL
ALT: 44 U/L (ref 0–44)
AST: 40 U/L (ref 15–41)
Albumin: 2.9 g/dL — ABNORMAL LOW (ref 3.5–5.0)
Alkaline Phosphatase: 57 U/L (ref 38–126)
Anion gap: 7 (ref 5–15)
BUN: 16 mg/dL (ref 6–20)
CO2: 24 mmol/L (ref 22–32)
Calcium: 8.2 mg/dL — ABNORMAL LOW (ref 8.9–10.3)
Chloride: 103 mmol/L (ref 98–111)
Creatinine, Ser: 1.01 mg/dL (ref 0.61–1.24)
GFR calc Af Amer: >60 mL/min (ref >60)
GFR calc non Af Amer: >60 mL/min (ref >60)
Glucose, Bld: 288 mg/dL — ABNORMAL HIGH (ref 70–99)
Potassium: 3.8 mmol/L (ref 3.5–5.1)
Sodium: 134 mmol/L — ABNORMAL LOW (ref 135–145)
Total Bilirubin: 0.9 mg/dL (ref 0.3–1.2)
Total Protein: 6 g/dL — ABNORMAL LOW (ref 6.5–8.1)

## 2018-11-09 LAB — CBC
HCT: 41.7 % (ref 39.0–52.0)
Hemoglobin: 14.1 g/dL (ref 13.0–17.0)
MCH: 29.1 pg (ref 26.0–34.0)
MCHC: 33.8 g/dL (ref 30.0–36.0)
MCV: 86 fL (ref 80.0–100.0)
Platelets: 125 10*3/uL — ABNORMAL LOW (ref 150–400)
RBC: 4.85 MIL/uL (ref 4.22–5.81)
RDW: 12.3 % (ref 11.5–15.5)
WBC: 5.3 10*3/uL (ref 4.0–10.5)
nRBC: 0 % (ref 0.0–0.2)

## 2018-11-09 LAB — PULMONARY FUNCTION TEST
FEF 25-75 Pre: 1.89 L/sec
FEF2575-%Pred-Pre: 67 %
FEV1-%Pred-Pre: 55 %
FEV1-Pre: 1.83 L
FEV1FVC-%Pred-Pre: 104 %
FEV6-%Pred-Pre: 56 %
FEV6-Pre: 2.3 L
FEV6FVC-%Pred-Pre: 104 %
FVC-%Pred-Pre: 53 %
FVC-Pre: 2.3 L
Pre FEV1/FVC ratio: 80 %
Pre FEV6/FVC Ratio: 100 %

## 2018-11-09 LAB — GLUCOSE, CAPILLARY
Glucose-Capillary: 314 mg/dL — ABNORMAL HIGH (ref 70–99)
Glucose-Capillary: 276 mg/dL — ABNORMAL HIGH (ref 70–99)
Glucose-Capillary: 355 mg/dL — ABNORMAL HIGH (ref 70–99)
Glucose-Capillary: 292 mg/dL — ABNORMAL HIGH (ref 70–99)

## 2018-11-09 LAB — CREATININE, SERUM: CREATININE: 0.99 mg/dL (ref 0.61–1.24)

## 2018-11-09 LAB — PROTIME-INR
INR: 1 (ref 0.8–1.2)
Prothrombin Time: 13 seconds (ref 11.4–15.2)

## 2018-11-09 SURGERY — LEFT HEART CATH AND CORONARY ANGIOGRAPHY
Anesthesia: LOCAL

## 2018-11-09 MED ORDER — MIDAZOLAM HCL 2 MG/2ML IJ SOLN
INTRAMUSCULAR | Status: AC
  Filled 2018-11-09: qty 2

## 2018-11-09 MED ORDER — VERAPAMIL HCL 2.5 MG/ML IV SOLN
INTRAVENOUS | Status: AC
  Filled 2018-11-09: qty 2

## 2018-11-09 MED ORDER — INSULIN REGULAR(HUMAN) IN NACL 100-0.9 UT/100ML-% IV SOLN
INTRAVENOUS | Status: AC
  Administered 2018-11-10: 4.4 [IU]/h via INTRAVENOUS
  Filled 2018-11-09: qty 100

## 2018-11-09 MED ORDER — ASPIRIN 81 MG PO CHEW: 81.0000 mg | CHEWABLE_TABLET | ORAL | Status: DC

## 2018-11-09 MED ORDER — METOPROLOL TARTRATE 12.5 MG HALF TABLET
12.5000 mg | ORAL_TABLET | Freq: Once | ORAL | Status: AC
  Administered 2018-11-10: 12.5 mg via ORAL
  Filled 2018-11-09: qty 1

## 2018-11-09 MED ORDER — SODIUM CHLORIDE 0.9% FLUSH: 3.0000 mL | Freq: Two times a day (BID) | INTRAVENOUS | Status: DC

## 2018-11-09 MED ORDER — INSULIN ASPART 100 UNIT/ML ~~LOC~~ SOLN
SUBCUTANEOUS | Status: AC
  Filled 2018-11-09: qty 1

## 2018-11-09 MED ORDER — SODIUM CHLORIDE 0.9 % WEIGHT BASED INFUSION: 1.0000 mL/kg/h | INTRAVENOUS | Status: DC

## 2018-11-09 MED ORDER — DEXMEDETOMIDINE HCL IN NACL 400 MCG/100ML IV SOLN
0.1000 ug/kg/h | INTRAVENOUS | Status: AC
  Administered 2018-11-10: .2 ug/kg/h via INTRAVENOUS
  Filled 2018-11-09: qty 100

## 2018-11-09 MED ORDER — GABAPENTIN 400 MG PO CAPS
800.0000 mg | ORAL_CAPSULE | Freq: Three times a day (TID) | ORAL | Status: DC
  Administered 2018-11-09 – 2018-11-19 (×28): 800 mg via ORAL
  Filled 2018-11-09 (×29): qty 2

## 2018-11-09 MED ORDER — FENTANYL CITRATE (PF) 100 MCG/2ML IJ SOLN
INTRAMUSCULAR | Status: AC
  Filled 2018-11-09: qty 2

## 2018-11-09 MED ORDER — SODIUM CHLORIDE 0.9 % IV SOLN
INTRAVENOUS | Status: AC
  Administered 2018-11-09: 17:00:00 via INTRAVENOUS

## 2018-11-09 MED ORDER — POTASSIUM CHLORIDE 2 MEQ/ML IV SOLN
80.0000 meq | INTRAVENOUS | Status: DC
  Filled 2018-11-09: qty 40

## 2018-11-09 MED ORDER — MIDAZOLAM HCL 2 MG/2ML IJ SOLN
INTRAMUSCULAR | Status: DC | PRN
  Administered 2018-11-09: 2 mg via INTRAVENOUS

## 2018-11-09 MED ORDER — TRANEXAMIC ACID (OHS) BOLUS VIA INFUSION
15.0000 mg/kg | INTRAVENOUS | Status: AC
  Administered 2018-11-10: 1335 mg via INTRAVENOUS
  Filled 2018-11-09: qty 1335

## 2018-11-09 MED ORDER — ALLOPURINOL 300 MG PO TABS
300.0000 mg | ORAL_TABLET | Freq: Every day | ORAL | Status: DC
  Administered 2018-11-11 – 2018-11-19 (×9): 300 mg via ORAL
  Filled 2018-11-09 (×9): qty 1

## 2018-11-09 MED ORDER — SODIUM CHLORIDE 0.9% FLUSH: 3.0000 mL | INTRAVENOUS | Status: DC | PRN

## 2018-11-09 MED ORDER — VANCOMYCIN HCL 10 G IV SOLR
1500.0000 mg | INTRAVENOUS | Status: AC
  Administered 2018-11-10: 1500 mg via INTRAVENOUS
  Filled 2018-11-09 (×2): qty 1500

## 2018-11-09 MED ORDER — EPINEPHRINE PF 1 MG/ML IJ SOLN
0.0000 ug/min | INTRAVENOUS | Status: DC
  Filled 2018-11-09: qty 4

## 2018-11-09 MED ORDER — ASPIRIN EC 81 MG PO TBEC: 81.0000 mg | DELAYED_RELEASE_TABLET | Freq: Every day | ORAL | Status: DC

## 2018-11-09 MED ORDER — SODIUM CHLORIDE 0.9 % IV SOLN: 250.0000 mL | INTRAVENOUS | Status: DC | PRN

## 2018-11-09 MED ORDER — PANTOPRAZOLE SODIUM 40 MG PO TBEC
40.0000 mg | DELAYED_RELEASE_TABLET | Freq: Every day | ORAL | Status: DC
  Administered 2018-11-09: 40 mg via ORAL
  Filled 2018-11-09: qty 1

## 2018-11-09 MED ORDER — SODIUM CHLORIDE 0.9 % IV SOLN
1.5000 g | INTRAVENOUS | Status: AC
  Administered 2018-11-10: 750 g via INTRAVENOUS
  Administered 2018-11-10: 1.5 g via INTRAVENOUS
  Filled 2018-11-09 (×2): qty 1.5

## 2018-11-09 MED ORDER — ACETAMINOPHEN 325 MG PO TABS: 650.0000 mg | ORAL_TABLET | ORAL | Status: DC | PRN

## 2018-11-09 MED ORDER — SODIUM CHLORIDE 0.9 % IV SOLN
INTRAVENOUS | Status: DC
  Filled 2018-11-09: qty 30

## 2018-11-09 MED ORDER — INSULIN ASPART 100 UNIT/ML ~~LOC~~ SOLN: 0.0000 [IU] | Freq: Three times a day (TID) | SUBCUTANEOUS | Status: DC

## 2018-11-09 MED ORDER — HEPARIN (PORCINE) IN NACL 1000-0.9 UT/500ML-% IV SOLN
INTRAVENOUS | Status: DC | PRN
  Administered 2018-11-09 (×2): 500 mL

## 2018-11-09 MED ORDER — NITROGLYCERIN IN D5W 200-5 MCG/ML-% IV SOLN
2.0000 ug/min | INTRAVENOUS | Status: DC
  Filled 2018-11-09: qty 250

## 2018-11-09 MED ORDER — TEMAZEPAM 15 MG PO CAPS
15.0000 mg | ORAL_CAPSULE | Freq: Once | ORAL | Status: DC | PRN
  Filled 2018-11-09: qty 1

## 2018-11-09 MED ORDER — FENTANYL CITRATE (PF) 100 MCG/2ML IJ SOLN
INTRAMUSCULAR | Status: DC | PRN
  Administered 2018-11-09: 50 ug via INTRAVENOUS

## 2018-11-09 MED ORDER — SODIUM CHLORIDE 0.9% FLUSH
3.0000 mL | Freq: Two times a day (BID) | INTRAVENOUS | Status: DC
  Administered 2018-11-09: 3 mL via INTRAVENOUS

## 2018-11-09 MED ORDER — IOHEXOL 350 MG/ML SOLN
INTRAVENOUS | Status: DC | PRN
  Administered 2018-11-09: 60 mL

## 2018-11-09 MED ORDER — SODIUM CHLORIDE 0.9 % WEIGHT BASED INFUSION
3.0000 mL/kg/h | INTRAVENOUS | Status: DC
  Administered 2018-11-09: 3 mL/kg/h via INTRAVENOUS

## 2018-11-09 MED ORDER — DIAZEPAM 5 MG PO TABS
5.0000 mg | ORAL_TABLET | Freq: Four times a day (QID) | ORAL | Status: DC | PRN
  Filled 2018-11-09: qty 1

## 2018-11-09 MED ORDER — CHLORHEXIDINE GLUCONATE CLOTH 2 % EX PADS
6.0000 | MEDICATED_PAD | Freq: Once | CUTANEOUS | Status: AC
  Administered 2018-11-10: 6 via TOPICAL

## 2018-11-09 MED ORDER — PLASMA-LYTE 148 IV SOLN
INTRAVENOUS | Status: DC
  Filled 2018-11-09: qty 2.5

## 2018-11-09 MED ORDER — ATORVASTATIN CALCIUM 80 MG PO TABS
80.0000 mg | ORAL_TABLET | Freq: Every day | ORAL | Status: DC
  Administered 2018-11-09 – 2018-11-18 (×9): 80 mg via ORAL
  Filled 2018-11-09 (×10): qty 1

## 2018-11-09 MED ORDER — FLUTICASONE PROPIONATE 50 MCG/ACT NA SUSP
1.0000 | Freq: Every day | NASAL | Status: DC | PRN
  Filled 2018-11-09: qty 16

## 2018-11-09 MED ORDER — CYCLOBENZAPRINE HCL 10 MG PO TABS: 10.0000 mg | ORAL_TABLET | Freq: Two times a day (BID) | ORAL | Status: DC | PRN

## 2018-11-09 MED ORDER — INSULIN ASPART 100 UNIT/ML ~~LOC~~ SOLN
5.0000 [IU] | Freq: Once | SUBCUTANEOUS | Status: AC
  Administered 2018-11-09: 5 [IU] via SUBCUTANEOUS
  Filled 2018-11-09: qty 0.05

## 2018-11-09 MED ORDER — SAW PALMETTO (SERENOA REPENS) 160 MG PO CAPS: 160.0000 mg | ORAL_CAPSULE | Freq: Every day | ORAL | Status: DC

## 2018-11-09 MED ORDER — CHLORHEXIDINE GLUCONATE 0.12 % MT SOLN
15.0000 mL | Freq: Once | OROMUCOSAL | Status: AC
  Administered 2018-11-10: 15 mL via OROMUCOSAL
  Filled 2018-11-09: qty 15

## 2018-11-09 MED ORDER — ASPIRIN 81 MG PO CHEW: 81.0000 mg | CHEWABLE_TABLET | Freq: Every day | ORAL | Status: DC

## 2018-11-09 MED ORDER — NOREPINEPHRINE 4 MG/250ML-% IV SOLN
0.0000 ug/min | INTRAVENOUS | Status: DC
  Filled 2018-11-09: qty 250

## 2018-11-09 MED ORDER — ADULT MULTIVITAMIN W/MINERALS CH
1.0000 | ORAL_TABLET | ORAL | Status: DC
  Administered 2018-11-18: 1 via ORAL

## 2018-11-09 MED ORDER — MILRINONE LACTATE IN DEXTROSE 20-5 MG/100ML-% IV SOLN
0.3000 ug/kg/min | INTRAVENOUS | Status: AC
  Administered 2018-11-10: 0.3 ug/kg/min via INTRAVENOUS
  Filled 2018-11-09: qty 100

## 2018-11-09 MED ORDER — DIPHENHYDRAMINE HCL 25 MG PO TABS
25.0000 mg | ORAL_TABLET | Freq: Every day | ORAL | Status: DC | PRN
  Filled 2018-11-09: qty 1

## 2018-11-09 MED ORDER — VERAPAMIL HCL 2.5 MG/ML IV SOLN
INTRAVENOUS | Status: DC | PRN
  Administered 2018-11-09: 10 mL via INTRA_ARTERIAL

## 2018-11-09 MED ORDER — TRANEXAMIC ACID (OHS) PUMP PRIME SOLUTION
2.0000 mg/kg | INTRAVENOUS | Status: DC
  Filled 2018-11-09: qty 1.78

## 2018-11-09 MED ORDER — HEPARIN SODIUM (PORCINE) 5000 UNIT/ML IJ SOLN
5000.0000 [IU] | Freq: Three times a day (TID) | INTRAMUSCULAR | Status: DC
  Administered 2018-11-10: 5000 [IU] via SUBCUTANEOUS
  Filled 2018-11-09: qty 1

## 2018-11-09 MED ORDER — HYDROCODONE-ACETAMINOPHEN 5-325 MG PO TABS
1.0000 | ORAL_TABLET | Freq: Every day | ORAL | Status: DC | PRN
  Filled 2018-11-09: qty 1

## 2018-11-09 MED ORDER — GLIPIZIDE 5 MG PO TABS
5.0000 mg | ORAL_TABLET | Freq: Every day | ORAL | Status: DC
  Filled 2018-11-09: qty 1

## 2018-11-09 MED ORDER — DOPAMINE-DEXTROSE 3.2-5 MG/ML-% IV SOLN
0.0000 ug/kg/min | INTRAVENOUS | Status: AC
  Administered 2018-11-10: 3 ug/kg/min via INTRAVENOUS
  Filled 2018-11-09: qty 250

## 2018-11-09 MED ORDER — HEPARIN (PORCINE) IN NACL 1000-0.9 UT/500ML-% IV SOLN
INTRAVENOUS | Status: AC
  Filled 2018-11-09: qty 1000

## 2018-11-09 MED ORDER — PHENYLEPHRINE HCL-NACL 20-0.9 MG/250ML-% IV SOLN
30.0000 ug/min | INTRAVENOUS | Status: DC
  Filled 2018-11-09: qty 250

## 2018-11-09 MED ORDER — TRANEXAMIC ACID 1000 MG/10ML IV SOLN
1.5000 mg/kg/h | INTRAVENOUS | Status: AC
  Administered 2018-11-10: 1.5 mg/kg/h via INTRAVENOUS
  Filled 2018-11-09: qty 25

## 2018-11-09 MED ORDER — MAGNESIUM SULFATE 50 % IJ SOLN
40.0000 meq | INTRAMUSCULAR | Status: DC
  Filled 2018-11-09: qty 9.85

## 2018-11-09 MED ORDER — LIDOCAINE HCL (PF) 1 % IJ SOLN
INTRAMUSCULAR | Status: AC
  Filled 2018-11-09: qty 30

## 2018-11-09 MED ORDER — ALBUTEROL SULFATE (2.5 MG/3ML) 0.083% IN NEBU: 2.5000 mg | INHALATION_SOLUTION | Freq: Four times a day (QID) | RESPIRATORY_TRACT | Status: DC | PRN

## 2018-11-09 MED ORDER — HEPARIN SODIUM (PORCINE) 1000 UNIT/ML IJ SOLN
INTRAMUSCULAR | Status: DC | PRN
  Administered 2018-11-09: 4500 [IU] via INTRAVENOUS

## 2018-11-09 MED ORDER — BISACODYL 5 MG PO TBEC
5.0000 mg | DELAYED_RELEASE_TABLET | Freq: Once | ORAL | Status: AC
  Administered 2018-11-10: 5 mg via ORAL
  Filled 2018-11-09: qty 1

## 2018-11-09 MED ORDER — LIDOCAINE HCL (PF) 1 % IJ SOLN
INTRAMUSCULAR | Status: DC | PRN
  Administered 2018-11-09: 2 mL

## 2018-11-09 MED ORDER — DIPHENHYDRAMINE HCL 25 MG PO CAPS: 25.0000 mg | ORAL_CAPSULE | Freq: Every day | ORAL | Status: DC | PRN

## 2018-11-09 MED ORDER — SODIUM CHLORIDE 0.9 % IV SOLN
750.0000 mg | INTRAVENOUS | Status: DC
  Filled 2018-11-09 (×2): qty 750

## 2018-11-09 MED ORDER — ONDANSETRON HCL 4 MG/2ML IJ SOLN: 4.0000 mg | Freq: Four times a day (QID) | INTRAMUSCULAR | Status: DC | PRN

## 2018-11-09 SURGICAL SUPPLY — 12 items
CATH INFINITI 5FR ANG PIGTAIL (CATHETERS) ×2 IMPLANT
CATH OPTITORQUE TIG 4.0 5F (CATHETERS) ×2 IMPLANT
DEVICE RAD COMP TR BAND LRG (VASCULAR PRODUCTS) ×2 IMPLANT
GLIDESHEATH SLEND SS 6F .021 (SHEATH) ×2 IMPLANT
GUIDEWIRE INQWIRE 1.5J.035X260 (WIRE) ×1 IMPLANT
INQWIRE 1.5J .035X260CM (WIRE) ×2
KIT HEART LEFT (KITS) ×2 IMPLANT
PACK CARDIAC CATHETERIZATION (CUSTOM PROCEDURE TRAY) ×2 IMPLANT
SHEATH PROBE COVER 6X72 (BAG) ×2 IMPLANT
SYR MEDRAD MARK 7 150ML (SYRINGE) ×2 IMPLANT
TRANSDUCER W/STOPCOCK (MISCELLANEOUS) ×2 IMPLANT
TUBING CIL FLEX 10 FLL-RA (TUBING) ×2 IMPLANT

## 2018-11-09 NOTE — Progress Notes (Signed)
PHARMACIST - PHYSICIAN ORDER COMMUNICATION  CONCERNING: P&T Medication Policy on Herbal Medications  DESCRIPTION:  This patient's order for:  Saw palmetto  has been noted.  This product(s) is classified as an "herbal" or natural product. Due to a lack of definitive safety studies or FDA approval, nonstandard manufacturing practices, plus the potential risk of unknown drug-drug interactions while on inpatient medications, the Pharmacy and Therapeutics Committee does not permit the use of "herbal" or natural products of this type within [facility].   ACTION TAKEN: The pharmacy department is unable to verify this order at this time and your patient has been informed of this safety policy. Please reevaluate patient's clinical condition at discharge and address if the herbal or natural product(s) should be resumed at that time.  Sherron Monday, PharmD, BCCCP Clinical Pharmacist  Pager: [phone] Phone: [phone]
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
## 2018-11-09 NOTE — Progress Notes (Signed)
TCTS consulted for CABG evaluation. °

## 2018-11-09 NOTE — Progress Notes (Signed)
Notified Eric [name] of patient's CBG 314 and he has area that is very red  From him scratching left calf.

## 2018-11-09 NOTE — Progress Notes (Signed)
Admission RN states she will see pt. 

## 2018-11-09 NOTE — Anesthesia Preprocedure Evaluation (Addendum)
Anesthesia Evaluation  Patient identified by MRN, date of birth, ID band Patient awake    Reviewed: Allergy & Precautions, NPO status , Patient's Chart, lab work & pertinent test results  History of Anesthesia Complications Negative for: history of anesthetic complications  Airway Mallampati: II  TM Distance: >3 FB Neck ROM: Full    Dental  (+) Dental Advisory Given   Pulmonary asthma , sleep apnea , former smoker,    breath sounds clear to auscultation       Cardiovascular hypertension, Pt. on home beta blockers and Pt. on medications + angina + CAD and +CHF   Rhythm:Regular Rate:Normal   '20 Carotid US - 1-39% b/l ICAS  '20 Cath - Ost Ramus to Ramus lesion is 70% stenosed. Dist LM to Ost LAD lesion is 85% stenosed  '20 TTE - EF 60-65%. There is mild dilatation of the ascending aorta measuring 39 mm.    Neuro/Psych  Neuromuscular disease (diabetic neuropathy) CVA negative psych ROS   GI/Hepatic Neg liver ROS, hiatal hernia,   Endo/Other  diabetes, Poorly Controlled, Type 2, Oral Hypoglycemic Agents, Insulin Dependent Obesity   Renal/GU negative Renal ROS     Musculoskeletal  (+) Arthritis ,   Abdominal   Peds  Hematology  Thrombocytopenia    Anesthesia Other Findings   Reproductive/Obstetrics                            Anesthesia Physical Anesthesia Plan  ASA: IV  Anesthesia Plan: General   Post-op Pain Management:    Induction: Intravenous  PONV Risk Score and Plan: 2 and Treatment may vary due to age or medical condition, Ondansetron, Dexamethasone and Midazolam  Airway Management Planned: Oral ETT  Additional Equipment: Arterial line, CVP, PA Cath, TEE and Ultrasound Guidance Line Placement  Intra-op Plan:   Post-operative Plan: Post-operative intubation/ventilation  Informed Consent: I have reviewed the patients History and Physical, chart, labs and discussed  the procedure including the risks, benefits and alternatives for the proposed anesthesia with the patient or authorized representative who has indicated his/her understanding and acceptance.     Dental advisory given  Plan Discussed with: CRNA and Anesthesiologist  Anesthesia Plan Comments:        Anesthesia Quick Evaluation

## 2018-11-09 NOTE — Progress Notes (Signed)
Pt CBG= 355. RN paged MD for orders.

## 2018-11-09 NOTE — Progress Notes (Signed)
Pre CABG evaluation completed. Please see preliminary notes on CV PROC under chart review. Eric [name] H Macie Baum(RDMS RVT) 11/09/18 5:51 PM

## 2018-11-09 NOTE — Interval H&P Note (Signed)
Cath Lab Visit (complete for each Cath Lab visit)  Clinical Evaluation Leading to the Procedure:   ACS: No.  Non-ACS:    Anginal Classification: CCS III  Anti-ischemic medical therapy: Minimal Therapy (1 class of medications)  Non-Invasive Test Results: Intermediate-risk stress test findings: cardiac mortality 1-3%/year  Prior CABG: No previous CABG      History and Physical Interval Note:  11/09/2018 12:18 PM  Eric [name]  has presented today for surgery, with the diagnosis of Abnormal CT.  The various methods of treatment have been discussed with the patient and family. After consideration of risks, benefits and other options for treatment, the patient has consented to  Procedure(s): LEFT HEART CATH AND CORONARY ANGIOGRAPHY (N/A) as a surgical intervention.  The patient's history has been reviewed, patient examined, no change in status, stable for surgery.  I have reviewed the patient's chart and labs.  Questions were answered to the patient's satisfaction.     Nicki Guadalajara

## 2018-11-10 ENCOUNTER — Inpatient Hospital Stay (HOSPITAL_COMMUNITY): Payer: Medicare Other | Admitting: Anesthesiology

## 2018-11-10 ENCOUNTER — Encounter (HOSPITAL_COMMUNITY): Payer: Self-pay | Admitting: Cardiovascular Disease

## 2018-11-10 ENCOUNTER — Inpatient Hospital Stay (HOSPITAL_COMMUNITY): Payer: Medicare Other

## 2018-11-10 ENCOUNTER — Encounter (HOSPITAL_COMMUNITY)
Admission: RE | Disposition: A | Discharge status: Home Health | Payer: Self-pay | Source: Home / Self Care | Attending: Cardiothoracic Surgery

## 2018-11-10 DIAGNOSIS — I2510 Atherosclerotic heart disease of native coronary artery without angina pectoris: Secondary | ICD-10-CM | POA: Diagnosis present

## 2018-11-10 DIAGNOSIS — I25810 Atherosclerosis of coronary artery bypass graft(s) without angina pectoris: Secondary | ICD-10-CM | POA: Diagnosis present

## 2018-11-10 DIAGNOSIS — I25110 Atherosclerotic heart disease of native coronary artery with unstable angina pectoris: Secondary | ICD-10-CM

## 2018-11-10 HISTORY — PX: TEE WITHOUT CARDIOVERSION: SHX5443

## 2018-11-10 HISTORY — PX: CORONARY ARTERY BYPASS GRAFT: SHX141

## 2018-11-10 LAB — POCT I-STAT 7, (LYTES, BLD GAS, ICA,H+H)
Acid-base deficit: 4 mmol/L — ABNORMAL HIGH (ref 0.0–2.0)
Acid-base deficit: 3 mmol/L — ABNORMAL HIGH (ref 0.0–2.0)
Acid-base deficit: 3 mmol/L — ABNORMAL HIGH (ref 0.0–2.0)
Bicarbonate: 21.9 mmol/L (ref 20.0–28.0)
Bicarbonate: 23.3 mmol/L (ref 20.0–28.0)
Bicarbonate: 23.5 mmol/L (ref 20.0–28.0)
Calcium, Ion: 1.1 mmol/L — ABNORMAL LOW (ref 1.15–1.40)
Calcium, Ion: 1.15 mmol/L (ref 1.15–1.40)
Calcium, Ion: 1.17 mmol/L (ref 1.15–1.40)
HCT: 41 % (ref 39.0–52.0)
HCT: 40 % (ref 39.0–52.0)
HCT: 38 % — ABNORMAL LOW (ref 39.0–52.0)
Hemoglobin: 13.9 g/dL (ref 13.0–17.0)
Hemoglobin: 13.6 g/dL (ref 13.0–17.0)
Hemoglobin: 12.9 g/dL — ABNORMAL LOW (ref 13.0–17.0)
O2 Saturation: 94 %
O2 Saturation: 93 %
O2 Saturation: 92 %
Patient temperature: 36.3
Patient temperature: 36.8
Patient temperature: 38.1
Potassium: 3.3 mmol/L — ABNORMAL LOW (ref 3.5–5.1)
Potassium: 3.7 mmol/L (ref 3.5–5.1)
Potassium: 4.1 mmol/L (ref 3.5–5.1)
Sodium: 143 mmol/L (ref 135–145)
Sodium: 142 mmol/L (ref 135–145)
Sodium: 141 mmol/L (ref 135–145)
TCO2: 23 mmol/L (ref 22–32)
TCO2: 25 mmol/L (ref 22–32)
TCO2: 25 mmol/L (ref 22–32)
pCO2 arterial: 39.2 mmHg (ref 32.0–48.0)
pCO2 arterial: 47.4 mmHg (ref 32.0–48.0)
pCO2 arterial: 48.6 mmHg — ABNORMAL HIGH (ref 32.0–48.0)
pH, Arterial: 7.353 (ref 7.350–7.450)
pH, Arterial: 7.299 — ABNORMAL LOW (ref 7.350–7.450)
pH, Arterial: 7.298 — ABNORMAL LOW (ref 7.350–7.450)
pO2, Arterial: 71 mmHg — ABNORMAL LOW (ref 83.0–108.0)
pO2, Arterial: 73 mmHg — ABNORMAL LOW (ref 83.0–108.0)
pO2, Arterial: 75 mmHg — ABNORMAL LOW (ref 83.0–108.0)

## 2018-11-10 LAB — SURGICAL PCR SCREEN
MRSA, PCR: NEGATIVE
Staphylococcus aureus: POSITIVE — AB

## 2018-11-10 LAB — GLUCOSE, CAPILLARY
Glucose-Capillary: 268 mg/dL — ABNORMAL HIGH (ref 70–99)
Glucose-Capillary: 149 mg/dL — ABNORMAL HIGH (ref 70–99)
Glucose-Capillary: 143 mg/dL — ABNORMAL HIGH (ref 70–99)
Glucose-Capillary: 146 mg/dL — ABNORMAL HIGH (ref 70–99)
Glucose-Capillary: 131 mg/dL — ABNORMAL HIGH (ref 70–99)
Glucose-Capillary: 124 mg/dL — ABNORMAL HIGH (ref 70–99)
Glucose-Capillary: 109 mg/dL — ABNORMAL HIGH (ref 70–99)
Glucose-Capillary: 151 mg/dL — ABNORMAL HIGH (ref 70–99)
Glucose-Capillary: 114 mg/dL — ABNORMAL HIGH (ref 70–99)
Glucose-Capillary: 112 mg/dL — ABNORMAL HIGH (ref 70–99)

## 2018-11-10 LAB — URINALYSIS, ROUTINE W REFLEX MICROSCOPIC
Bacteria, UA: NONE SEEN
Bilirubin Urine: NEGATIVE
Glucose, UA: >=500 mg/dL — AB
Hgb urine dipstick: NEGATIVE
Ketones, ur: NEGATIVE mg/dL
Nitrite: NEGATIVE
Protein, ur: NEGATIVE mg/dL
Specific Gravity, Urine: 1.015 (ref 1.005–1.030)
pH: 5 (ref 5.0–8.0)

## 2018-11-10 LAB — CBC
HCT: 45.2 % (ref 39.0–52.0)
HCT: 44.5 % (ref 39.0–52.0)
HCT: 41.5 % (ref 39.0–52.0)
Hemoglobin: 14.9 g/dL (ref 13.0–17.0)
Hemoglobin: 15.6 g/dL (ref 13.0–17.0)
Hemoglobin: 14.2 g/dL (ref 13.0–17.0)
MCH: 28.7 pg (ref 26.0–34.0)
MCH: 29.9 pg (ref 26.0–34.0)
MCH: 29.3 pg (ref 26.0–34.0)
MCHC: 33 g/dL (ref 30.0–36.0)
MCHC: 35.1 g/dL (ref 30.0–36.0)
MCHC: 34.2 g/dL (ref 30.0–36.0)
MCV: 87.1 fL (ref 80.0–100.0)
MCV: 85.2 fL (ref 80.0–100.0)
MCV: 85.6 fL (ref 80.0–100.0)
PLATELETS: 143 10*3/uL — AB (ref 150–400)
Platelets: 142 10*3/uL — ABNORMAL LOW (ref 150–400)
Platelets: 154 10*3/uL (ref 150–400)
RBC: 5.19 MIL/uL (ref 4.22–5.81)
RBC: 5.22 MIL/uL (ref 4.22–5.81)
RBC: 4.85 MIL/uL (ref 4.22–5.81)
RDW: 12.2 % (ref 11.5–15.5)
RDW: 12.4 % (ref 11.5–15.5)
RDW: 12.3 % (ref 11.5–15.5)
WBC: 5.9 10*3/uL (ref 4.0–10.5)
WBC: 14 10*3/uL — ABNORMAL HIGH (ref 4.0–10.5)
WBC: 16.3 10*3/uL — ABNORMAL HIGH (ref 4.0–10.5)
nRBC: 0 % (ref 0.0–0.2)
nRBC: 0 % (ref 0.0–0.2)
nRBC: 0 % (ref 0.0–0.2)

## 2018-11-10 LAB — BASIC METABOLIC PANEL
Anion gap: 8 (ref 5–15)
Anion gap: 6 (ref 5–15)
BUN: 14 mg/dL (ref 6–20)
BUN: 10 mg/dL (ref 6–20)
CO2: 23 mmol/L (ref 22–32)
CO2: 23 mmol/L (ref 22–32)
Calcium: 8.5 mg/dL — ABNORMAL LOW (ref 8.9–10.3)
Calcium: 7.7 mg/dL — ABNORMAL LOW (ref 8.9–10.3)
Chloride: 105 mmol/L (ref 98–111)
Chloride: 111 mmol/L (ref 98–111)
Creatinine, Ser: 1.04 mg/dL (ref 0.61–1.24)
Creatinine, Ser: 0.96 mg/dL (ref 0.61–1.24)
GFR calc Af Amer: >60 mL/min (ref >60)
GFR calc non Af Amer: >60 mL/min (ref >60)
GFR calc non Af Amer: >60 mL/min (ref >60)
Glucose, Bld: 275 mg/dL — ABNORMAL HIGH (ref 70–99)
Glucose, Bld: 127 mg/dL — ABNORMAL HIGH (ref 70–99)
Potassium: 3.9 mmol/L (ref 3.5–5.1)
Potassium: 3.6 mmol/L (ref 3.5–5.1)
Sodium: 136 mmol/L (ref 135–145)
Sodium: 140 mmol/L (ref 135–145)

## 2018-11-10 LAB — TYPE AND SCREEN
ABO/RH(D): A NEG
Antibody Screen: NEGATIVE

## 2018-11-10 LAB — HEMOGLOBIN A1C
Hgb A1c MFr Bld: 11.8 % — ABNORMAL HIGH (ref 4.8–5.6)
Mean Plasma Glucose: 291.96 mg/dL

## 2018-11-10 LAB — PLATELET COUNT: Platelets: 150 10*3/uL (ref 150–400)

## 2018-11-10 LAB — POCT I-STAT 4, (NA,K, GLUC, HGB,HCT)
Glucose, Bld: 181 mg/dL — ABNORMAL HIGH (ref 70–99)
HCT: 43 % (ref 39.0–52.0)
Hemoglobin: 14.6 g/dL (ref 13.0–17.0)
Potassium: 3.6 mmol/L (ref 3.5–5.1)
Sodium: 142 mmol/L (ref 135–145)

## 2018-11-10 LAB — PROTIME-INR
INR: 1.2 (ref 0.8–1.2)
Prothrombin Time: 15.4 seconds — ABNORMAL HIGH (ref 11.4–15.2)

## 2018-11-10 LAB — HEMOGLOBIN AND HEMATOCRIT, BLOOD
HCT: 32.3 % — ABNORMAL LOW (ref 39.0–52.0)
Hemoglobin: 11.2 g/dL — ABNORMAL LOW (ref 13.0–17.0)

## 2018-11-10 LAB — APTT: aPTT: 28 seconds (ref 24–36)

## 2018-11-10 LAB — ABO/RH: ABO/RH(D): A NEG

## 2018-11-10 LAB — MAGNESIUM: Magnesium: 2.6 mg/dL — ABNORMAL HIGH (ref 1.7–2.4)

## 2018-11-10 SURGERY — CORONARY ARTERY BYPASS GRAFTING (CABG)
Anesthesia: General | Site: Chest

## 2018-11-10 MED ORDER — PLASMA-LYTE 148 IV SOLN
INTRAVENOUS | Status: DC | PRN
  Administered 2018-11-10: 500 mL via INTRAVASCULAR

## 2018-11-10 MED ORDER — DEXAMETHASONE SODIUM PHOSPHATE 10 MG/ML IJ SOLN
INTRAMUSCULAR | Status: AC
  Filled 2018-11-10: qty 1

## 2018-11-10 MED ORDER — FAMOTIDINE IN NACL 20-0.9 MG/50ML-% IV SOLN
20.0000 mg | Freq: Two times a day (BID) | INTRAVENOUS | Status: AC
  Administered 2018-11-10 – 2018-11-11 (×2): 20 mg via INTRAVENOUS
  Filled 2018-11-10: qty 50

## 2018-11-10 MED ORDER — FENTANYL CITRATE (PF) 250 MCG/5ML IJ SOLN
INTRAMUSCULAR | Status: DC | PRN
  Administered 2018-11-10: 100 ug via INTRAVENOUS
  Administered 2018-11-10: 50 ug via INTRAVENOUS
  Administered 2018-11-10 (×2): 100 ug via INTRAVENOUS
  Administered 2018-11-10 (×3): 50 ug via INTRAVENOUS
  Administered 2018-11-10: 100 ug via INTRAVENOUS
  Administered 2018-11-10: 50 ug via INTRAVENOUS
  Administered 2018-11-10 (×2): 100 ug via INTRAVENOUS

## 2018-11-10 MED ORDER — ORAL CARE MOUTH RINSE
15.0000 mL | OROMUCOSAL | Status: DC
  Administered 2018-11-10 – 2018-11-11 (×5): 15 mL via OROMUCOSAL

## 2018-11-10 MED ORDER — NITROGLYCERIN IN D5W 200-5 MCG/ML-% IV SOLN: 0.0000 ug/min | INTRAVENOUS | Status: DC

## 2018-11-10 MED ORDER — PROPOFOL 10 MG/ML IV BOLUS
INTRAVENOUS | Status: DC | PRN
  Administered 2018-11-10: 50 mg via INTRAVENOUS

## 2018-11-10 MED ORDER — SODIUM CHLORIDE 0.9 % IV SOLN
INTRAVENOUS | Status: DC | PRN
  Administered 2018-11-10: 10 ug/min via INTRAVENOUS

## 2018-11-10 MED ORDER — PHENYLEPHRINE 40 MCG/ML (10ML) SYRINGE FOR IV PUSH (FOR BLOOD PRESSURE SUPPORT)
PREFILLED_SYRINGE | INTRAVENOUS | Status: AC
  Filled 2018-11-10: qty 10

## 2018-11-10 MED ORDER — LIDOCAINE 2% (20 MG/ML) 5 ML SYRINGE
INTRAMUSCULAR | Status: AC
  Filled 2018-11-10: qty 5

## 2018-11-10 MED ORDER — CHLORHEXIDINE GLUCONATE CLOTH 2 % EX PADS
6.0000 | MEDICATED_PAD | Freq: Every day | CUTANEOUS | Status: DC
  Administered 2018-11-11 – 2018-11-14 (×4): 6 via TOPICAL

## 2018-11-10 MED ORDER — ROCURONIUM BROMIDE 50 MG/5ML IV SOSY
PREFILLED_SYRINGE | INTRAVENOUS | Status: AC
  Filled 2018-11-10: qty 5

## 2018-11-10 MED ORDER — ASPIRIN 81 MG PO CHEW: 324.0000 mg | CHEWABLE_TABLET | Freq: Every day | ORAL | Status: DC

## 2018-11-10 MED ORDER — ROCURONIUM BROMIDE 10 MG/ML (PF) SYRINGE
PREFILLED_SYRINGE | INTRAVENOUS | Status: DC | PRN
  Administered 2018-11-10: 30 mg via INTRAVENOUS
  Administered 2018-11-10: 20 mg via INTRAVENOUS
  Administered 2018-11-10: 100 mg via INTRAVENOUS
  Administered 2018-11-10: 50 mg via INTRAVENOUS

## 2018-11-10 MED ORDER — LACTATED RINGERS IV SOLN
INTRAVENOUS | Status: DC | PRN
  Administered 2018-11-10: 09:00:00 via INTRAVENOUS

## 2018-11-10 MED ORDER — PROPOFOL 10 MG/ML IV BOLUS
INTRAVENOUS | Status: AC
  Filled 2018-11-10: qty 20

## 2018-11-10 MED ORDER — CHLORHEXIDINE GLUCONATE CLOTH 2 % EX PADS
6.0000 | MEDICATED_PAD | Freq: Every day | CUTANEOUS | Status: DC
  Administered 2018-11-12: 6 via TOPICAL

## 2018-11-10 MED ORDER — LACTATED RINGERS IV SOLN: INTRAVENOUS | Status: DC

## 2018-11-10 MED ORDER — SODIUM CHLORIDE 0.9 % IV SOLN: 250.0000 mL | INTRAVENOUS | Status: DC

## 2018-11-10 MED ORDER — TRAMADOL HCL 50 MG PO TABS
50.0000 mg | ORAL_TABLET | ORAL | Status: DC | PRN
  Administered 2018-11-11 – 2018-11-12 (×3): 100 mg via ORAL
  Filled 2018-11-10 (×3): qty 2

## 2018-11-10 MED ORDER — POTASSIUM CHLORIDE 10 MEQ/50ML IV SOLN
10.0000 meq | INTRAVENOUS | Status: AC
  Administered 2018-11-10 (×3): 10 meq via INTRAVENOUS

## 2018-11-10 MED ORDER — CHLORHEXIDINE GLUCONATE 0.12% ORAL RINSE (MEDLINE KIT)
15.0000 mL | Freq: Two times a day (BID) | OROMUCOSAL | Status: DC
  Administered 2018-11-10: 15 mL via OROMUCOSAL

## 2018-11-10 MED ORDER — MAGNESIUM SULFATE 4 GM/100ML IV SOLN
4.0000 g | Freq: Once | INTRAVENOUS | Status: AC
  Administered 2018-11-10: 4 g via INTRAVENOUS
  Filled 2018-11-10: qty 100

## 2018-11-10 MED ORDER — MORPHINE SULFATE (PF) 2 MG/ML IV SOLN
1.0000 mg | INTRAVENOUS | Status: DC | PRN
  Administered 2018-11-10: 1 mg via INTRAVENOUS
  Administered 2018-11-10: 4 mg via INTRAVENOUS
  Administered 2018-11-11 – 2018-11-15 (×5): 2 mg via INTRAVENOUS
  Filled 2018-11-10: qty 1
  Filled 2018-11-10: qty 2
  Filled 2018-11-10 (×3): qty 1
  Filled 2018-11-10: qty 2
  Filled 2018-11-10: qty 1

## 2018-11-10 MED ORDER — MIDAZOLAM HCL 2 MG/2ML IJ SOLN: 2.0000 mg | INTRAMUSCULAR | Status: DC | PRN

## 2018-11-10 MED ORDER — LIDOCAINE HCL (CARDIAC) PF 100 MG/5ML IV SOSY
PREFILLED_SYRINGE | INTRAVENOUS | Status: DC | PRN
  Administered 2018-11-10: 60 mg via INTRAVENOUS

## 2018-11-10 MED ORDER — PROTAMINE SULFATE 10 MG/ML IV SOLN
INTRAVENOUS | Status: DC | PRN
  Administered 2018-11-10: 320 mg via INTRAVENOUS

## 2018-11-10 MED ORDER — SODIUM CHLORIDE 0.45 % IV SOLN
INTRAVENOUS | Status: DC | PRN
  Administered 2018-11-10: 14:00:00 via INTRAVENOUS

## 2018-11-10 MED ORDER — DEXMEDETOMIDINE HCL IN NACL 200 MCG/50ML IV SOLN
INTRAVENOUS | Status: AC
  Filled 2018-11-10: qty 50

## 2018-11-10 MED ORDER — INSULIN REGULAR(HUMAN) IN NACL 100-0.9 UT/100ML-% IV SOLN
INTRAVENOUS | Status: DC
  Administered 2018-11-10: 5.1 [IU]/h via INTRAVENOUS
  Filled 2018-11-10: qty 100

## 2018-11-10 MED ORDER — DEXMEDETOMIDINE HCL IN NACL 200 MCG/50ML IV SOLN
0.0000 ug/kg/h | INTRAVENOUS | Status: DC
  Administered 2018-11-10 – 2018-11-11 (×2): 0.5 ug/kg/h via INTRAVENOUS
  Filled 2018-11-10: qty 50

## 2018-11-10 MED ORDER — ALBUMIN HUMAN 5 % IV SOLN
250.0000 mL | INTRAVENOUS | Status: DC | PRN
  Administered 2018-11-10 (×2): 12.5 g via INTRAVENOUS
  Filled 2018-11-10: qty 250

## 2018-11-10 MED ORDER — ACETAMINOPHEN 160 MG/5ML PO SOLN
1000.0000 mg | Freq: Four times a day (QID) | ORAL | Status: DC
  Administered 2018-11-10: 1000 mg
  Filled 2018-11-10: qty 40.6

## 2018-11-10 MED ORDER — LACTATED RINGERS IV SOLN
INTRAVENOUS | Status: DC | PRN
  Administered 2018-11-10 (×2): via INTRAVENOUS

## 2018-11-10 MED ORDER — MILRINONE LACTATE IN DEXTROSE 20-5 MG/100ML-% IV SOLN
0.3000 ug/kg/min | INTRAVENOUS | Status: DC
  Administered 2018-11-10: 0.3 ug/kg/min via INTRAVENOUS
  Filled 2018-11-10: qty 100

## 2018-11-10 MED ORDER — METOPROLOL TARTRATE 12.5 MG HALF TABLET
12.5000 mg | ORAL_TABLET | Freq: Two times a day (BID) | ORAL | Status: DC
  Administered 2018-11-12: 12.5 mg via ORAL
  Filled 2018-11-10 (×2): qty 1

## 2018-11-10 MED ORDER — INSULIN REGULAR BOLUS VIA INFUSION
0.0000 [IU] | Freq: Three times a day (TID) | INTRAVENOUS | Status: DC
  Filled 2018-11-10: qty 10

## 2018-11-10 MED ORDER — HEPARIN SODIUM (PORCINE) 1000 UNIT/ML IJ SOLN
INTRAMUSCULAR | Status: DC | PRN
  Administered 2018-11-10: 32000 [IU] via INTRAVENOUS

## 2018-11-10 MED ORDER — 0.9 % SODIUM CHLORIDE (POUR BTL) OPTIME
TOPICAL | Status: DC | PRN
  Administered 2018-11-10: 6000 mL

## 2018-11-10 MED ORDER — DOPAMINE-DEXTROSE 3.2-5 MG/ML-% IV SOLN
2.5000 ug/kg/min | INTRAVENOUS | Status: DC
  Administered 2018-11-12: 3 ug/kg/min via INTRAVENOUS
  Filled 2018-11-10: qty 250

## 2018-11-10 MED ORDER — BISACODYL 5 MG PO TBEC
10.0000 mg | DELAYED_RELEASE_TABLET | Freq: Every day | ORAL | Status: DC
  Administered 2018-11-11 – 2018-11-15 (×5): 10 mg via ORAL
  Filled 2018-11-10 (×5): qty 2

## 2018-11-10 MED ORDER — SODIUM CHLORIDE 0.9 % IV SOLN
INTRAVENOUS | Status: DC
  Administered 2018-11-10 (×2): via INTRAVENOUS

## 2018-11-10 MED ORDER — PROTAMINE SULFATE 10 MG/ML IV SOLN
INTRAVENOUS | Status: AC
  Filled 2018-11-10: qty 5

## 2018-11-10 MED ORDER — PHENYLEPHRINE HCL-NACL 20-0.9 MG/250ML-% IV SOLN
0.0000 ug/min | INTRAVENOUS | Status: DC
  Administered 2018-11-10: 70 ug/min via INTRAVENOUS
  Administered 2018-11-11: 60 ug/min via INTRAVENOUS
  Filled 2018-11-10 (×3): qty 250

## 2018-11-10 MED ORDER — BISACODYL 10 MG RE SUPP: 10.0000 mg | Freq: Every day | RECTAL | Status: DC

## 2018-11-10 MED ORDER — SODIUM CHLORIDE 0.9% FLUSH: 3.0000 mL | INTRAVENOUS | Status: DC | PRN

## 2018-11-10 MED ORDER — ACETAMINOPHEN 160 MG/5ML PO SOLN: 650.0000 mg | Freq: Once | ORAL | Status: AC

## 2018-11-10 MED ORDER — CHLORHEXIDINE GLUCONATE 0.12 % MT SOLN
15.0000 mL | OROMUCOSAL | Status: AC
  Administered 2018-11-10: 15 mL via OROMUCOSAL

## 2018-11-10 MED ORDER — ASPIRIN EC 325 MG PO TBEC
325.0000 mg | DELAYED_RELEASE_TABLET | Freq: Every day | ORAL | Status: DC
  Administered 2018-11-11 – 2018-11-15 (×5): 325 mg via ORAL
  Filled 2018-11-10 (×5): qty 1

## 2018-11-10 MED ORDER — MUPIROCIN 2 % EX OINT
1.0000 "application " | TOPICAL_OINTMENT | Freq: Two times a day (BID) | CUTANEOUS | Status: DC
  Administered 2018-11-10 – 2018-11-14 (×9): 1 via NASAL
  Filled 2018-11-10 (×3): qty 22

## 2018-11-10 MED ORDER — SODIUM CHLORIDE 0.9 % IV SOLN
1.5000 g | Freq: Two times a day (BID) | INTRAVENOUS | Status: AC
  Administered 2018-11-10 – 2018-11-12 (×4): 1.5 g via INTRAVENOUS
  Filled 2018-11-10 (×4): qty 1.5

## 2018-11-10 MED ORDER — SODIUM CHLORIDE 0.9% FLUSH: 10.0000 mL | INTRAVENOUS | Status: DC | PRN

## 2018-11-10 MED ORDER — LACTATED RINGERS IV SOLN: 500.0000 mL | Freq: Once | INTRAVENOUS | Status: DC | PRN

## 2018-11-10 MED ORDER — ACETAMINOPHEN 650 MG RE SUPP
650.0000 mg | Freq: Once | RECTAL | Status: AC
  Administered 2018-11-10: 650 mg via RECTAL
  Filled 2018-11-10: qty 1

## 2018-11-10 MED ORDER — SODIUM CHLORIDE (PF) 0.9 % IJ SOLN
OROMUCOSAL | Status: DC | PRN
  Administered 2018-11-10 (×3): 4 mL via TOPICAL

## 2018-11-10 MED ORDER — MIDAZOLAM HCL 5 MG/5ML IJ SOLN
INTRAMUSCULAR | Status: DC | PRN
  Administered 2018-11-10: 3 mg via INTRAVENOUS
  Administered 2018-11-10: 1 mg via INTRAVENOUS
  Administered 2018-11-10 (×3): 2 mg via INTRAVENOUS

## 2018-11-10 MED ORDER — OXYCODONE HCL 5 MG PO TABS
5.0000 mg | ORAL_TABLET | ORAL | Status: DC | PRN
  Administered 2018-11-11 (×3): 10 mg via ORAL
  Administered 2018-11-12: 5 mg via ORAL
  Administered 2018-11-12: 10 mg via ORAL
  Administered 2018-11-12: 5 mg via ORAL
  Administered 2018-11-13 – 2018-11-15 (×8): 10 mg via ORAL
  Filled 2018-11-10 (×5): qty 2
  Filled 2018-11-10: qty 1
  Filled 2018-11-10 (×3): qty 2
  Filled 2018-11-10: qty 1
  Filled 2018-11-10 (×6): qty 2

## 2018-11-10 MED ORDER — VANCOMYCIN HCL IN DEXTROSE 1-5 GM/200ML-% IV SOLN
1000.0000 mg | Freq: Once | INTRAVENOUS | Status: AC
  Administered 2018-11-10: 1000 mg via INTRAVENOUS
  Filled 2018-11-10: qty 200

## 2018-11-10 MED ORDER — PANTOPRAZOLE SODIUM 40 MG PO TBEC
40.0000 mg | DELAYED_RELEASE_TABLET | Freq: Every day | ORAL | Status: DC
  Administered 2018-11-12 – 2018-11-15 (×4): 40 mg via ORAL
  Filled 2018-11-10 (×4): qty 1

## 2018-11-10 MED ORDER — HEMOSTATIC AGENTS (NO CHARGE) OPTIME
TOPICAL | Status: DC | PRN
  Administered 2018-11-10 (×2): 1 via TOPICAL

## 2018-11-10 MED ORDER — DOCUSATE SODIUM 100 MG PO CAPS
200.0000 mg | ORAL_CAPSULE | Freq: Every day | ORAL | Status: DC
  Administered 2018-11-11 – 2018-11-15 (×5): 200 mg via ORAL
  Filled 2018-11-10 (×5): qty 2

## 2018-11-10 MED ORDER — MIDAZOLAM HCL (PF) 10 MG/2ML IJ SOLN
INTRAMUSCULAR | Status: AC
  Filled 2018-11-10: qty 2

## 2018-11-10 MED ORDER — METOPROLOL TARTRATE 5 MG/5ML IV SOLN
2.5000 mg | INTRAVENOUS | Status: DC | PRN
  Administered 2018-11-12: 5 mg via INTRAVENOUS
  Filled 2018-11-10 (×2): qty 5

## 2018-11-10 MED ORDER — ACETAMINOPHEN 500 MG PO TABS
1000.0000 mg | ORAL_TABLET | Freq: Four times a day (QID) | ORAL | Status: DC
  Administered 2018-11-11 (×3): 1000 mg via ORAL
  Administered 2018-11-12 (×2): 500 mg via ORAL
  Filled 2018-11-10 (×6): qty 2

## 2018-11-10 MED ORDER — POTASSIUM CHLORIDE 10 MEQ/50ML IV SOLN
10.0000 meq | INTRAVENOUS | Status: AC
  Administered 2018-11-10 (×3): 10 meq via INTRAVENOUS
  Filled 2018-11-10 (×3): qty 50

## 2018-11-10 MED ORDER — SODIUM CHLORIDE 0.9% FLUSH
3.0000 mL | Freq: Two times a day (BID) | INTRAVENOUS | Status: DC
  Administered 2018-11-11 – 2018-11-15 (×5): 3 mL via INTRAVENOUS

## 2018-11-10 MED ORDER — SODIUM CHLORIDE 0.9% FLUSH
10.0000 mL | Freq: Two times a day (BID) | INTRAVENOUS | Status: DC
  Administered 2018-11-10 – 2018-11-15 (×7): 10 mL

## 2018-11-10 MED ORDER — ROCURONIUM BROMIDE 50 MG/5ML IV SOSY
PREFILLED_SYRINGE | INTRAVENOUS | Status: AC
  Filled 2018-11-10: qty 10

## 2018-11-10 MED ORDER — FENTANYL CITRATE (PF) 250 MCG/5ML IJ SOLN
INTRAMUSCULAR | Status: AC
  Filled 2018-11-10: qty 25

## 2018-11-10 MED ORDER — ONDANSETRON HCL 4 MG/2ML IJ SOLN
INTRAMUSCULAR | Status: AC
  Filled 2018-11-10: qty 2

## 2018-11-10 MED ORDER — ONDANSETRON HCL 4 MG/2ML IJ SOLN
4.0000 mg | Freq: Four times a day (QID) | INTRAMUSCULAR | Status: DC | PRN
  Administered 2018-11-11 – 2018-11-15 (×4): 4 mg via INTRAVENOUS
  Filled 2018-11-10 (×4): qty 2

## 2018-11-10 MED ORDER — METOPROLOL TARTRATE 25 MG/10 ML ORAL SUSPENSION: 12.5000 mg | Freq: Two times a day (BID) | ORAL | Status: DC

## 2018-11-10 SURGICAL SUPPLY — 73 items
BAG DECANTER FOR FLEXI CONT (MISCELLANEOUS) ×3 IMPLANT
BANDAGE ACE 4X5 VEL STRL LF (GAUZE/BANDAGES/DRESSINGS) ×3 IMPLANT
BANDAGE ACE 6X5 VEL STRL LF (GAUZE/BANDAGES/DRESSINGS) ×3 IMPLANT
BANDAGE ELASTIC 4 VELCRO ST LF (GAUZE/BANDAGES/DRESSINGS) ×1 IMPLANT
BANDAGE ELASTIC 6 VELCRO ST LF (GAUZE/BANDAGES/DRESSINGS) ×1 IMPLANT
BLADE STERNUM SYSTEM 6 (BLADE) ×3 IMPLANT
BLADE SURG 11 STRL SS (BLADE) ×1 IMPLANT
BNDG GAUZE ELAST 4 BULKY (GAUZE/BANDAGES/DRESSINGS) ×3 IMPLANT
CANISTER SUCT 3000ML PPV (MISCELLANEOUS) ×3 IMPLANT
CATH CPB KIT GERHARDT (MISCELLANEOUS) ×3 IMPLANT
CATH THORACIC 28FR (CATHETERS) ×3 IMPLANT
COVER WAND RF STERILE (DRAPES) ×3 IMPLANT
CRADLE DONUT ADULT HEAD (MISCELLANEOUS) ×3 IMPLANT
DERMABOND ADVANCED (GAUZE/BANDAGES/DRESSINGS) ×1
DERMABOND ADVANCED .7 DNX12 (GAUZE/BANDAGES/DRESSINGS) IMPLANT
DRAIN CHANNEL 28F RND 3/8 FF (WOUND CARE) ×3 IMPLANT
DRAPE CARDIOVASCULAR INCISE (DRAPES) ×1
DRAPE SLUSH/WARMER DISC (DRAPES) ×3 IMPLANT
DRAPE SRG 135X102X78XABS (DRAPES) ×2 IMPLANT
DRSG AQUACEL AG ADV 3.5X14 (GAUZE/BANDAGES/DRESSINGS) ×3 IMPLANT
ELECT BLADE 4.0 EZ CLEAN MEGAD (MISCELLANEOUS) ×3
ELECT REM PT RETURN 9FT ADLT (ELECTROSURGICAL) ×6
ELECTRODE BLDE 4.0 EZ CLN MEGD (MISCELLANEOUS) ×2 IMPLANT
ELECTRODE REM PT RTRN 9FT ADLT (ELECTROSURGICAL) ×4 IMPLANT
FELT TEFLON 1X6 (MISCELLANEOUS) ×6 IMPLANT
GAUZE SPONGE 4X4 12PLY STRL (GAUZE/BANDAGES/DRESSINGS) ×6 IMPLANT
GLOVE BIO SURGEON STRL SZ 6.5 (GLOVE) ×14 IMPLANT
GLOVE BIO SURGEON STRL SZ8 (GLOVE) ×1 IMPLANT
GLOVE BIOGEL PI IND STRL 6 (GLOVE) IMPLANT
GLOVE BIOGEL PI IND STRL 6.5 (GLOVE) IMPLANT
GLOVE BIOGEL PI INDICATOR 6 (GLOVE) ×1
GLOVE BIOGEL PI INDICATOR 6.5 (GLOVE) ×1
GOWN STRL REUS W/ TWL LRG LVL3 (GOWN DISPOSABLE) ×8 IMPLANT
GOWN STRL REUS W/TWL LRG LVL3 (GOWN DISPOSABLE) ×8
HEMOSTAT POWDER SURGIFOAM 1G (HEMOSTASIS) ×9 IMPLANT
HEMOSTAT SURGICEL 2X14 (HEMOSTASIS) ×3 IMPLANT
KIT BASIN OR (CUSTOM PROCEDURE TRAY) ×3 IMPLANT
KIT CATH SUCT 8FR (CATHETERS) ×3 IMPLANT
KIT SUCTION CATH 14FR (SUCTIONS) ×6 IMPLANT
KIT TURNOVER KIT B (KITS) ×3 IMPLANT
KIT VASOVIEW HEMOPRO 2 VH 4000 (KITS) ×3 IMPLANT
LEAD PACING MYOCARDI (MISCELLANEOUS) ×3 IMPLANT
MARKER GRAFT CORONARY BYPASS (MISCELLANEOUS) ×9 IMPLANT
NS IRRIG 1000ML POUR BTL (IV SOLUTION) ×15 IMPLANT
PACK E OPEN HEART (SUTURE) ×3 IMPLANT
PACK OPEN HEART (CUSTOM PROCEDURE TRAY) ×3 IMPLANT
PAD ARMBOARD 7.5X6 YLW CONV (MISCELLANEOUS) ×6 IMPLANT
PAD ELECT DEFIB RADIOL ZOLL (MISCELLANEOUS) ×3 IMPLANT
PENCIL BUTTON HOLSTER BLD 10FT (ELECTRODE) ×3 IMPLANT
PUNCH AORTIC ROTATE  4.5MM 8IN (MISCELLANEOUS) ×1 IMPLANT
SET CARDIOPLEGIA MPS 5001102 (MISCELLANEOUS) ×1 IMPLANT
SPONGE LAP 18X18 X RAY DECT (DISPOSABLE) ×3 IMPLANT
SUT BONE WAX W31G (SUTURE) ×3 IMPLANT
SUT MNCRL AB 4-0 PS2 18 (SUTURE) ×1 IMPLANT
SUT PROLENE 3 0 SH1 36 (SUTURE) ×5 IMPLANT
SUT PROLENE 4 0 TF (SUTURE) ×6 IMPLANT
SUT PROLENE 6 0 CC (SUTURE) ×6 IMPLANT
SUT PROLENE 7 0 BV1 MDA (SUTURE) ×3 IMPLANT
SUT PROLENE 8 0 BV175 6 (SUTURE) ×3 IMPLANT
SUT STEEL 6MS V (SUTURE) ×3 IMPLANT
SUT STEEL SZ 6 DBL 3X14 BALL (SUTURE) ×3 IMPLANT
SUT VIC AB 1 CTX 18 (SUTURE) ×6 IMPLANT
SUT VIC AB 2-0 CT1 27 (SUTURE) ×1
SUT VIC AB 2-0 CT1 TAPERPNT 27 (SUTURE) IMPLANT
SYSTEM SAHARA CHEST DRAIN ATS (WOUND CARE) ×3 IMPLANT
TAPE CLOTH SURG 4X10 WHT LF (GAUZE/BANDAGES/DRESSINGS) ×1 IMPLANT
TAPE PAPER 2X10 WHT MICROPORE (GAUZE/BANDAGES/DRESSINGS) ×1 IMPLANT
TOWEL GREEN STERILE (TOWEL DISPOSABLE) ×3 IMPLANT
TOWEL GREEN STERILE FF (TOWEL DISPOSABLE) ×3 IMPLANT
TRAY FOLEY SLVR 16FR TEMP STAT (SET/KITS/TRAYS/PACK) ×3 IMPLANT
TUBING LAP HI FLOW INSUFFLATIO (TUBING) ×3 IMPLANT
UNDERPAD 30X30 (UNDERPADS AND DIAPERS) ×3 IMPLANT
WATER STERILE IRR 1000ML POUR (IV SOLUTION) ×6 IMPLANT

## 2018-11-10 NOTE — Brief Op Note (Addendum)
      [address]       [name] [address]             [phone]    11/09/2018 - 11/10/2018  11:54 AM  PATIENT:  Eric [name]  58 y.o. male  PRE-OPERATIVE DIAGNOSIS:  CAD  POST-OPERATIVE DIAGNOSIS:  CAD  PROCEDURE:  TRANSESOPHAGEAL ECHOCARDIOGRAM (TEE), MEDIAN STERNOTOMY for CORONARY ARTERY BYPASS GRAFTING (CABG) x Two , (LIMA to LAD and SVG to Ramus Intermediate) using left internal mammary artery and right leg greater saphenous vein harvested endoscopically  SURGEON:  Surgeon(s) and Role:    Delight Ovens, MD - Primary  PHYSICIAN ASSISTANT: Doree Fudge PA-C  ASSISTANTS: Virgilio Frees RNFA  ANESTHESIA:   general  EBL:  Per anesthesia and perfusion record  DRAINS: Chest tubes placed in the mediastinal and pleural spaces   COUNTS CORRECT:  YES  DICTATION: .Dragon Dictation  PLAN OF CARE: Admit to inpatient   PATIENT DISPOSITION:  ICU - intubated and hemodynamically stable.   Delay start of Pharmacological VTE agent (>24hrs) due to surgical blood loss or risk of bleeding: yes  BASELINE WEIGHT: 89.1 kg

## 2018-11-10 NOTE — Progress Notes (Signed)
Rapid wean protocol attempt #2 initiated with RT at this time.  Herma Ard, RN

## 2018-11-10 NOTE — Progress Notes (Signed)
Rapid wean protocol terminated due to ABG results, decrease in sats when clamped down, struggling with sedation medium.  Per MD, placed patient back on original ventilator settings.

## 2018-11-10 NOTE — Transfer of Care (Signed)
Immediate Anesthesia Transfer of Care Note  Patient: Eric [name]  Procedure(s) Performed: CORONARY ARTERY BYPASS GRAFTING (CABG) x Two , using left internal mammary artery and right leg greater saphenous vein harvested endoscopically - SVG to Ramus, LIMA to LAD (N/A Chest) TRANSESOPHAGEAL ECHOCARDIOGRAM (TEE) (N/A )  Patient Location: SICU  Anesthesia Type:General  Level of Consciousness: sedated and Patient remains intubated per anesthesia plan  Airway & Oxygen Therapy: Patient remains intubated per anesthesia plan and Patient placed on Ventilator (see vital sign flow sheet for setting)  Post-op Assessment: Report given to RN and Post -op Vital signs reviewed and stable  Post vital signs: Reviewed and stable  Last Vitals:  Vitals Value Taken Time  BP    Temp 36.2 C 11/10/2018  1:58 PM  Pulse 90 11/10/2018  1:58 PM  Resp 10 11/10/2018  1:58 PM  SpO2 92 % 11/10/2018  1:58 PM  Vitals shown include unvalidated device data.  Last Pain:  Vitals:   11/10/18 0443  TempSrc: Oral  PainSc:       Patients Stated Pain Goal: 3 (11/09/18 1016)  Complications: No apparent anesthesia complications

## 2018-11-10 NOTE — Progress Notes (Signed)
Rapid wean attempt #2 aborted per ABG results:   Ref. Range 11/10/2018 22:06  Sample type Unknown ARTERIAL  pH, Arterial Latest Ref Range: 7.350 - 7.450  7.298 (L)  pCO2 arterial Latest Ref Range: 32.0 - 48.0 mmHg 48.6 (H)  pO2, Arterial Latest Ref Range: 83.0 - 108.0 mmHg 75.0 (L)  TCO2 Latest Ref Range: 22 - 32 mmol/L 25  Acid-base deficit Latest Ref Range: 0.0 - 2.0 mmol/L 3.0 (H)  Bicarbonate Latest Ref Range: 20.0 - 28.0 mmol/L 23.5  O2 Saturation Latest Units: % 92.0  Patient temperature Unknown 38.1 C  Collection site Unknown ARTERIAL LINE  Sodium Latest Ref Range: 135 - 145 mmol/L 141  Potassium Latest Ref Range: 3.5 - 5.1 mmol/L 4.1  Calcium Ionized Latest Ref Range: 1.15 - 1.40 mmol/L 1.17  Hemoglobin Latest Ref Range: 13.0 - 17.0 g/dL 58.3 (L)  HCT Latest Ref Range: 39.0 - 52.0 % 38.0 (L)   Will re-attempt around midnight.  Herma Ard, RN

## 2018-11-10 NOTE — Progress Notes (Signed)
  Echocardiogram Echocardiogram Transesophageal has been performed.  Eric [name] 11/10/2018, 9:26 AM

## 2018-11-10 NOTE — Progress Notes (Signed)
RT note: rapid wean protocol initiated at 1630.

## 2018-11-10 NOTE — Plan of Care (Signed)
  Problem: Activity: Goal: Risk for activity intolerance will decrease Outcome: Progressing   Problem: Pain Managment: Goal: General experience of comfort will improve Outcome: Progressing   Problem: Safety: Goal: Ability to remain free from injury will improve Outcome: Progressing   

## 2018-11-10 NOTE — Anesthesia Procedure Notes (Signed)
Arterial Line Insertion Start/End3/07/2019 8:12 AM, 11/10/2018 8:19 AM Performed by: CRNA  Patient location: Pre-op. Lidocaine 1% used for infiltration and patient sedated radial was placed Catheter size: 20 G Hand hygiene performed , maximum sterile barriers used  and Seldinger technique used Allen's test indicative of satisfactory collateral circulation Attempts: 2 Procedure performed without using ultrasound guided technique. Following insertion, dressing applied and Biopatch. Post procedure assessment: normal  Patient tolerated the procedure well with no immediate complications.

## 2018-11-10 NOTE — Research (Addendum)
[place] Informed Consent   Subject Name: Eric [name]  Subject met inclusion and exclusion criteria.  The informed consent form, study requirements and expectations were reviewed with the subject and questions and concerns were addressed prior to the signing of the consent form.  The subject verbalized understanding of the trail requirements.  The subject agreed to participate in the [facility] trial and signed the informed consent.  The informed consent was obtained prior to performance of any protocol-specific procedures for the subject.  A copy of the signed informed consent was given to the subject and a copy was placed in the subject's medical record.  Hedrick,Tammy W 11/10/2018, 0650 Yes No Inclusion  _0  _1  Institutional Review Board (IRB)/Independent Ethics Committee (IEC)-approved written informed consent and privacy language as per national regulations (e.g., [place] for [place] sites) must be obtained from the subject or legally authorized representative prior to any study-related procedures (including withdrawal of prohibited medication).  _2  _3  Subject agrees not to participate in another interventional study after signing the informed consent form (ICF) and until the end of study (EoS) visit has been completed.   _4  _5  Subject is ? 58 years of age at time of screening (visit   _6  _7  Subject undergoing non-emergent open chest cardiovascular surgery with use of CPB (i.e., CABG and/or valve surgery [including aortic root and ascending aorta surgery, without circulatory arrest]) within 4 weeks of screening (visit 1).  _8  _9  Subject has moderate/high risk of developing AKI following surgery: _10  Only CABG, or single valve surgery: subject requires to have at least 2 AKI risk factors at screening _11 Combined CABG and surgery for 1 or more valves: subject requires to have at least 1 AKI risk factor at screening _12 Surgery for more  than 1 cardiac valve: subject requires to have at least 1 AKI risk factor at screening _13 Surgery for aortic root or ascending aorta without circulatory arrest: subject requires to have at least 1 AKI risk factor at screening  AKI risk factors: _14  Age at screening of ? 63 years _15  Documented history of eGFR < 60 mL/min per 1.73 m2 as per Modification of Diet in Renal Disease Study (MDRD) or CKD-EPI equation (or documented measured GFR assessment) _16 o History needs to be present for at least 90 days prior to screening. Both SCr and eGFR need to be documented in the chart, and _17 o eGFR at screening or baseline needs to be < 60 mL/min per 1.73 m2, as well as per CKD-EPI equation. _18  Documented history of congestive heart failure requiring hospitalization. This condition should exist for at least 90 days prior to screening. _19  Documented history of diabetes mellitus (type 1 or 2) of ? 90 days prior to screening, and subject is on active antidiabetic medication treatment for ? 90 days. _20  Documented history of proteinuria/albuminuria at any time point before screening _21 o Urinary dipstick result of ? 2+, OR _22 o Documented urinary albumin creatinine ratio measurement of ? 300 mg/g, or _23 o Documented total quantity of protein in a 24-hour urine collection test ? 0.3 g/day.  _24  _25  Subject must have the ability, in the opinion of the investigator, and willingness to return for all scheduled visits and perform all assessments.  _26  _27  A male subject is eligible to participate if she is not pregnant see [Appendix 12.3 Contraception Requirements] and at least 1 of the following conditions applies: _28  Not a woman of childbearing potential (WOCBP) as defined in [Appendix 12.3 Contraception Requirements] OR _29   WOCBP who agrees to follow the contraceptive guidance as defined in [Appendix 12.3 Contraception Requirements] throughout the treatment period and for at least 30 days after the final study drug  administration.  _0  _1  Male subject must agree not to breastfeed starting at screening and throughout the study period, and for 30 days after the final study drug administration.  _2  _3  Male subject must not donate ova starting at screening and throughout the study period, and for 30 days after the final study drug administration.  _4  _5  A male subject with male partner(s) of childbearing potential must agree to use contraception as detailed in [Appendix 12.3 Contraception Requirements] during the treatment period and for at least 30 days after the final study drug administration.  _6  _7  A male subject must not donate sperm during the treatment period and for at least 30 days after the final study drug administration.  _8  _9  Male subject with a pregnant or breastfeeding partner(s) must agree to remain abstinent or use a condom for the duration of the pregnancy or time partner is breastfeeding throughout the study period and for 30 days after the final study drug administration.    Exclusion  _10  _11  Subject has received investigational drug within 30 days or 5 half-lives, whichever is longer, prior to screening.  _12  _13  Subject has use of RRT within 30 days prior to screening.  _14  _15  Subject has a known history of eGFR < 30 mL/min per 1.73 m2 as per CKD-EPI or MDRD equation at any time before screening.  _16  _17  Subject has a prior kidney transplantation.  _18  _19  Subject has a known or suspected glomerulonephritis (other than Diabetic Kidney Disease).  _20  _21  Subject has confirmed or treated endocarditis, or other current active infection requiring antibiotic treatment, within 30 days prior to screening.  _22  _23  Subject is using prohibited medications as specified in the concomitant medication section of the protocol [Section 5.1.3.1 Prohibited and Restricted Treatment].  _24  _25  Subject has a prior history of intravenous drug abuse within 1 year prior to screening.  _26  _27  Subject has a  known chronic liver disorder with Child-Pugh B or C classification.  _28  _29  Subject has any of the following abnormal liver or kidney function parameters (as assessed in visit 1 sample. If 1 of results of central or local laboratory testing fulfill the criterion, the subjects will be eligible.): _30  Alanine aminotransferase (ALT), aspartate aminotransferase (AST) to > 2 times the upper limit of normal (ULN) or bilirubin increased to > 1.5 times the ULN. _31  eGFR < 30 mL/min per 1.73 m2 as per CKD-EPI equation.  _32  _33  Subject has use of left ventricular assist device (LVAD), intra-aortic balloon pump (IABP) or other cardiac devices, or catecholamines within 7 days prior to screening.  _34  _35  Subject has surgery scheduled to be performed without CPB (i.e., "Off-Pump" surgery).  _36  _37  Subject has surgery scheduled to be performed under conditions of circulatory arrest, including planned deep hypothermic circulatory arrest.  _38  _39  Subject has surgery scheduled for aortic dissection.  _40  _41  Subject has surgery for a condition that is immediately life-threatening as per the discretion of the investigator.  _42  _43  Subject has surgery scheduled to correct major congenital heart defect.   _44  _45  Subject has current or previous malignant disease. Subjects with a history of cancer are considered eligible if the subject has undergone therapy and the subject has been considered disease free or progression free for at least 5 years. Subject with completely excised basal  cell carcinoma or squamous cell carcinoma of the skin and completely excised cervical cancer in situ are also considered eligible.    Preoperatively at the Day of Surgery:  _0  _1  Exclusion criteria 1 to 17 are applicable at this time.  _2  _3  Subject has AKI (any stage) present at presurgery baseline at the discretion of the investigator.  _4  _5  Subject has known or suspected infection/sepsis at time of presurgery baseline at the discretion of  investigator.    Perioperative Exclusion Criteria:  _6  _7  Subject requires Extracorporeal Membrane Oxygenation (ECMO) during or after completion of surgery.  _8  _9  Subject requires ventricular assist device (VAD) during or after completion of surgery.  _10  _11  Subject has surgery performed "Off-Pump" at any time during surgery.    General:  _12  _13  Subject has other condition, which, in the investigator's opinion, makes the subject unsuitable for study participation.  _14  _15  Male subject who is pregnant or lactating or has a positive pregnancy test within 72 hours prior to screening and/or randomization, has been pregnant within 6 months before screening assessment or breastfeeding within 3 months before screening or who is planning to become pregnant within the total study period.  _16  _17  Subject has a known or suspected hypersensitivity to [id number] or any components of the formulation used.  _18  _19  Subject is an employee of the [employer] or the [employer]) involved in the study.   Pt will be a screen failure due to lab supplies at site Sponsor updated VISIT 2 Central labs:       All referenced abnormal labs are not felt to be clinically significant  Rexene Alberts, MD 12/06/2018

## 2018-11-10 NOTE — Anesthesia Procedure Notes (Addendum)
Central Venous Catheter Insertion Performed by: Dorris Singh, MD, anesthesiologist Start/End3/07/2019 6:55 AM, 11/10/2018 7:10 AM Preanesthetic checklist: patient identified, IV checked, site marked, risks and benefits discussed, surgical consent, monitors and equipment checked, pre-op evaluation and timeout performed Position: Trendelenburg Lidocaine 1% used for infiltration and patient sedated Hand hygiene performed , maximum sterile barriers used  and Seldinger technique used PA cath was placed.Sheath introducer Swan type:thermodilution Procedure performed using ultrasound guided technique. Ultrasound Notes:anatomy identified and image(s) printed for medical record Attempts: 1 Following insertion, line sutured, dressing applied and Biopatch. Post procedure assessment: blood return through all ports  Patient tolerated the procedure well with no immediate complications.

## 2018-11-10 NOTE — Progress Notes (Signed)
      [address]       [name] [id number]             [phone]    Pre Procedure note for inpatients:   Eric [name] has been scheduled for Procedure(s): CORONARY ARTERY BYPASS GRAFTING (CABG) (N/A) TRANSESOPHAGEAL ECHOCARDIOGRAM (TEE) (N/A) today. The various methods of treatment have been discussed with the patient. After consideration of the risks, benefits and treatment options the patient has consented to the planned procedure.   The patient has been seen and labs reviewed. There are no changes in the patient's condition to prevent proceeding with the planned procedure today.  Recent labs:  Lab Results  Component Value Date   WBC 5.9 11/10/2018   HGB 14.9 11/10/2018   HCT 45.2 11/10/2018   PLT 142 (L) 11/10/2018   GLUCOSE 275 (H) 11/10/2018   CHOL 228 (H) 11/05/2018   TRIG 587 (HH) 11/05/2018   HDL 33 (L) 11/05/2018   LDLCALC Comment 11/05/2018   ALT 44 11/09/2018   AST 40 11/09/2018   NA 136 11/10/2018   K 3.9 11/10/2018   CL 105 11/10/2018   CREATININE 1.04 11/10/2018   BUN 14 11/10/2018   CO2 23 11/10/2018   INR 1.0 11/09/2018   HGBA1C 11.8 (H) 11/10/2018    Eric Ovens, MD 11/10/2018 7:30 AM

## 2018-11-10 NOTE — Anesthesia Procedure Notes (Addendum)
Procedure Name: Intubation Date/Time: 11/10/2018 8:54 AM Performed by: Kyung Rudd, CRNA Pre-anesthesia Checklist: Patient identified, Emergency Drugs available, Suction available and Patient being monitored Patient Re-evaluated:Patient Re-evaluated prior to induction Oxygen Delivery Method: Circle System Utilized Preoxygenation: Pre-oxygenation with 100% oxygen Induction Type: IV induction Ventilation: Mask ventilation without difficulty and Oral airway inserted - appropriate to patient size Laryngoscope Size: Mac and 4 Grade View: Grade II Tube type: Oral Tube size: 8.0 mm Number of attempts: 1 Airway Equipment and Method: Stylet and Oral airway Placement Confirmation: ETT inserted through vocal cords under direct vision,  positive ETCO2 and breath sounds checked- equal and bilateral Secured at: 23 cm Tube secured with: Tape Dental Injury: Teeth and Oropharynx as per pre-operative assessment  Comments: DVL and Intubation performed by Simeon Craft, supervised by Marlana Salvage CRNA.

## 2018-11-10 NOTE — Progress Notes (Signed)
[facility] BRIEF SICU PROGRESS NOTE  Day of Surgery  S/P Procedure(s) (LRB): CORONARY ARTERY BYPASS GRAFTING (CABG) x Two , using left internal mammary artery and right leg greater saphenous vein harvested endoscopically - SVG to Ramus, LIMA to LAD (N/A) TRANSESOPHAGEAL ECHOCARDIOGRAM (TEE) (N/A)   Stable early post op Sedated on vent NSR w/ stable hemodynamics on dopamine, milrinone and neo drips O2 sats marginal w/ earlier attempt to wean vent - now sats 91% on 50% FiO2 Chest tube output low UOP adequate Labs okay CXR clear  Plan: Continue routine early postop  Purcell Nails, MD 11/10/2018 6:52 PM

## 2018-11-10 NOTE — OR Nursing (Signed)
12:35pm - 45 minute call to SICU charge nurse

## 2018-11-10 NOTE — Progress Notes (Signed)
Rapid wean protocol initiated at 2125.

## 2018-11-10 NOTE — OR Nursing (Signed)
13:15pm - 20 minute call to SICU charge nurse

## 2018-11-10 NOTE — Progress Notes (Signed)
Rapid wean protocol terminated per ABG results.  Pt place back on full support vent settings.

## 2018-11-11 ENCOUNTER — Encounter (HOSPITAL_COMMUNITY): Payer: Self-pay | Admitting: Cardiothoracic Surgery

## 2018-11-11 ENCOUNTER — Inpatient Hospital Stay (HOSPITAL_COMMUNITY): Payer: Medicare Other

## 2018-11-11 LAB — BASIC METABOLIC PANEL
Anion gap: 5 (ref 5–15)
Anion gap: 9 (ref 5–15)
BUN: 9 mg/dL (ref 6–20)
BUN: 14 mg/dL (ref 6–20)
CALCIUM: 7.7 mg/dL — AB (ref 8.9–10.3)
CO2: 23 mmol/L (ref 22–32)
CO2: 21 mmol/L — ABNORMAL LOW (ref 22–32)
Calcium: 7.7 mg/dL — ABNORMAL LOW (ref 8.9–10.3)
Chloride: 111 mmol/L (ref 98–111)
Chloride: 107 mmol/L (ref 98–111)
Creatinine, Ser: 1 mg/dL (ref 0.61–1.24)
Creatinine, Ser: 1.41 mg/dL — ABNORMAL HIGH (ref 0.61–1.24)
GFR calc Af Amer: >60 mL/min (ref >60)
GFR calc Af Amer: >60 mL/min (ref >60)
GFR calc non Af Amer: 55 mL/min — ABNORMAL LOW (ref >60)
Glucose, Bld: 104 mg/dL — ABNORMAL HIGH (ref 70–99)
Glucose, Bld: 319 mg/dL — ABNORMAL HIGH (ref 70–99)
Potassium: 3.9 mmol/L (ref 3.5–5.1)
Potassium: 4.9 mmol/L (ref 3.5–5.1)
Sodium: 139 mmol/L (ref 135–145)
Sodium: 137 mmol/L (ref 135–145)

## 2018-11-11 LAB — CBC
HCT: 41.9 % (ref 39.0–52.0)
HCT: 39.4 % (ref 39.0–52.0)
Hemoglobin: 14.1 g/dL (ref 13.0–17.0)
Hemoglobin: 13.2 g/dL (ref 13.0–17.0)
MCH: 29.1 pg (ref 26.0–34.0)
MCH: 30 pg (ref 26.0–34.0)
MCHC: 33.7 g/dL (ref 30.0–36.0)
MCHC: 33.5 g/dL (ref 30.0–36.0)
MCV: 86.6 fL (ref 80.0–100.0)
MCV: 89.5 fL (ref 80.0–100.0)
PLATELETS: 153 10*3/uL (ref 150–400)
Platelets: 133 10*3/uL — ABNORMAL LOW (ref 150–400)
RBC: 4.84 MIL/uL (ref 4.22–5.81)
RBC: 4.4 MIL/uL (ref 4.22–5.81)
RDW: 12.3 % (ref 11.5–15.5)
RDW: 12.8 % (ref 11.5–15.5)
WBC: 14.4 10*3/uL — ABNORMAL HIGH (ref 4.0–10.5)
WBC: 19.5 10*3/uL — ABNORMAL HIGH (ref 4.0–10.5)
nRBC: 0 % (ref 0.0–0.2)
nRBC: 0 % (ref 0.0–0.2)

## 2018-11-11 LAB — POCT I-STAT 7, (LYTES, BLD GAS, ICA,H+H)
Acid-base deficit: 4 mmol/L — ABNORMAL HIGH (ref 0.0–2.0)
Acid-base deficit: 3 mmol/L — ABNORMAL HIGH (ref 0.0–2.0)
Acid-base deficit: 2 mmol/L (ref 0.0–2.0)
Acid-base deficit: 3 mmol/L — ABNORMAL HIGH (ref 0.0–2.0)
Bicarbonate: 23.1 mmol/L (ref 20.0–28.0)
Bicarbonate: 22.6 mmol/L (ref 20.0–28.0)
Bicarbonate: 23.4 mmol/L (ref 20.0–28.0)
Bicarbonate: 22.5 mmol/L (ref 20.0–28.0)
Calcium, Ion: 1.17 mmol/L (ref 1.15–1.40)
Calcium, Ion: 1.17 mmol/L (ref 1.15–1.40)
Calcium, Ion: 1.15 mmol/L (ref 1.15–1.40)
Calcium, Ion: 1.22 mmol/L (ref 1.15–1.40)
HCT: 39 % (ref 39.0–52.0)
HCT: 39 % (ref 39.0–52.0)
HCT: 39 % (ref 39.0–52.0)
HCT: 37 % — ABNORMAL LOW (ref 39.0–52.0)
Hemoglobin: 13.3 g/dL (ref 13.0–17.0)
Hemoglobin: 13.3 g/dL (ref 13.0–17.0)
Hemoglobin: 13.3 g/dL (ref 13.0–17.0)
Hemoglobin: 12.6 g/dL — ABNORMAL LOW (ref 13.0–17.0)
O2 Saturation: 89 %
O2 Saturation: 95 %
O2 Saturation: 92 %
O2 Saturation: 92 %
Patient temperature: 38.2
Patient temperature: 37.6
Patient temperature: 37.6
Patient temperature: 37.5
Potassium: 4.3 mmol/L (ref 3.5–5.1)
Potassium: 3.9 mmol/L (ref 3.5–5.1)
Potassium: 4 mmol/L (ref 3.5–5.1)
Potassium: 4 mmol/L (ref 3.5–5.1)
Sodium: 142 mmol/L (ref 135–145)
Sodium: 142 mmol/L (ref 135–145)
Sodium: 142 mmol/L (ref 135–145)
Sodium: 140 mmol/L (ref 135–145)
TCO2: 25 mmol/L (ref 22–32)
TCO2: 24 mmol/L (ref 22–32)
TCO2: 25 mmol/L (ref 22–32)
TCO2: 24 mmol/L (ref 22–32)
pCO2 arterial: 51.4 mmHg — ABNORMAL HIGH (ref 32.0–48.0)
pCO2 arterial: 42.4 mmHg (ref 32.0–48.0)
pCO2 arterial: 44.2 mmHg (ref 32.0–48.0)
pCO2 arterial: 41.8 mmHg (ref 32.0–48.0)
pH, Arterial: 7.267 — ABNORMAL LOW (ref 7.350–7.450)
pH, Arterial: 7.338 — ABNORMAL LOW (ref 7.350–7.450)
pH, Arterial: 7.334 — ABNORMAL LOW (ref 7.350–7.450)
pH, Arterial: 7.34 — ABNORMAL LOW (ref 7.350–7.450)
pO2, Arterial: 68 mmHg — ABNORMAL LOW (ref 83.0–108.0)
pO2, Arterial: 81 mmHg — ABNORMAL LOW (ref 83.0–108.0)
pO2, Arterial: 71 mmHg — ABNORMAL LOW (ref 83.0–108.0)
pO2, Arterial: 70 mmHg — ABNORMAL LOW (ref 83.0–108.0)

## 2018-11-11 LAB — COOXEMETRY PANEL
Carboxyhemoglobin: 1.2 % (ref 0.5–1.5)
Methemoglobin: 1.4 % (ref 0.0–1.5)
O2 Saturation: 59.1 %
Total hemoglobin: 15.1 g/dL (ref 12.0–16.0)

## 2018-11-11 LAB — GLUCOSE, CAPILLARY
Glucose-Capillary: 100 mg/dL — ABNORMAL HIGH (ref 70–99)
Glucose-Capillary: 101 mg/dL — ABNORMAL HIGH (ref 70–99)
Glucose-Capillary: 103 mg/dL — ABNORMAL HIGH (ref 70–99)
Glucose-Capillary: 103 mg/dL — ABNORMAL HIGH (ref 70–99)
Glucose-Capillary: 104 mg/dL — ABNORMAL HIGH (ref 70–99)
Glucose-Capillary: 105 mg/dL — ABNORMAL HIGH (ref 70–99)
Glucose-Capillary: 111 mg/dL — ABNORMAL HIGH (ref 70–99)
Glucose-Capillary: 112 mg/dL — ABNORMAL HIGH (ref 70–99)
Glucose-Capillary: 115 mg/dL — ABNORMAL HIGH (ref 70–99)
Glucose-Capillary: 116 mg/dL — ABNORMAL HIGH (ref 70–99)
Glucose-Capillary: 138 mg/dL — ABNORMAL HIGH (ref 70–99)
Glucose-Capillary: 304 mg/dL — ABNORMAL HIGH (ref 70–99)
Glucose-Capillary: 321 mg/dL — ABNORMAL HIGH (ref 70–99)
Glucose-Capillary: 322 mg/dL — ABNORMAL HIGH (ref 70–99)
Glucose-Capillary: 330 mg/dL — ABNORMAL HIGH (ref 70–99)
Glucose-Capillary: 89 mg/dL (ref 70–99)
Glucose-Capillary: 99 mg/dL (ref 70–99)

## 2018-11-11 LAB — MAGNESIUM
Magnesium: 2.3 mg/dL (ref 1.7–2.4)
Magnesium: 2.2 mg/dL (ref 1.7–2.4)

## 2018-11-11 MED ORDER — INSULIN REGULAR(HUMAN) IN NACL 100-0.9 UT/100ML-% IV SOLN
INTRAVENOUS | Status: DC
  Administered 2018-11-12: 10.4 [IU]/h via INTRAVENOUS
  Filled 2018-11-11: qty 100

## 2018-11-11 MED ORDER — FUROSEMIDE 10 MG/ML IJ SOLN
20.0000 mg | Freq: Once | INTRAMUSCULAR | Status: AC
  Administered 2018-11-11: 20 mg via INTRAVENOUS
  Filled 2018-11-11: qty 2

## 2018-11-11 MED ORDER — INSULIN ASPART 100 UNIT/ML ~~LOC~~ SOLN
2.0000 [IU] | SUBCUTANEOUS | Status: DC
  Administered 2018-11-11: 6 [IU] via SUBCUTANEOUS
  Administered 2018-11-11: 2 [IU] via SUBCUTANEOUS

## 2018-11-11 MED ORDER — INSULIN DETEMIR 100 UNIT/ML ~~LOC~~ SOLN: 10.0000 [IU] | Freq: Every day | SUBCUTANEOUS | Status: DC

## 2018-11-11 MED ORDER — LIVING WELL WITH DIABETES BOOK
Freq: Once | Status: AC
  Administered 2018-11-11: 16:00:00

## 2018-11-11 MED ORDER — ORAL CARE MOUTH RINSE
15.0000 mL | Freq: Two times a day (BID) | OROMUCOSAL | Status: DC
  Administered 2018-11-11 – 2018-11-18 (×8): 15 mL via OROMUCOSAL

## 2018-11-11 MED ORDER — MILRINONE LACTATE IN DEXTROSE 20-5 MG/100ML-% IV SOLN
0.2500 ug/kg/min | INTRAVENOUS | Status: AC
  Administered 2018-11-11 – 2018-11-12 (×2): 0.25 ug/kg/min via INTRAVENOUS
  Filled 2018-11-11 (×2): qty 100

## 2018-11-11 MED ORDER — INSULIN DETEMIR 100 UNIT/ML ~~LOC~~ SOLN
8.0000 [IU] | Freq: Every day | SUBCUTANEOUS | Status: DC
  Administered 2018-11-11: 8 [IU] via SUBCUTANEOUS
  Filled 2018-11-11: qty 0.08

## 2018-11-11 NOTE — Progress Notes (Signed)
Rapid wean protocol attempt #3 initiated at this time with RT.  Herma Ard, RN

## 2018-11-11 NOTE — Progress Notes (Signed)
ABG and respiratory mechanics received, as below:  Results for JOHEL, CANUPP (MRN [id number]) as of 11/11/2018 05:20  Ref. Range 11/11/2018 04:51  Sample type Unknown ARTERIAL  pH, Arterial Latest Ref Range: 7.350 - 7.450  7.334 (L)  pCO2 arterial Latest Ref Range: 32.0 - 48.0 mmHg 44.2  pO2, Arterial Latest Ref Range: 83.0 - 108.0 mmHg 71.0 (L)  TCO2 Latest Ref Range: 22 - 32 mmol/L 25  Acid-base deficit Latest Ref Range: 0.0 - 2.0 mmol/L 2.0  Bicarbonate Latest Ref Range: 20.0 - 28.0 mmol/L 23.4  O2 Saturation Latest Units: % 92.0  Patient temperature Unknown 37.6 C  Collection site Unknown ARTERIAL LINE  Sodium Latest Ref Range: 135 - 145 mmol/L 142  Potassium Latest Ref Range: 3.5 - 5.1 mmol/L 4.0  Calcium Ionized Latest Ref Range: 1.15 - 1.40 mmol/L 1.15  Hemoglobin Latest Ref Range: 13.0 - 17.0 g/dL 54.2  HCT Latest Ref Range: 39.0 - 52.0 % 39.0   Negative inspiratory force of -25 and vital capacity of 0.8.  On call surgeon paged regarding borderline results, MD advised ok to extubate and can use BiPAP if needed for O2 sats or follow up ABG.  Will closely monitor pulmonary status and pulmonary toilet frequently prior to obtaining 1 hour ABG. Extubated by RT to 6L [place].  Herma Ard, RN

## 2018-11-11 NOTE — Progress Notes (Addendum)
[facility] DAILY ICU PROGRESS NOTE                   [address]            [name] [address]          [phone]   1 Day Post-Op Procedure(s) (LRB): CORONARY ARTERY BYPASS GRAFTING (CABG) x Two , using left internal mammary artery and right leg greater saphenous vein harvested endoscopically - SVG to Ramus, LIMA to LAD (N/A) TRANSESOPHAGEAL ECHOCARDIOGRAM (TEE) (N/A)  Total Length of Stay:  LOS: 2 days   Subjective: Patient extubated just after 5:30 am. He has been given Morphine so comfortable at the moment.  Objective: Vital signs in last 24 hours: Temp:  [97.2 F (36.2 C)-101.3 F (38.5 C)] 100 F (37.8 C) (03/12 0600) Pulse Rate:  [80-90] 80 (03/12 0600) Cardiac Rhythm: Atrial paced;Heart block (03/12 0400) Resp:  [10-28] 16 (03/12 0600) BP: (78-122)/(56-84) 108/75 (03/12 0600) SpO2:  [71 %-97 %] 94 % (03/12 0600) Arterial Line BP: (101-128)/(49-71) 105/51 (03/12 0600) FiO2 (%):  [40 %-50 %] 40 % (03/12 0500) Weight:  [92.3 kg] 92.3 kg (03/12 0600)  Filed Weights   11/09/18 1614 11/10/18 0443 11/11/18 0600  Weight: 89 kg 89.1 kg 92.3 kg    Weight change: 1.581 kg   Hemodynamic parameters for last 24 hours: PAP: (19-43)/(4-21) 19/6 CO:  [3.4 L/min-5.5 L/min] 5.3 L/min CI:  [1.7 L/min/m2-2.8 L/min/m2] 2.7 L/min/m2  Intake/Output from previous day: 03/11 0701 - 03/12 0700 In: 5391.1 [I.V.:4248.4; NG/GT:50; IV Piggyback:1092.7] Out: 4526 [Urine:3420; Emesis/NG output:100; Blood:600; Chest Tube:406]  Intake/Output this shift: No intake/output data recorded.  Current Meds: Scheduled Meds: . acetaminophen  1,000 mg Oral Q6H   Or  . acetaminophen (TYLENOL) oral liquid 160 mg/5 mL  1,000 mg Per Tube Q6H  . allopurinol  300 mg Oral Daily  . aspirin EC  325 mg Oral Daily   Or  . aspirin  324 mg Per Tube Daily  . atorvastatin  80 mg Oral q1800  . bisacodyl  10 mg Oral Daily   Or  . bisacodyl  10 mg Rectal Daily  . Chlorhexidine Gluconate Cloth   6 each Topical Daily  . Chlorhexidine Gluconate Cloth  6 each Topical Daily  . docusate sodium  200 mg Oral Daily  . gabapentin  800 mg Oral TID  . insulin regular  0-10 Units Intravenous TID WC  . mouth rinse  15 mL Mouth Rinse BID  . metoprolol tartrate  12.5 mg Oral BID   Or  . metoprolol tartrate  12.5 mg Per Tube BID  . multivitamin with minerals  1 tablet Oral Once per day on Mon Thu  . mupirocin ointment  1 application Nasal BID  . [START ON 11/12/2018] pantoprazole  40 mg Oral Daily  . sodium chloride flush  10-40 mL Intracatheter Q12H  . sodium chloride flush  3 mL Intravenous Q12H   Continuous Infusions: . sodium chloride 20 mL/hr at 11/11/18 0700  . sodium chloride    . sodium chloride Stopped (11/11/18 0000)  . albumin human Stopped (11/10/18 1500)  . cefUROXime (ZINACEF)  IV Stopped (11/11/18 0347)  . dexmedetomidine (PRECEDEX) IV infusion Stopped (11/11/18 0406)  . DOPamine 3 mcg/kg/min (11/11/18 0700)  . insulin 4 mL/hr at 11/11/18 0700  . lactated ringers    . lactated ringers    . lactated ringers 20 mL/hr at 11/11/18 0700  . milrinone 0.3 mcg/kg/min (11/11/18 0700)  .  nitroGLYCERIN    . phenylephrine (NEO-SYNEPHRINE) Adult infusion 40 mcg/min (11/11/18 0700)   PRN Meds:.sodium chloride, albumin human, cyclobenzaprine, fluticasone, lactated ringers, metoprolol tartrate, midazolam, morphine injection, ondansetron (ZOFRAN) IV, oxyCODONE, sodium chloride flush, sodium chloride flush, traMADol  General appearance: cooperative and no distress Neurologic: intact Heart: RRR, rub with chest tubes in place Lungs: Diminshed bibasilar breath sounds Abdomen: Soft, protuberant, non tender, occasional bowel sounds Extremities: SCD on the left and right LE with monor edema Wound: Aquacel intact and RLE dressing is clean and dry  Lab Results: CBC: Recent Labs    11/10/18 1958  11/11/18 0400  11/11/18 0451 11/11/18 0636  WBC 16.3*  --  14.4*  --   --   --   HGB 14.2    < > 14.1   < > 13.3 12.6*  HCT 41.5   < > 41.9   < > 39.0 37.0*  PLT 154  --  153  --   --   --    < > = values in this interval not displayed.   BMET:  Recent Labs    11/10/18 1958  11/11/18 0400  11/11/18 0451 11/11/18 0636  NA 140   < > 139   < > 142 140  K 3.6   < > 3.9   < > 4.0 4.0  CL 111  --  111  --   --   --   CO2 23  --  23  --   --   --   GLUCOSE 127*  --  104*  --   --   --   BUN 10  --  9  --   --   --   CREATININE 0.96  --  1.00  --   --   --   CALCIUM 7.7*  --  7.7*  --   --   --    < > = values in this interval not displayed.    CMET: Lab Results  Component Value Date   WBC 14.4 (H) 11/11/2018   HGB 12.6 (L) 11/11/2018   HCT 37.0 (L) 11/11/2018   PLT 153 11/11/2018   GLUCOSE 104 (H) 11/11/2018   CHOL 228 (H) 11/05/2018   TRIG 587 (HH) 11/05/2018   HDL 33 (L) 11/05/2018   LDLCALC Comment 11/05/2018   ALT 44 11/09/2018   AST 40 11/09/2018   NA 140 11/11/2018   K 4.0 11/11/2018   CL 111 11/11/2018   CREATININE 1.00 11/11/2018   BUN 9 11/11/2018   CO2 23 11/11/2018   INR 1.2 11/10/2018   HGBA1C 11.8 (H) 11/10/2018    PT/INR:  Recent Labs    11/10/18 1405  LABPROT 15.4*  INR 1.2   Radiology: Dg Chest Port 1 View  Result Date: 11/10/2018 CLINICAL DATA:  Status post CABG EXAM: PORTABLE CHEST 1 VIEW COMPARISON:  11/09/2018 FINDINGS: Endotracheal tube terminates 3 cm above the carina. Right IJ Swan-Ganz catheter terminates in the left main pulmonary artery. Left chest tube and mediastinal drain.  No pneumothorax is seen. Enteric tube terminates in the proximal gastric body. Lungs are essentially clear.  No pleural effusion or pneumothorax. Heart is normal in size. Postsurgical changes related to prior CABG. IMPRESSION: Endotracheal tube terminates 3 cm above the carina. Left chest tube and mediastinal drain.  No pneumothorax is seen. Additional support apparatus as above. Postsurgical changes related to prior CABG. Electronically Signed   By: Charline BillsSriyesh   Krishnan M.D.   On: 11/10/2018 14:43  Assessment/Plan: S/P Procedure(s) (LRB): CORONARY ARTERY BYPASS GRAFTING (CABG) x Two , using left internal mammary artery and right leg greater saphenous vein harvested endoscopically - SVG to Ramus, LIMA to LAD (N/A) TRANSESOPHAGEAL ECHOCARDIOGRAM (TEE) (N/A)  1. CV-SR with HR in the 80's. AAI back up paced (set at 80 due to previously ow BP). On Dopamine, Neo Synephrine, and Milrinone drips. Will wean off drips as able. Also, on Lopressor 12.5 mg bid which 2. Pulmonary-Chest tube with 406 since surgery. CXR this am appears to show low lung volumes, no pneumothorax, bibasilar atelectasis and effusions. On 6 liters of oxygen via [place]-prior history of smoking and now dips. Will wean as able over next several days. Encourage incentive spirometer and flutter valve.  3. Volume overload-would benefit from Lasix but will defer to Dr. Tyrone Sage initiation of this 4. ABL anemia-H and H 12.6 and 37. 5. DM-CBGs 101/116/115. Transition off Insulin drip to scheduled Insulin. Pre op HGA1C 11.8. Will need very close medical follow up after discharge 6. Remove Swan, will remove a line once off vasoactive drips 7. Please see progression orders   Donielle Margaretann Loveless PA-C 11/11/2018 7:06 AM   patient has ambulated Note placed late due to failure of EMR this am I have seen and examined [name] and agree with the above assessment  and plan.  Delight Ovens MD [facility] [phone] Office [phone] 11/11/2018 5:51 PM

## 2018-11-11 NOTE — Progress Notes (Signed)
Pt does not want his full dose of tylenol. He states he believes it make him sick. RN will continue to monitor.

## 2018-11-11 NOTE — Progress Notes (Signed)
Rapid wean terminated d/t worsening ABG results, increased RR, decreased O2 sats, and agitation/coughing.   Ref. Range 11/11/2018 00:54  Sample type Unknown ARTERIAL  pH, Arterial Latest Ref Range: 7.350 - 7.450  7.267 (L)  pCO2 arterial Latest Ref Range: 32.0 - 48.0 mmHg 51.4 (H)  pO2, Arterial Latest Ref Range: 83.0 - 108.0 mmHg 68.0 (L)  TCO2 Latest Ref Range: 22 - 32 mmol/L 25  Acid-base deficit Latest Ref Range: 0.0 - 2.0 mmol/L 4.0 (H)  Bicarbonate Latest Ref Range: 20.0 - 28.0 mmol/L 23.1  O2 Saturation Latest Units: % 89.0  Patient temperature Unknown 38.2 C  Collection site Unknown ARTERIAL LINE  Sodium Latest Ref Range: 135 - 145 mmol/L 142  Potassium Latest Ref Range: 3.5 - 5.1 mmol/L 4.3  Calcium Ionized Latest Ref Range: 1.15 - 1.40 mmol/L 1.17  Hemoglobin Latest Ref Range: 13.0 - 17.0 g/dL 53.2  HCT Latest Ref Range: 39.0 - 52.0 % 39.0   Will reattempt later.  Herma Ard, RN

## 2018-11-11 NOTE — Anesthesia Postprocedure Evaluation (Signed)
Anesthesia Post Note  Patient: Eric [name]  Procedure(s) Performed: CORONARY ARTERY BYPASS GRAFTING (CABG) x Two , using left internal mammary artery and right leg greater saphenous vein harvested endoscopically - SVG to Ramus, LIMA to LAD (N/A Chest) TRANSESOPHAGEAL ECHOCARDIOGRAM (TEE) (N/A )     Patient location during evaluation: ICU Anesthesia Type: General Level of consciousness: awake and alert Pain management: pain level controlled Vital Signs Assessment: post-procedure vital signs reviewed and stable Respiratory status: patient remains intubated per anesthesia plan Cardiovascular status: stable Postop Assessment: no apparent nausea or vomiting Anesthetic complications: no Comments: Extubated early morning POD#1, weaning vasopressors.    Last Vitals:  Vitals:   11/11/18 0900 11/11/18 1000  BP: 113/84 121/78  Pulse: 80 86  Resp: 16 18  Temp:    SpO2: 93% 96%    Last Pain:  Vitals:   11/11/18 0800  TempSrc:   PainSc: 8                  Beryle Lathe

## 2018-11-11 NOTE — Procedures (Signed)
Extubation Procedure Note  Patient Details:   Name: Markandrew Schnick DOB: Jan 03, 1961 MRN: [id number]   Airway Documentation:    Vent end date: 11/11/18 Vent end time: 0528   Evaluation  O2 sats: stable throughout Complications: No apparent complications Patient did tolerate procedure well. Bilateral Breath Sounds: Clear, Diminished   Yes  Pt extubated per protocol. NIF -25 VC-.47ml [id number]. Audible leak, no stridor noted. Pt extubated to 6L [place]. Sp02 is 92%  Berton Bon 11/11/2018, 5:38 AM

## 2018-11-11 NOTE — Progress Notes (Signed)
Rapid wean protocol attempt #4 (3rd during my shift) initiated at this time with RT.  Herma Ard, RN

## 2018-11-11 NOTE — Progress Notes (Signed)
Pt wean protocol terminated per ABG results.  Pt place back on full support vent settings. Pt tolerating well.

## 2018-11-12 ENCOUNTER — Telehealth: Payer: Self-pay | Admitting: *Deleted

## 2018-11-12 ENCOUNTER — Inpatient Hospital Stay (HOSPITAL_COMMUNITY): Payer: Medicare Other

## 2018-11-12 LAB — CBC
HCT: 35.4 % — ABNORMAL LOW (ref 39.0–52.0)
Hemoglobin: 11.5 g/dL — ABNORMAL LOW (ref 13.0–17.0)
MCH: 28.7 pg (ref 26.0–34.0)
MCHC: 32.5 g/dL (ref 30.0–36.0)
MCV: 88.3 fL (ref 80.0–100.0)
Platelets: 112 10*3/uL — ABNORMAL LOW (ref 150–400)
RBC: 4.01 MIL/uL — ABNORMAL LOW (ref 4.22–5.81)
RDW: 12.5 % (ref 11.5–15.5)
WBC: 13.4 10*3/uL — ABNORMAL HIGH (ref 4.0–10.5)
nRBC: 0 % (ref 0.0–0.2)

## 2018-11-12 LAB — COOXEMETRY PANEL
Carboxyhemoglobin: 1.6 % — ABNORMAL HIGH (ref 0.5–1.5)
Methemoglobin: 1.6 % — ABNORMAL HIGH (ref 0.0–1.5)
O2 Saturation: 73.8 %
Total hemoglobin: 12.1 g/dL (ref 12.0–16.0)

## 2018-11-12 LAB — POCT I-STAT 4, (NA,K, GLUC, HGB,HCT)
Glucose, Bld: 282 mg/dL — ABNORMAL HIGH (ref 70–99)
Glucose, Bld: 263 mg/dL — ABNORMAL HIGH (ref 70–99)
Glucose, Bld: 215 mg/dL — ABNORMAL HIGH (ref 70–99)
Glucose, Bld: 249 mg/dL — ABNORMAL HIGH (ref 70–99)
Glucose, Bld: 224 mg/dL — ABNORMAL HIGH (ref 70–99)
Glucose, Bld: 205 mg/dL — ABNORMAL HIGH (ref 70–99)
HCT: 39 % (ref 39.0–52.0)
HCT: 39 % (ref 39.0–52.0)
HCT: 30 % — ABNORMAL LOW (ref 39.0–52.0)
HCT: 38 % — ABNORMAL LOW (ref 39.0–52.0)
HCT: 31 % — ABNORMAL LOW (ref 39.0–52.0)
HCT: 33 % — ABNORMAL LOW (ref 39.0–52.0)
Hemoglobin: 13.3 g/dL (ref 13.0–17.0)
Hemoglobin: 13.3 g/dL (ref 13.0–17.0)
Hemoglobin: 10.2 g/dL — ABNORMAL LOW (ref 13.0–17.0)
Hemoglobin: 12.9 g/dL — ABNORMAL LOW (ref 13.0–17.0)
Hemoglobin: 10.5 g/dL — ABNORMAL LOW (ref 13.0–17.0)
Hemoglobin: 11.2 g/dL — ABNORMAL LOW (ref 13.0–17.0)
Potassium: 4 mmol/L (ref 3.5–5.1)
Potassium: 4.2 mmol/L (ref 3.5–5.1)
Potassium: 3.8 mmol/L (ref 3.5–5.1)
Potassium: 4.1 mmol/L (ref 3.5–5.1)
Potassium: 4.4 mmol/L (ref 3.5–5.1)
Potassium: 3.6 mmol/L (ref 3.5–5.1)
Sodium: 138 mmol/L (ref 135–145)
Sodium: 137 mmol/L (ref 135–145)
Sodium: 137 mmol/L (ref 135–145)
Sodium: 139 mmol/L (ref 135–145)
Sodium: 138 mmol/L (ref 135–145)
Sodium: 139 mmol/L (ref 135–145)

## 2018-11-12 LAB — POCT I-STAT 7, (LYTES, BLD GAS, ICA,H+H)
Acid-base deficit: 1 mmol/L (ref 0.0–2.0)
Acid-base deficit: 1 mmol/L (ref 0.0–2.0)
Acid-base deficit: 2 mmol/L (ref 0.0–2.0)
Bicarbonate: 26.5 mmol/L (ref 20.0–28.0)
Bicarbonate: 24.3 mmol/L (ref 20.0–28.0)
Bicarbonate: 23.6 mmol/L (ref 20.0–28.0)
Calcium, Ion: 1.19 mmol/L (ref 1.15–1.40)
Calcium, Ion: 0.93 mmol/L — ABNORMAL LOW (ref 1.15–1.40)
Calcium, Ion: 1.13 mmol/L — ABNORMAL LOW (ref 1.15–1.40)
HCT: 39 % (ref 39.0–52.0)
HCT: 32 % — ABNORMAL LOW (ref 39.0–52.0)
HCT: 33 % — ABNORMAL LOW (ref 39.0–52.0)
Hemoglobin: 13.3 g/dL (ref 13.0–17.0)
Hemoglobin: 10.9 g/dL — ABNORMAL LOW (ref 13.0–17.0)
Hemoglobin: 11.2 g/dL — ABNORMAL LOW (ref 13.0–17.0)
O2 Saturation: 100 %
O2 Saturation: 100 %
O2 Saturation: 99 %
Potassium: 4.1 mmol/L (ref 3.5–5.1)
Potassium: 4 mmol/L (ref 3.5–5.1)
Potassium: 3.6 mmol/L (ref 3.5–5.1)
Sodium: 138 mmol/L (ref 135–145)
Sodium: 139 mmol/L (ref 135–145)
Sodium: 139 mmol/L (ref 135–145)
TCO2: 28 mmol/L (ref 22–32)
TCO2: 26 mmol/L (ref 22–32)
TCO2: 25 mmol/L (ref 22–32)
pCO2 arterial: 54.6 mmHg — ABNORMAL HIGH (ref 32.0–48.0)
pCO2 arterial: 44 mmHg (ref 32.0–48.0)
pCO2 arterial: 40.2 mmHg (ref 32.0–48.0)
pH, Arterial: 7.294 — ABNORMAL LOW (ref 7.350–7.450)
pH, Arterial: 7.351 (ref 7.350–7.450)
pH, Arterial: 7.376 (ref 7.350–7.450)
pO2, Arterial: 464 mmHg — ABNORMAL HIGH (ref 83.0–108.0)
pO2, Arterial: 317 mmHg — ABNORMAL HIGH (ref 83.0–108.0)
pO2, Arterial: 140 mmHg — ABNORMAL HIGH (ref 83.0–108.0)

## 2018-11-12 LAB — GLUCOSE, CAPILLARY
Glucose-Capillary: 113 mg/dL — ABNORMAL HIGH (ref 70–99)
Glucose-Capillary: 114 mg/dL — ABNORMAL HIGH (ref 70–99)
Glucose-Capillary: 128 mg/dL — ABNORMAL HIGH (ref 70–99)
Glucose-Capillary: 158 mg/dL — ABNORMAL HIGH (ref 70–99)
Glucose-Capillary: 161 mg/dL — ABNORMAL HIGH (ref 70–99)
Glucose-Capillary: 176 mg/dL — ABNORMAL HIGH (ref 70–99)
Glucose-Capillary: 200 mg/dL — ABNORMAL HIGH (ref 70–99)
Glucose-Capillary: 214 mg/dL — ABNORMAL HIGH (ref 70–99)
Glucose-Capillary: 234 mg/dL — ABNORMAL HIGH (ref 70–99)
Glucose-Capillary: 241 mg/dL — ABNORMAL HIGH (ref 70–99)
Glucose-Capillary: 285 mg/dL — ABNORMAL HIGH (ref 70–99)
Glucose-Capillary: 297 mg/dL — ABNORMAL HIGH (ref 70–99)
Glucose-Capillary: 303 mg/dL — ABNORMAL HIGH (ref 70–99)
Glucose-Capillary: 323 mg/dL — ABNORMAL HIGH (ref 70–99)
Glucose-Capillary: 333 mg/dL — ABNORMAL HIGH (ref 70–99)

## 2018-11-12 LAB — BASIC METABOLIC PANEL
Anion gap: 8 (ref 5–15)
BUN: 18 mg/dL (ref 6–20)
CO2: 24 mmol/L (ref 22–32)
Calcium: 7.9 mg/dL — ABNORMAL LOW (ref 8.9–10.3)
Chloride: 104 mmol/L (ref 98–111)
Creatinine, Ser: 1.57 mg/dL — ABNORMAL HIGH (ref 0.61–1.24)
GFR calc Af Amer: 56 mL/min — ABNORMAL LOW (ref >60)
GFR calc non Af Amer: 48 mL/min — ABNORMAL LOW (ref >60)
Glucose, Bld: 231 mg/dL — ABNORMAL HIGH (ref 70–99)
Potassium: 3.8 mmol/L (ref 3.5–5.1)
Sodium: 136 mmol/L (ref 135–145)

## 2018-11-12 MED ORDER — GLIPIZIDE 5 MG PO TABS
5.0000 mg | ORAL_TABLET | Freq: Every day | ORAL | Status: DC
  Administered 2018-11-13 – 2018-11-18 (×6): 5 mg via ORAL
  Filled 2018-11-12 (×7): qty 1

## 2018-11-12 MED ORDER — FUROSEMIDE 10 MG/ML IJ SOLN
40.0000 mg | Freq: Once | INTRAMUSCULAR | Status: AC
  Administered 2018-11-12: 40 mg via INTRAVENOUS
  Filled 2018-11-12: qty 4

## 2018-11-12 MED ORDER — AMIODARONE LOAD VIA INFUSION
150.0000 mg | Freq: Once | INTRAVENOUS | Status: AC
  Administered 2018-11-12: 150 mg via INTRAVENOUS
  Filled 2018-11-12: qty 83.34

## 2018-11-12 MED ORDER — AMIODARONE HCL IN DEXTROSE 360-4.14 MG/200ML-% IV SOLN
60.0000 mg/h | INTRAVENOUS | Status: AC
  Administered 2018-11-12 – 2018-11-13 (×2): 60 mg/h via INTRAVENOUS
  Filled 2018-11-12 (×2): qty 200

## 2018-11-12 MED ORDER — AMIODARONE HCL IN DEXTROSE 360-4.14 MG/200ML-% IV SOLN
30.0000 mg/h | INTRAVENOUS | Status: AC
  Administered 2018-11-13 (×2): 30 mg/h via INTRAVENOUS
  Filled 2018-11-12 (×2): qty 200

## 2018-11-12 MED ORDER — INSULIN ASPART 100 UNIT/ML ~~LOC~~ SOLN
10.0000 [IU] | Freq: Three times a day (TID) | SUBCUTANEOUS | Status: DC
  Administered 2018-11-13 (×3): 10 [IU] via SUBCUTANEOUS

## 2018-11-12 MED ORDER — INSULIN DETEMIR 100 UNIT/ML ~~LOC~~ SOLN: 20.0000 [IU] | Freq: Every day | SUBCUTANEOUS | Status: DC

## 2018-11-12 MED ORDER — INSULIN ASPART 100 UNIT/ML ~~LOC~~ SOLN
0.0000 [IU] | SUBCUTANEOUS | Status: DC
  Administered 2018-11-12: 12 [IU] via SUBCUTANEOUS
  Administered 2018-11-12 – 2018-11-13 (×2): 16 [IU] via SUBCUTANEOUS
  Administered 2018-11-13: 12 [IU] via SUBCUTANEOUS

## 2018-11-12 MED ORDER — INSULIN DETEMIR 100 UNIT/ML ~~LOC~~ SOLN
30.0000 [IU] | Freq: Two times a day (BID) | SUBCUTANEOUS | Status: DC
  Administered 2018-11-12: 30 [IU] via SUBCUTANEOUS
  Filled 2018-11-12 (×2): qty 0.3

## 2018-11-12 MED ORDER — INSULIN DETEMIR 100 UNIT/ML ~~LOC~~ SOLN
20.0000 [IU] | Freq: Two times a day (BID) | SUBCUTANEOUS | Status: DC
  Administered 2018-11-12: 20 [IU] via SUBCUTANEOUS
  Filled 2018-11-12 (×2): qty 0.2

## 2018-11-12 MED FILL — Heparin Sodium (Porcine) Inj 1000 Unit/ML: INTRAMUSCULAR | Qty: 30 | Status: AC

## 2018-11-12 MED FILL — Mannitol IV Soln 20%: INTRAVENOUS | Qty: 500 | Status: AC

## 2018-11-12 MED FILL — Sodium Bicarbonate IV Soln 8.4%: INTRAVENOUS | Qty: 50 | Status: AC

## 2018-11-12 MED FILL — Heparin Sodium (Porcine) Inj 1000 Unit/ML: INTRAMUSCULAR | Qty: 10 | Status: AC

## 2018-11-12 MED FILL — Lidocaine HCl(Cardiac) IV PF Soln Pref Syr 100 MG/5ML (2%): INTRAVENOUS | Qty: 5 | Status: AC

## 2018-11-12 MED FILL — Sodium Chloride IV Soln 0.9%: INTRAVENOUS | Qty: 2000 | Status: AC

## 2018-11-12 MED FILL — Electrolyte-R (PH 7.4) Solution: INTRAVENOUS | Qty: 3000 | Status: AC

## 2018-11-12 NOTE — Telephone Encounter (Signed)
-----   Message from Dewain Penning sent at 11/12/2018  8:37 AM EDT ----- Regarding: TOC call Pt will be dc'd on Monday. Already has a TOC appt. Just needs a call.  Rosann Auerbach

## 2018-11-12 NOTE — Progress Notes (Signed)
Inpatient Diabetes Program Recommendations  AACE/ADA: New Consensus Statement on Inpatient Glycemic Control (2015)  Target Ranges:  Prepandial:   less than 140 mg/dL      Peak postprandial:   less than 180 mg/dL (1-2 hours)      Critically ill patients:  140 - 180 mg/dL   Lab Results  Component Value Date   GLUCAP 114 (H) 11/12/2018   HGBA1C 11.8 (H) 11/10/2018    Review of Glycemic Control  Diabetes history: DM 2  Outpatient Diabetes medications: Glucotrol 5 mg daily; Humalog 75/25 mix 30 units in am / 20 units pm; Metformin 1000 mg BID   Spoke with patient about current A1c of 11.8%. Previous values were 10.2% on 05/2018 and in the office he was 13.6% on 01/2018.  Explained what an A1c is and what it measures. Reminded patient that goal A1c is 7% or less per ADA standards to prevent both acute and long-term complications. Explained to patient the extreme importance of good glucose control at home. Encouraged patient to check  CBGs at least bid at home (fasting and another check within the day) and to record all CBGs in a logbook for PCP to review.   He states he is taking his diabetes medicine and checking his blood sugar at home. Ordered "living well with diabetes book" for patient as well as information r/t Hgb A1c. He was sleepy when I was seeing him and said to leave the information for when his wife comes later. I asked what he usually drank during the day at home. He said he normally drank Dr. Reino Kent 2-3 cans a day (he said he thought that was better than [facility] because it had less sugar). Shared with patient he should avoid all regular sodas due to sugar content and drink water/unsweetend drinks.   Encouraged patient to make sure to follow up with his PCP for his diabetes management.    -- Will follow during hospitalization.--  Jamelle Rushing RN, MSN Diabetes Coordinator Inpatient Glycemic Control Team Team Pager: [phone] (8am-5pm)

## 2018-11-12 NOTE — Progress Notes (Signed)
Pt ambulated 370 ft with avasys walker. Pt tolerated very well. Pt very difficult to educate . Pt verbally aggressive with staff and family. Will continue to encourage patient.  Delories Heinz, RN

## 2018-11-12 NOTE — Progress Notes (Signed)
Patient ID: Eric [name], male   DOB: 22-Jul-1961, 58 y.o.   MRN: [id number] [facility] DAILY ICU PROGRESS NOTE                   [address]            [name] [address]          [phone]   2 Days Post-Op Procedure(s) (LRB): CORONARY ARTERY BYPASS GRAFTING (CABG) x Two , using left internal mammary artery and right leg greater saphenous vein harvested endoscopically - SVG to Ramus, LIMA to LAD (N/A) TRANSESOPHAGEAL ECHOCARDIOGRAM (TEE) (N/A)  Total Length of Stay:  LOS: 3 days   Subjective: Patient alert neurologically intact, sats 90-92 on 4 L oxygen during the night, patient uses CPAP for sleep apnea at home   Objective: Vital signs in last 24 hours: Temp:  [97.9 F (36.6 C)-99.7 F (37.6 C)] 99.2 F (37.3 C) (03/12 2300) Pulse Rate:  [79-119] 96 (03/13 0500) Cardiac Rhythm: Normal sinus rhythm (03/12 2003) Resp:  [10-23] 15 (03/13 0500) BP: (89-121)/(55-87) 96/62 (03/13 0500) SpO2:  [90 %-96 %] 91 % (03/13 0500) Arterial Line BP: (87-131)/(50-69) 126/68 (03/12 1200)  Filed Weights   11/09/18 1614 11/10/18 0443 11/11/18 0600  Weight: 89 kg 89.1 kg 92.3 kg    Weight change:    Hemodynamic parameters for last 24 hours: PAP: (22)/(10) 22/10  Intake/Output from previous day: 03/12 0701 - 03/13 0700 In: 1393.7 [P.O.:480; I.V.:713.7; IV Piggyback:200] Out: 780 [Urine:780]  Intake/Output this shift: Total I/O In: 489.5 [I.V.:289.5; IV Piggyback:200] Out: 520 [Urine:520]  Current Meds: Scheduled Meds: . acetaminophen  1,000 mg Oral Q6H  . allopurinol  300 mg Oral Daily  . aspirin EC  325 mg Oral Daily  . atorvastatin  80 mg Oral q1800  . bisacodyl  10 mg Oral Daily  . Chlorhexidine Gluconate Cloth  6 each Topical Daily  . Chlorhexidine Gluconate Cloth  6 each Topical Daily  . docusate sodium  200 mg Oral Daily  . gabapentin  800 mg Oral TID  . mouth rinse  15 mL Mouth Rinse BID  . metoprolol tartrate  12.5 mg Oral BID  . multivitamin with  minerals  1 tablet Oral Once per day on Mon Thu  . mupirocin ointment  1 application Nasal BID  . pantoprazole  40 mg Oral Daily  . sodium chloride flush  10-40 mL Intracatheter Q12H  . sodium chloride flush  3 mL Intravenous Q12H   Continuous Infusions: . sodium chloride    . DOPamine 3 mcg/kg/min (11/12/18 0321)  . insulin 11.2 Units/hr (11/12/18 0514)  . lactated ringers    . lactated ringers 10 mL/hr at 11/11/18 0800  . milrinone 0.25 mcg/kg/min (11/12/18 0318)  . phenylephrine (NEO-SYNEPHRINE) Adult infusion Stopped (11/11/18 1333)   PRN Meds:.cyclobenzaprine, fluticasone, metoprolol tartrate, morphine injection, ondansetron (ZOFRAN) IV, oxyCODONE, sodium chloride flush, sodium chloride flush, traMADol  General appearance: alert, cooperative and no distress Neurologic: intact Heart: regular rate and rhythm, S1, S2 normal, no murmur, click, rub or gallop Lungs: diminished breath sounds bibasilar Abdomen: soft, non-tender; bowel sounds normal; no masses,  no organomegaly Extremities: extremities normal, atraumatic, no cyanosis or edema and Homans sign is negative, no sign of DVT Wound: Dressing intact sternum stable  Lab Results: CBC: Recent Labs    11/11/18 1700 11/12/18 0325  WBC 19.5* 13.4*  HGB 13.2 11.5*  HCT 39.4 35.4*  PLT 133* 112*   BMET:  Recent Labs  11/11/18 1700 11/12/18 0325  NA 137 136  K 4.9 3.8  CL 107 104  CO2 21* 24  GLUCOSE 319* 231*  BUN 14 18  CREATININE 1.41* 1.57*  CALCIUM 7.7* 7.9*    CMET: Lab Results  Component Value Date   WBC 13.4 (H) 11/12/2018   HGB 11.5 (L) 11/12/2018   HCT 35.4 (L) 11/12/2018   PLT 112 (L) 11/12/2018   GLUCOSE 231 (H) 11/12/2018   CHOL 228 (H) 11/05/2018   TRIG 587 (HH) 11/05/2018   HDL 33 (L) 11/05/2018   LDLCALC Comment 11/05/2018   ALT 44 11/09/2018   AST 40 11/09/2018   NA 136 11/12/2018   K 3.8 11/12/2018   CL 104 11/12/2018   CREATININE 1.57 (H) 11/12/2018   BUN 18 11/12/2018   CO2 24  11/12/2018   INR 1.2 11/10/2018   HGBA1C 11.8 (H) 11/10/2018      PT/INR:  Recent Labs    11/10/18 1405  LABPROT 15.4*  INR 1.2   Radiology: No results found. COOX 73.8  Assessment/Plan: S/P Procedure(s) (LRB): CORONARY ARTERY BYPASS GRAFTING (CABG) x Two , using left internal mammary artery and right leg greater saphenous vein harvested endoscopically - SVG to Ramus, LIMA to LAD (N/A) TRANSESOPHAGEAL ECHOCARDIOGRAM (TEE) (N/A) Mobilize Diuresis Diabetes control Patient back on insulin drip secondary to high glucoses up to 300-increase Levemir dosage and administer twice a day Monitor urine output, and creatinine level as patient at high risk for renal injury Wean off milrinone    Delight Ovens 11/12/2018 6:02 AM

## 2018-11-12 NOTE — Telephone Encounter (Signed)
PATIENT HAD CABG SURGERY 11/10/2018

## 2018-11-12 NOTE — Discharge Summary (Addendum)
Physician Discharge Summary       [address] [place]       [name] [address]             [phone]    Patient ID: Eric [name] MRN: [id number] DOB/AGE: 1961/06/27 58 y.o.  Admit date: 11/09/2018 Discharge date: 11/23/2018  Admission Diagnoses: 1. Angina pectoris (HCC) 2.  Coronary artery disease  Discharge Diagnoses:  1. S/p CABG x 2 2. ABL anemia 3. Post op atrial fibrillation 4. History of OSA 5. History of remote tobacco abuse 6. History of poorly controlled diabetes mellitus 7. History of polyneuropathy 8. History of hyperlipidemia 9. History of obesity 10. History of GERD 11. History of stroke (HCC) 12. History of fatty liver  Procedure (s):  LEFT HEART CATH AND CORONARY ANGIOGRAPHY by Dr. Tresa Endo 11/09/2018:  Conclusion     Ost Ramus to Ramus lesion is 70% stenosed.  Dist LM to Ost LAD lesion is 85% stenosed.   Significant ostial 85% LAD stenosis at a sharp angle arising from the left main with 70% proximal optional diagonal stenosis; normal left circumflex and normal large dominant RCA.  Low normal global LV function with mild anterolateral hypocontractility with an EF of approximately 50 to 55%.  LVEDP 8 mmHg.  RECOMMENDATION: Surgical consult for CABG revascularization surgery.  Will change lovastatin to atorvastatin 80 mg.     Coronary artery bypass grafting x2 with the left internal mammary to the left anterior descending coronary artery, reverse saphenous vein graft to the intermediate coronary artery with right thigh greater saphenous endoscopic vein  Harvesting by Dr. Tyrone Sage on 11/10/2018.  History of Presenting Illness: This is a 59 year old male with a past medical history of type II diabetes mellitus (poorly controlled), obesity, polyneuropathy, hyperlipidemia, TIA , fatty liver, remote tobacco abuse, and GERD who initially presented with sharp, sub sternal chest pain accompanied by nausea and diaphoresis 05/17/2018. Apparently,  his work up revealed he was low risk and he was discharged. According to medical records: he was a low risk for short-term major cardiovascular event, but could well have severe underlying CAD in the setting of poorly controlled long-standing diabetes mellitus and severe mixed hyperlipidemia. Dr. Royann Shivers thought the next best step in evaluation would be a coronary CT angiogram. A nuclear perfusion study would be a second, less informative option, but he had already eaten. Per Dr. Royann Shivers, it was ok for an outpatient work-up, which he prefers to be performed at [facility]. So he had a nuclear stress test at [facility] in October, with apparent normal results.   His polyneuropathy (on Gabapentin 800 mg tid) has limited his exercise ability in the past. He continued to have chest pain (began occurring at rest as well) and complaints of exertional shortness of breath (worse recently with shorter distances) . HE went back to Dr. Royann Shivers who has arranged further work up.  EKG done today showed sinus rhythm. Cardiac CT done yesterday showed coronary calcium score of 34 (this was 60 percentile for age and sex matched control), severe predominantly noncalcified, high risk plaque with stenosis > 70-% is seen in the proximal LAD. Moderate plaque in the ostial LCX and D1 arteries. He then underwent a cardiac catheterization done today which showed LVEF 50-55% and significant ostial 85% LAD stenosis at a sharp angle arising from the left main with 70% proximal optional diagonal stenosis; normal left circumflex and normal large dominant RCA. Of note, echo done 10/14/2018 showed LVEF 60-65%, no significant valvular disease,  and there is mild dilatation of the ascending aorta measuring 39 mm.  I have  has been consulted for consideration of coronary artery bypass grafting surgery. Currently, vital signs are stable and he denies chest pain.   Patient has documented HgA1c over past 3-4 years ranging from 7 to 52 ,  frequently has glucose over 300   Dr. Tyrone Sage discussed the need for coronary artery bypass grafting surgery. Potential risks, benefits, and complications of the surgery were discussed with the patient and he agreed to proceed. Pre operative carotid duplex US showed no significant internal carotid artery stenosis bilaterally. He underwent a CABG x 2 on 03/11.   Brief Hospital Course:  The patient was extubated the morning of post operative day one without difficulty. He remained afebrile and hemodynamically stable. He was weaned off Neo Synephrine, Dopamine, and Milrinone drips. Theone Murdoch, a line, chest tubes, and foley were removed early in the post operative course. Lopressor was started and titrated accordingly. He was volume over loaded and diuresed.He has a history of OSA and used CPAP at night. He had ABL anemia. He did not require a post op transfusion. Last H and H was stable at 11.3 and 34.5. He was weaned off the insulin drip.   The patient's glucose remained difficult to control.The patient's HGA1C pre op was  11.8. He went into atrial fibrillation with RVR on 03/13 and was put on Amiodarone. He converted to sinus rhythm and remained in sinus rhythm. The patient was felt surgically stable for transfer from the ICU to PCTU for further convalescence on 03/16. He continues to progress with cardiac rehab. He was ambulating on room air. He has been tolerating a diet and has had a bowel movement. He is still volume overloaded and will require further diuresis. Epicardial pacing wires were removed on 03/17. Chest tube sutures were removed on 03/18. He then had decreased oxygenation on 03/18. He was put back on 2 liters via [place]. PA/LAT CXR was taken on 03/19 and showed little interval change in size of small bilateral pleural effusions L > R.   As discussed with Dr. Tyrone Sage, the patient is felt surgically stable for discharge today.   Latest Vital Signs: Blood pressure 116/79, pulse 66, temperature (!)  97.5 F (36.4 C), temperature source Oral, resp. rate 18, height  (1.676 m), weight 94.6 kg, SpO2 92 %.  Physical Exam: Cardiovascular: RRR Pulmonary: Slightly diminished at right base Abdomen: Soft, non tender, bowel sounds present. Extremities: Mild bilateral lower extremity edema. Wounds: Clean and dry.  No erythema or signs of infection.  Discharge Condition: Stable and discharged to home.  Recent laboratory studies:  Lab Results  Component Value Date   WBC 10.7 (H) 11/16/2018   HGB 11.3 (L) 11/16/2018   HCT 34.5 (L) 11/16/2018   MCV 89.4 11/16/2018   PLT 191 11/16/2018   Lab Results  Component Value Date   NA 134 (L) 11/16/2018   K 3.8 11/16/2018   CL 97 (L) 11/16/2018   CO2 27 11/16/2018   CREATININE 1.23 11/16/2018   GLUCOSE 142 (H) 11/16/2018    Diagnostic Studies: Dg Chest 2 View  Result Date: 11/16/2018 CLINICAL DATA:  Sore chest. EXAM: CHEST - 2 VIEW COMPARISON:  11/15/2018. FINDINGS: Right IJ sheath has been removed. Prior median sternotomy and CABG. Cardiomegaly with normal pulmonary vascularity. Mild bibasilar atelectasis/infiltrates. Small bilateral pleural effusions. IMPRESSION: 1.  Right IJ sheath has been removed. 2.  Prior CABG.  Cardiomegaly with normal pulmonary vascularity.  3. Mild bibasilar atelectasis/infiltrates. Small bilateral pleural effusions. Findings stable from prior exam. Electronically Signed   By: Maisie Fus  Register   On: 11/16/2018 09:41   Ct Coronary Morph W/cta Cor Teressa Senter W/ca W/cm &/or Wo/cm  Addendum Date: 11/05/2018   ADDENDUM REPORT: 11/05/2018 13:55 CLINICAL DATA:  58 year old male with hypertension, hyperlipidemia and chest pain. EXAM: Cardiac/Coronary  CT TECHNIQUE: The patient was scanned on a [employer]. FINDINGS: A 120 kV prospective scan was triggered in the descending thoracic aorta at 111 HU's. Axial non-contrast 3 mm slices were carried out through the heart. The data set was analyzed on a dedicated work station  and scored using the Agatson method. Gantry rotation speed was 250 msecs and collimation was .6 mm. No beta blockade and 0.8 mg of sl NTG was given. The 3D data set was reconstructed in 5% intervals of the 67-82 % of the R-R cycle. Diastolic phases were analyzed on a dedicated work station using MPR, MIP and VRT modes. The patient received 80 cc of contrast. Aorta:  Normal size.  No calcifications.  No dissection. Aortic Valve:  Trileaflet.  Mild leaflet calcifications. Coronary Arteries:  Normal coronary origin.  Right dominance. RCA is a large dominant artery that gives rise to PDA and PLA. There is mild calcified plaque in the proximal RCA with associated stenosis 25-50%. Left main is a large artery that gives rise to LAD and LCX arteries. Distal left main has mild calcified plaque with stenosis 25-50%. LAD is a large vessel that gives rise to one diagonal artery. There is severe mixed, predominantly non-calcified ostial plaque with high risk features and stenosis > 70%. Mid and distal LAD has mild diffuse plaque. D1 has a moderate non-calcified proximal plaque with stenosis 50-69%. LCX is a non-dominant artery that gives rise to one small OM1 branch. There is moderate noncalcified ostial plaque with stenosis 50-69% but stenosis > 70% cant be excluded. Other findings: Normal pulmonary vein drainage into the left atrium. Normal let atrial appendage without a thrombus. Normal size of the pulmonary artery. IMPRESSION: 1. Coronary calcium score of 34. This was 60 percentile for age and sex matched control. 2. Normal coronary origin with right dominance. 3. Severe predominantly noncalcified, high risk plaque with stenosis > 70-% is seen in the proximal LAD. Moderate plaque in the ostial LCX and D1 arteries. A cardiac catheterization is recommended. Electronically Signed   By: Tobias Alexander   On: 11/05/2018 13:55   Result Date: 11/05/2018 EXAM: OVER-READ INTERPRETATION  CT CHEST The following report is an over-read  performed by radiologist Dr. Charlett Nose of [facility] on 11/04/2018. This over-read does not include interpretation of cardiac or coronary anatomy or pathology. The coronary CTA interpretation by the cardiologist is attached. COMPARISON:  None. FINDINGS: Vascular: Heart is normal size.  Visualized aorta normal caliber. Mediastinum/Nodes: No adenopathy the in the lower mediastinum or hila. Lungs/Pleura: Visualized lungs clear.  No effusions. Upper Abdomen: Imaging into the upper abdomen shows no acute findings. Musculoskeletal: Chest wall soft tissues are unremarkable. No acute bony abnormality. IMPRESSION: No acute or significant extracardiac abnormality. Electronically Signed: By: Charlett Nose M.D. On: 11/04/2018 15:55    Ct Coronary Fractional Flow Reserve Data Prep  Result Date: 11/06/2018 EXAM: CT FFR ANALYSIS CLINICAL DATA:  58 year old male with chest pain and abnormal coronary CTA. FINDINGS: FFRct analysis was performed on the original cardiac CT angiogram dataset. Diagrammatic representation of the FFRct analysis is provided in a separate PDF document in PACS.  This dictation was created using the PDF document and an interactive 3D model of the results. 3D model is not available in the EMR/PACS. Normal FFR range is >0.80. 1. Left Main:  No significant stenosis. 2. LAD: Proximal FFR: 0.75. 3. LCX: No significant stenosis. 4. RCA: No significant stenosis. IMPRESSION: 1. CT FFR analysis showed severe stenosis in the proximal LAD. A cardiac catheterization is recommended. Electronically Signed   By: Tobias Alexander   On: 11/06/2018 13:10   Vas US Doppler Pre Cabg  Result Date: 11/09/2018 PREOPERATIVE VASCULAR EVALUATION  Indications:      Pre CABG evaluation. Risk Factors:     Hyperlipidemia, Diabetes. Other Factors:    History of stroke. Limitations:      Body habitus and TR band on site. Comparison Study: No comparison study available Performing Technologist: Melodie Bouillon RDMS, RVT   Examination Guidelines: A complete evaluation includes B-mode imaging, spectral Doppler, color Doppler, and power Doppler as needed of all accessible portions of each vessel. Bilateral testing is considered an integral part of a complete examination. Limited examinations for reoccurring indications may be performed as noted.  Right Carotid Findings: +----------+--------+--------+--------+-----------------------+--------+           PSV cm/sEDV cm/sStenosisDescribe               Comments +----------+--------+--------+--------+-----------------------+--------+ CCA Prox  73      26              hypoechoic                      +----------+--------+--------+--------+-----------------------+--------+ CCA Distal85      29              hyperechoic                     +----------+--------+--------+--------+-----------------------+--------+ ICA Prox  53      23      1-39%   hyperechoic and diffuse         +----------+--------+--------+--------+-----------------------+--------+ ICA Mid   71      35                                              +----------+--------+--------+--------+-----------------------+--------+ ICA Distal80      40                                              +----------+--------+--------+--------+-----------------------+--------+ ECA       81      21                                              +----------+--------+--------+--------+-----------------------+--------+ Portions of this table do not appear on this page. +----------+--------+-------+--------+------------+           PSV cm/sEDV cmsDescribeArm Pressure +----------+--------+-------+--------+------------+ Subclavian63                                  +----------+--------+-------+--------+------------+ +---------+--------+--+--------+--+---------+ VertebralPSV cm/s40EDV cm/s19Antegrade +---------+--------+--+--------+--+---------+ Left Carotid Findings:  +----------+--------+-------+--------+----------------------+------------------+           PSV cm/sEDV  StenosisDescribe              Comments                             cm/s                                                    +----------+--------+-------+--------+----------------------+------------------+ CCA Prox  97      34                                                      +----------+--------+-------+--------+----------------------+------------------+ CCA Distal74      30             diffuse and           intimal thickening                                  hyperechoic                              +----------+--------+-------+--------+----------------------+------------------+ ICA Prox  87      34     1-39%   hyperechoic and focal                    +----------+--------+-------+--------+----------------------+------------------+ ICA Mid   84      38                                                      +----------+--------+-------+--------+----------------------+------------------+ ICA Distal67      28                                                      +----------+--------+-------+--------+----------------------+------------------+ ECA       123     20                                                      +----------+--------+-------+--------+----------------------+------------------+ +----------+--------+--------+--------+------------+ SubclavianPSV cm/sEDV cm/sDescribeArm Pressure +----------+--------+--------+--------+------------+           123                     106          +----------+--------+--------+--------+------------+ +---------+--------+--+--------+--+---------+ VertebralPSV cm/s34EDV cm/s15Antegrade +---------+--------+--+--------+--+---------+  ABI Findings: +--------+------------------+-----+---------+---------------+ Right   Rt Pressure (mmHg)IndexWaveform Comment          +--------+------------------+-----+---------+---------------+ Brachial                       triphasicTR band on site +--------+------------------+-----+---------+---------------+ PTA     135  1.27 triphasic                +--------+------------------+-----+---------+---------------+ DP      118               1.11 triphasic                +--------+------------------+-----+---------+---------------+ +--------+------------------+-----+---------+-------+ Left    Lt Pressure (mmHg)IndexWaveform Comment +--------+------------------+-----+---------+-------+ [id number]                    triphasic        +--------+------------------+-----+---------+-------+ PTA     108               1.02 triphasic        +--------+------------------+-----+---------+-------+ DP      115               1.08 triphasic        +--------+------------------+-----+---------+-------+ +-------+---------------+----------------+ ABI/TBIToday's ABI/TBIPrevious ABI/TBI +-------+---------------+----------------+ Right  1.27                            +-------+---------------+----------------+ Left   1.08                            +-------+---------------+----------------+  Right Doppler Findings: +--------+--------+-----+---------+---------------+ Site    PressureIndexDoppler  Comments        +--------+--------+-----+---------+---------------+ Brachial             triphasicTR band on site +--------+--------+-----+---------+---------------+ Radial               triphasic                +--------+--------+-----+---------+---------------+ Ulnar                triphasic                +--------+--------+-----+---------+---------------+  Left Doppler Findings: +--------+--------+-----+---------+--------+ Site    PressureIndexDoppler  Comments +--------+--------+-----+---------+--------+ [id number]          triphasic          +--------+--------+-----+---------+--------+ Radial               triphasic         +--------+--------+-----+---------+--------+ Ulnar                triphasic         +--------+--------+-----+---------+--------+  Summary: Right Carotid: Velocities in the right ICA are consistent with a 1-39% stenosis. Left Carotid: Velocities in the left ICA are consistent with a 1-39% stenosis. Vertebrals: Bilateral vertebral arteries demonstrate antegrade flow. Right ABI: Resting right ankle-brachial index is within normal range. No evidence of significant right lower extremity arterial disease. Left ABI: Resting left ankle-brachial index is within normal range. No evidence of significant left lower extremity arterial disease. Right Upper Extremity: Doppler waveforms remain within normal limits with right radial compression. Doppler waveforms remain within normal limits with right ulnar compression. Left Upper Extremity: Doppler waveforms remain within normal limits with left radial compression. Doppler waveforms decrease >50% with left ulnar compression.   Electronically signed by Waverly Ferrari MD on 11/09/2018 at 8:34:22 PM.    Final        Discharge Instructions    Amb Referral to Cardiac Rehabilitation   Complete by:  As directed    Referring to [facility] CRP 2   Diagnosis:  CABG   CABG X ___:  2   Discharge patient   Complete by:  As directed    Discharge disposition:  01-Home or Self Care   Discharge patient date:  11/19/2018      Discharge Medications: Allergies as of 11/19/2018      Reactions   Bee Venom Swelling   Lisinopril Nausea And Vomiting, Cough   Coughing, sweating, sneezing      Medication List    STOP taking these medications   BILBERRY PO   HYDROcodone-acetaminophen 5-325 MG tablet Commonly known as:  NORCO/VICODIN   lovastatin 40 MG tablet Commonly known as:  MEVACOR     TAKE these medications   Accu-Chek Aviva Plus test strip Generic drug:  glucose blood    Adjustable Lancing Device Misc   Adjustable Lancing Device Misc   albuterol 108 (90 Base) MCG/ACT inhaler Commonly known as:  PROVENTIL HFA;VENTOLIN HFA Inhale 2 puffs into the lungs every 6 (six) hours as needed for wheezing or shortness of breath.   allopurinol 300 MG tablet Commonly known as:  ZYLOPRIM Take 300 mg by mouth daily.   amiodarone 200 MG tablet Commonly known as:  PACERONE Take 1 tablet (200 mg total) by mouth 2 (two) times daily. For 2 weeks then take 200 mg daily thereafter   aspirin EC 325 MG tablet Take 1 tablet (325 mg total) by mouth daily. What changed:    medication strength  how much to take  Another medication with the same name was removed. Continue taking this medication, and follow the directions you see here.   atorvastatin 80 MG tablet Commonly known as:  LIPITOR Take 1 tablet (80 mg total) by mouth daily at 6 PM.   cyclobenzaprine 10 MG tablet Commonly known as:  FLEXERIL Take 10 mg by mouth 2 (two) times daily as needed for muscle spasms.   diphenhydrAMINE 25 MG tablet Commonly known as:  BENADRYL Take 25 mg by mouth daily as needed for itching.   fluticasone 50 MCG/ACT nasal spray Commonly known as:  FLONASE Place 1 spray into both nostrils daily as needed for allergies or rhinitis.   furosemide 40 MG tablet Commonly known as:  Lasix Take 1 tablet (40 mg total) by mouth daily. For 5 days then stop.   gabapentin 400 MG capsule Commonly known as:  NEURONTIN Take 800 mg by mouth 3 (three) times daily.   glipiZIDE 5 MG tablet Commonly known as:  GLUCOTROL Take 5 mg by mouth daily.   guaiFENesin 600 MG 12 hr tablet Commonly known as:  MUCINEX Take 1 tablet (600 mg total) by mouth 2 (two) times daily as needed for cough.   insulin lispro protamine-lispro (75-25) 100 UNIT/ML Susp injection Commonly known as:  HUMALOG 75/25 MIX Inject 20-30 Units into the skin See admin instructions. Inject 30 units in am and 20 units bedtime.    metFORMIN 1000 MG tablet Commonly known as:  GLUCOPHAGE Take 1,000 mg by mouth 2 (two) times daily with a meal.   metoprolol tartrate 25 MG tablet Commonly known as:  LOPRESSOR Take 0.5 tablets (12.5 mg total) by mouth 2 (two) times daily. Take 2 tablet (100 mg) by mouth once for procedure. What changed:    medication strength  how much to take  how to take this  when to take this   multivitamin with minerals tablet Take 1 tablet by mouth 2 (two) times a week.   nicotine 14 mg/24hr patch Commonly known as:  NICODERM CQ - dosed in mg/24 hours Place 1 patch (14 mg total) onto the skin daily.  omeprazole 20 MG capsule Commonly known as:  PRILOSEC Take 40 mg by mouth daily.   oxyCODONE 5 MG immediate release tablet Commonly known as:  Oxy IR/ROXICODONE Take 1 tablet (5 mg total) by mouth every 6 (six) hours as needed for severe pain.   potassium chloride SA 20 MEQ tablet Commonly known as:  K-DUR,KLOR-CON Take 1 tablet (20 mEq total) by mouth daily. For 5 days then stop.   saw palmetto 160 MG capsule Take 160 mg by mouth daily.      The patient has been discharged on:   1.Beta Blocker:  Yes [ x  ]                              No   [   ]                              If No, reason:  2.Ace Inhibitor/ARB: Yes [   ]                                     No  [  x  ]                                     If No, reason:Labile BP  3.Statin:   Yes [  x ]                  No  [   ]                  If No, reason:  4.Ecasa:  Yes  [ x  ]                  No   [   ]                  If No, reason:  Follow Up Appointments: Follow-up Information    Delight Ovens, MD. Go on 12/20/2018.   Specialty:  Cardiothoracic Surgery Why:  PA/LAT CXR to be taken (at [facility] which is in the same building as Dr. Dennie Maizes office) on 04/20 at 12:30 pm;Appointment time is at 1:00 pm Contact information: [address] [employer] [address] [place] [address] [phone]         Marlowe-Rogers, Hidi, MD. Call.   Specialty:  Family Medicine Why:  for a follow up appointment regarding further diabetes management and surveillance of HGA1C 11.8 Contact information: [address] [place] [address] [phone]        Jodelle Gross, NP. Go on 11/30/2018.   Specialties:  Nurse Practitioner, Radiology, Cardiology Why:  Appointment time is at 8:30 am Contact information: [address] [place] [address] [phone]        Care, [facility] Follow up.   Specialty:  Home Health Services Why:  Home Health Registered Nurse, Physical and Occupational Therapy Contact information: [address] [place] [address] [phone]           Signed: Bernadette Hoit ContePA-C 11/23/2018, 9:48 AM

## 2018-11-12 NOTE — Progress Notes (Signed)
      [address]       [place] [address]             [phone]      POD # 2 CABG  CBG elevated BP 95/71   Pulse 93   Temp 98.3 F (36.8 C) (Oral)   Resp 15   Ht 5\' 6"  (1.676 m)   Wt 93.1 kg   SpO2 93%   BMI 33.13 kg/m   Intake/Output Summary (Last 24 hours) at 11/12/2018 1821 Last data filed at 11/12/2018 1400 Gross per 24 hour  Intake 1460.44 ml  Output 1025 ml  Net 435.44 ml   Dopamine @ 2.5 mcg/kg/min Off milrinone  CBG upt o 323 since insulin drip stopped Will increase levemir, add meal coverage, restart glipizide, continue SSI  Missi Mcmackin C. Dorris Fetch, MD [facility] [phone]

## 2018-11-12 NOTE — Evaluation (Signed)
Physical Therapy Evaluation Patient Details Name: Eric [name] MRN: [id number] DOB: 1961-05-29 Today's Date: 11/12/2018   History of Present Illness  Pt adm with chest pain. 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 PMH - uncontrolled DM, neuropathy, TIA, fatty liver  Clinical Impression  Pt presents to PT with decr mobility after CABG. Pt requires much verbal encouragement to mobilize. Pt also resistant to instruction or teaching from myself or his wife. Pt should progress with mobility and be able to return home with wife. However if pt remains difficult to educate dc recommendations may change.     Follow Up Recommendations Home health PT;Supervision/Assistance - 24 hour    Equipment Recommendations  Other (comment)(rolling walker vs rollator)    Recommendations for Other Services OT consult     Precautions / Restrictions Precautions Precautions: Fall;Sternal      Mobility  Bed Mobility               General bed mobility comments: Pt up in chair  Transfers Overall transfer level: Needs assistance Equipment used: 4-wheeled walker Transfers: Sit to/from Stand Sit to Stand: Min assist         General transfer comment: Assist to bring hips up and for balance. Verbal cues for hand placement and sternal precautions  Ambulation/Gait Ambulation/Gait assistance: Mod assist Gait Distance (Feet): 290 Feet Assistive device: 4-wheeled walker Gait Pattern/deviations: Step-through pattern;Decreased stride length;Trunk flexed Gait velocity: Below normal but too fast for pt's poor balance   General Gait Details: Assist for balance and support. Repeated verbal cues to stand more erect. Pt with with tendency to lean anteriorly and not have good control of rollator.   Stairs            Wheelchair Mobility    Modified Rankin (Stroke Patients Only)       Balance Overall balance assessment: Needs assistance Sitting-balance support: Feet supported;Bilateral upper extremity  supported Sitting balance-Leahy Scale: Poor Sitting balance - Comments: UE support   Standing balance support: Bilateral upper extremity supported Standing balance-Leahy Scale: Poor Standing balance comment: rollator and min assist for static standing                             Pertinent Vitals/Pain Pain Assessment: Faces Faces Pain Scale: Hurts little more Pain Location: chest and legs Pain Descriptors / Indicators: Burning;Operative site guarding Pain Intervention(s): Limited activity within patient's tolerance;Monitored during session;Premedicated before session;Repositioned    Home Living Family/patient expects to be discharged to:: Private residence Living Arrangements: Spouse/significant other Available Help at Discharge: Family Type of Home: Mobile home Home Access: Stairs to enter Entrance Stairs-Rails: Right Entrance Stairs-Number of Steps: 4 Home Layout: One level Home Equipment: Cane - single point      Prior Function Level of Independence: Independent         Comments: Sedentary     Hand Dominance        Extremity/Trunk Assessment   Upper Extremity Assessment Upper Extremity Assessment: Defer to OT evaluation    Lower Extremity Assessment Lower Extremity Assessment: RLE deficits/detail;LLE deficits/detail;Generalized weakness RLE Sensation: history of peripheral neuropathy LLE Sensation: history of peripheral neuropathy       Communication   Communication: No difficulties  Cognition Arousal/Alertness: Awake/alert Behavior During Therapy: WFL for tasks assessed/performed Overall Cognitive Status: History of cognitive impairments - at baseline  General Comments: wife reports noting soming baseline memory problems. Pt resistant to education      General Comments      Exercises     Assessment/Plan    PT Assessment Patient needs continued PT services  PT Problem List Decreased  strength;Decreased activity tolerance;Decreased balance;Decreased mobility;Decreased knowledge of use of DME;Decreased knowledge of precautions       PT Treatment Interventions DME instruction;Gait training;Stair training;Functional mobility training;Therapeutic activities;Therapeutic exercise;Balance training;Patient/family education    PT Goals (Current goals can be found in the Care Plan section)  Acute Rehab PT Goals Patient Stated Goal: Drive and watch TV PT Goal Formulation: With patient/family Time For Goal Achievement: 11/26/18 Potential to Achieve Goals: Fair    Frequency Min 3X/week   Barriers to discharge Inaccessible home environment stairs to enter    Co-evaluation               AM-PAC PT "6 Clicks" Mobility  Outcome Measure Help needed turning from your back to your side while in a flat bed without using bedrails?: A Lot Help needed moving from lying on your back to sitting on the side of a flat bed without using bedrails?: A Lot Help needed moving to and from a bed to a chair (including a wheelchair)?: A Little Help needed standing up from a chair using your arms (e.g., wheelchair or bedside chair)?: A Little Help needed to walk in hospital room?: A Lot Help needed climbing 3-5 steps with a railing? : A Lot 6 Click Score: 14    End of Session Equipment Utilized During Treatment: Gait belt;Oxygen Activity Tolerance: Patient limited by fatigue Patient left: in chair;with call bell/phone within reach;with family/visitor present Nurse Communication: Mobility status PT Visit Diagnosis: Unsteadiness on feet (R26.81);Other abnormalities of gait and mobility (R26.89);Muscle weakness (generalized) (M62.81)    Time: 9390-3009 PT Time Calculation (min) (ACUTE ONLY): 31 min   Charges:   PT Evaluation $PT Eval Moderate Complexity: 1 Mod PT Treatments $Gait Training: 8-22 mins        [facility] PT Acute Rehabilitation Services Pager [phone] Office  [phone]   [facility] 11/12/2018, 2:28 PM

## 2018-11-13 ENCOUNTER — Inpatient Hospital Stay (HOSPITAL_COMMUNITY): Payer: Medicare Other

## 2018-11-13 DIAGNOSIS — I4891 Unspecified atrial fibrillation: Secondary | ICD-10-CM

## 2018-11-13 LAB — BASIC METABOLIC PANEL
Anion gap: 9 (ref 5–15)
BUN: 24 mg/dL — ABNORMAL HIGH (ref 6–20)
CO2: 25 mmol/L (ref 22–32)
Calcium: 8 mg/dL — ABNORMAL LOW (ref 8.9–10.3)
Chloride: 99 mmol/L (ref 98–111)
Creatinine, Ser: 1.35 mg/dL — ABNORMAL HIGH (ref 0.61–1.24)
GFR calc Af Amer: >60 mL/min (ref >60)
GFR calc non Af Amer: 58 mL/min — ABNORMAL LOW (ref >60)
Glucose, Bld: 315 mg/dL — ABNORMAL HIGH (ref 70–99)
Potassium: 4.2 mmol/L (ref 3.5–5.1)
Sodium: 133 mmol/L — ABNORMAL LOW (ref 135–145)

## 2018-11-13 LAB — CBC
HCT: 35.3 % — ABNORMAL LOW (ref 39.0–52.0)
Hemoglobin: 11.4 g/dL — ABNORMAL LOW (ref 13.0–17.0)
MCH: 29 pg (ref 26.0–34.0)
MCHC: 32.3 g/dL (ref 30.0–36.0)
MCV: 89.8 fL (ref 80.0–100.0)
Platelets: 126 10*3/uL — ABNORMAL LOW (ref 150–400)
RBC: 3.93 MIL/uL — ABNORMAL LOW (ref 4.22–5.81)
RDW: 12.8 % (ref 11.5–15.5)
WBC: 12.6 10*3/uL — ABNORMAL HIGH (ref 4.0–10.5)
nRBC: 0 % (ref 0.0–0.2)

## 2018-11-13 LAB — COOXEMETRY PANEL
Carboxyhemoglobin: 1.2 % (ref 0.5–1.5)
Methemoglobin: 1.6 % — ABNORMAL HIGH (ref 0.0–1.5)
O2 Saturation: 58.8 %
Total hemoglobin: 11.5 g/dL — ABNORMAL LOW (ref 12.0–16.0)

## 2018-11-13 LAB — GLUCOSE, CAPILLARY
Glucose-Capillary: 132 mg/dL — ABNORMAL HIGH (ref 70–99)
Glucose-Capillary: 158 mg/dL — ABNORMAL HIGH (ref 70–99)
Glucose-Capillary: 172 mg/dL — ABNORMAL HIGH (ref 70–99)
Glucose-Capillary: 236 mg/dL — ABNORMAL HIGH (ref 70–99)
Glucose-Capillary: 283 mg/dL — ABNORMAL HIGH (ref 70–99)
Glucose-Capillary: 290 mg/dL — ABNORMAL HIGH (ref 70–99)

## 2018-11-13 MED ORDER — METOPROLOL TARTRATE 25 MG PO TABS
25.0000 mg | ORAL_TABLET | Freq: Two times a day (BID) | ORAL | Status: DC
  Administered 2018-11-13: 25 mg via ORAL
  Filled 2018-11-13: qty 1

## 2018-11-13 MED ORDER — INSULIN ASPART 100 UNIT/ML ~~LOC~~ SOLN
0.0000 [IU] | Freq: Three times a day (TID) | SUBCUTANEOUS | Status: DC
  Administered 2018-11-13: 7 [IU] via SUBCUTANEOUS
  Administered 2018-11-13: 11 [IU] via SUBCUTANEOUS
  Administered 2018-11-13 – 2018-11-15 (×3): 4 [IU] via SUBCUTANEOUS
  Administered 2018-11-16: 3 [IU] via SUBCUTANEOUS
  Administered 2018-11-16 – 2018-11-17 (×3): 4 [IU] via SUBCUTANEOUS
  Administered 2018-11-19: 3 [IU] via SUBCUTANEOUS

## 2018-11-13 MED ORDER — INSULIN ASPART 100 UNIT/ML ~~LOC~~ SOLN: 0.0000 [IU] | Freq: Every day | SUBCUTANEOUS | Status: DC

## 2018-11-13 MED ORDER — POTASSIUM CHLORIDE CRYS ER 10 MEQ PO TBCR
20.0000 meq | EXTENDED_RELEASE_TABLET | Freq: Every day | ORAL | Status: DC
  Administered 2018-11-13: 20 meq via ORAL
  Filled 2018-11-13 (×2): qty 2

## 2018-11-13 MED ORDER — ENOXAPARIN SODIUM 40 MG/0.4ML ~~LOC~~ SOLN
40.0000 mg | SUBCUTANEOUS | Status: DC
  Administered 2018-11-13 – 2018-11-19 (×7): 40 mg via SUBCUTANEOUS
  Filled 2018-11-13 (×7): qty 0.4

## 2018-11-13 MED ORDER — INSULIN DETEMIR 100 UNIT/ML ~~LOC~~ SOLN
40.0000 [IU] | Freq: Two times a day (BID) | SUBCUTANEOUS | Status: DC
  Administered 2018-11-13 (×2): 40 [IU] via SUBCUTANEOUS
  Filled 2018-11-13 (×3): qty 0.4

## 2018-11-13 MED ORDER — FUROSEMIDE 40 MG PO TABS
40.0000 mg | ORAL_TABLET | Freq: Every day | ORAL | Status: DC
  Administered 2018-11-13: 40 mg via ORAL
  Filled 2018-11-13: qty 1

## 2018-11-13 MED ORDER — METOPROLOL TARTRATE 12.5 MG HALF TABLET
12.5000 mg | ORAL_TABLET | Freq: Two times a day (BID) | ORAL | Status: DC
  Administered 2018-11-14 – 2018-11-15 (×3): 12.5 mg via ORAL
  Filled 2018-11-13 (×4): qty 1

## 2018-11-13 NOTE — Progress Notes (Signed)
3 Days Post-Op Procedure(s) (LRB): CORONARY ARTERY BYPASS GRAFTING (CABG) x Two , using left internal mammary artery and right leg greater saphenous vein harvested endoscopically - SVG to Ramus, LIMA to LAD (N/A) TRANSESOPHAGEAL ECHOCARDIOGRAM (TEE) (N/A) Subjective: "hurts when I move", denies nausea  Objective: Vital signs in last 24 hours: Temp:  [97.8 F (36.6 C)-99.9 F (37.7 C)] 98.4 F (36.9 C) (03/14 0735) Pulse Rate:  [80-131] 84 (03/14 0700) Cardiac Rhythm: Normal sinus rhythm (03/14 0400) Resp:  [5-26] 16 (03/14 0700) BP: (76-170)/(56-105) 150/97 (03/14 0700) SpO2:  [89 %-100 %] 93 % (03/14 0700) Weight:  [94.8 kg] 94.8 kg (03/14 0600)  Hemodynamic parameters for last 24 hours:    Intake/Output from previous day: 03/13 0701 - 03/14 0700 In: 1576.9 [P.O.:960; I.V.:616.9] Out: 1880 [Urine:1880] Intake/Output this shift: No intake/output data recorded.  General appearance: alert, cooperative and no distress Neurologic: intact Heart: irregularly irregular rhythm Lungs: diminished breath sounds bibasilar Abdomen: mildly distended, + BS  Lab Results: Recent Labs    11/12/18 0325 11/13/18 0500  WBC 13.4* 12.6*  HGB 11.5* 11.4*  HCT 35.4* 35.3*  PLT 112* 126*   BMET:  Recent Labs    11/12/18 0325 11/13/18 0500  NA 136 133*  K 3.8 4.2  CL 104 99  CO2 24 25  GLUCOSE 231* 315*  BUN 18 24*  CREATININE 1.57* 1.35*  CALCIUM 7.9* 8.0*    PT/INR:  Recent Labs    11/10/18 1405  LABPROT 15.4*  INR 1.2   ABG    Component Value Date/Time   PHART 7.340 (L) 11/11/2018 0636   HCO3 22.5 11/11/2018 0636   TCO2 24 11/11/2018 0636   ACIDBASEDEF 3.0 (H) 11/11/2018 0636   O2SAT 58.8 11/13/2018 0507   CBG (last 3)  Recent Labs    11/12/18 2324 11/13/18 0340 11/13/18 0741  GLUCAP 333* 283* 290*    Assessment/Plan: S/P Procedure(s) (LRB): CORONARY ARTERY BYPASS GRAFTING (CABG) x Two , using left internal mammary artery and right leg greater saphenous  vein harvested endoscopically - SVG to Ramus, LIMA to LAD (N/A) TRANSESOPHAGEAL ECHOCARDIOGRAM (TEE) (N/A) -  CV- went into atrial fib with RVR last night  Amiodarone drip initiated last night, increase metoprolol  Co-ox 59- wean dopamine off RESP- continue IS RENAL- creatinine better at 1.35- near baseline  PO lasix ENDO- CBG remain elevated- glucotrol restarted, increase levemir, meal coverage + SSI Will start enoxaparin for DVT prophylaxis Ambulate   LOS: 4 days    Loreli Slot 11/13/2018

## 2018-11-13 NOTE — Progress Notes (Signed)
  Amiodarone Drug - Drug Interaction Consult Note  Recommendations: Watch CBG, monitor zofran use Amiodarone is metabolized by the cytochrome P450 system and therefore has the potential to cause many drug interactions. Amiodarone has an average plasma half-life of 50 days (range 20 to 100 days).   There is potential for drug interactions to occur several weeks or months after stopping treatment and the onset of drug interactions may be slow after initiating amiodarone.   [x]  Statins: Increased risk of myopathy. Simvastatin- restrict dose to 20mg  daily. Other statins: counsel patients to report any muscle pain or weakness immediately.  []  Anticoagulants: Amiodarone can increase anticoagulant effect. Consider warfarin dose reduction. Patients should be monitored closely and the dose of anticoagulant altered accordingly, remembering that amiodarone levels take several weeks to stabilize.  []  Antiepileptics: Amiodarone can increase plasma concentration of phenytoin, the dose should be reduced. Note that small changes in phenytoin dose can result in large changes in levels. Monitor patient and counsel on signs of toxicity.  [x]  Beta blockers: increased risk of bradycardia, AV block and myocardial depression. Sotalol - avoid concomitant use.  []   Calcium channel blockers (diltiazem and verapamil): increased risk of bradycardia, AV block and myocardial depression.  []   Cyclosporine: Amiodarone increases levels of cyclosporine. Reduced dose of cyclosporine is recommended.  []  Digoxin dose should be halved when amiodarone is started.  []  Diuretics: increased risk of cardiotoxicity if hypokalemia occurs.  [x]  Oral hypoglycemic agents (glyburide, glipizide, glimepiride): increased risk of hypoglycemia. Patient's glucose levels should be monitored closely when initiating amiodarone therapy.   []  Drugs that prolong the QT interval:  Torsades de pointes risk may be increased with concurrent use - avoid  if possible.  Monitor QTc, also keep magnesium/potassium WNL if concurrent therapy can't be avoided. Marland Kitchen Antibiotics: e.g. fluoroquinolones, erythromycin. . Antiarrhythmics: e.g. quinidine, procainamide, disopyramide, sotalol. . Antipsychotics: e.g. phenothiazines, haloperidol.  . Lithium, tricyclic antidepressants, and methadone. Thank You,  Abran Duke  11/13/2018 12:11 AM

## 2018-11-13 NOTE — Progress Notes (Signed)
Physical Therapy Treatment Patient Details Name: Eric [name] MRN: [id number] DOB: May 20, 1961 Today's Date: 11/13/2018    History of Present Illness Pt adm with chest pain. 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 PMH - uncontrolled DM, neuropathy, TIA, fatty liver    PT Comments    Patient seen for activity progression and ambulation. Continues to require cues and assist for mobility. Current POC remains appropriate. VSS throughout on 2 liters.   Follow Up Recommendations  Home health PT;Supervision/Assistance - 24 hour     Equipment Recommendations  Other (comment)(rolling walker vs rollator)    Recommendations for Other Services OT consult     Precautions / Restrictions Precautions Precautions: Fall;Sternal Precaution Booklet Issued: No Precaution Comments: verbally educated pt and wife concerning sternal precautions during mobility and ADL Restrictions Weight Bearing Restrictions: Yes Other Position/Activity Restrictions: Sternal precautions    Mobility  Bed Mobility                  Transfers Overall transfer level: Needs assistance Equipment used: (eva walker) Transfers: Sit to/from Stand Sit to Stand: Min assist         General transfer comment: Min assist to power up to standing and provide stability throughout transitional movements. Vcs for compliance and hand positioning re: precautions  Ambulation/Gait Ambulation/Gait assistance: Min assist;+2 physical assistance;+2 safety/equipment Gait Distance (Feet): 300 Feet Assistive device: (eva walker) Gait Pattern/deviations: Step-through pattern;Decreased stride length;Trunk flexed Gait velocity: decreased Gait velocity interpretation: <1.8 ft/sec, indicate of risk for recurrent falls General Gait Details: VCs for upright posture and attention to task/environement. ASsist for stability and additional assist for control of ambulation equipment   Stairs             Wheelchair Mobility    Modified  Rankin (Stroke Patients Only)       Balance Overall balance assessment: Needs assistance Sitting-balance support: Feet supported;Bilateral upper extremity supported Sitting balance-Leahy Scale: Poor Sitting balance - Comments: UE support   Standing balance support: Bilateral upper extremity supported Standing balance-Leahy Scale: Poor Standing balance comment: reliance on UE support                            Cognition Arousal/Alertness: Awake/alert Behavior During Therapy: WFL for tasks assessed/performed;Restless Overall Cognitive Status: Impaired/Different from baseline Area of Impairment: Attention;Memory;Following commands;Safety/judgement;Awareness;Problem solving                   Current Attention Level: Alternating Memory: Decreased recall of precautions Following Commands: Follows one step commands inconsistently;Follows multi-step commands inconsistently Safety/Judgement: Decreased awareness of safety;Decreased awareness of deficits Awareness: Emergent Problem Solving: Slow processing General Comments: wife reports noting some baseline memory problems. Pt resistant to education      Exercises      General Comments        Pertinent Vitals/Pain Pain Assessment: Faces Pain Score: (verbally reports a 9/10 but NAD) Faces Pain Scale: Hurts little more Pain Location: chest and legs; grimacing during bed mobility Pain Descriptors / Indicators: Burning;Operative site guarding Pain Intervention(s): Limited activity within patient's tolerance;Monitored during session;Repositioned    Home Living Family/patient expects to be discharged to:: (P) Private residence Living Arrangements: (P) Spouse/significant other Available Help at Discharge: (P) Family Type of Home: (P) Mobile home Home Access: (P) Stairs to enter Entrance Stairs-Rails: (P) Right Home Layout: (P) One level Home Equipment: (P) Cane - single point Additional Comments: (P) Need  further bathroom set-up information    Prior Function Level  of Independence: (P) Independent      Comments: (P) Sedentary; likes to watch TV   PT Goals (current goals can now be found in the care plan section) Acute Rehab PT Goals Patient Stated Goal: drive and watch TV PT Goal Formulation: With patient/family Time For Goal Achievement: 11/26/18 Potential to Achieve Goals: Fair Progress towards PT goals: Progressing toward goals    Frequency    Min 3X/week      PT Plan Current plan remains appropriate    Co-evaluation PT/OT/SLP Co-Evaluation/Treatment: Yes Reason for Co-Treatment: Complexity of the patient's impairments (multi-system involvement) PT goals addressed during session: Mobility/safety with mobility OT goals addressed during session: ADL's and self-care      AM-PAC PT "6 Clicks" Mobility   Outcome Measure  Help needed turning from your back to your side while in a flat bed without using bedrails?: A Lot Help needed moving from lying on your back to sitting on the side of a flat bed without using bedrails?: A Lot Help needed moving to and from a bed to a chair (including a wheelchair)?: A Little Help needed standing up from a chair using your arms (e.g., wheelchair or bedside chair)?: A Little Help needed to walk in hospital room?: A Little Help needed climbing 3-5 steps with a railing? : A Lot 6 Click Score: 15    End of Session Equipment Utilized During Treatment: Gait belt;Oxygen Activity Tolerance: Patient limited by fatigue Patient left: in chair;with call bell/phone within reach;with family/visitor present Nurse Communication: Mobility status PT Visit Diagnosis: Unsteadiness on feet (R26.81);Other abnormalities of gait and mobility (R26.89);Muscle weakness (generalized) (M62.81)     Time: 5366-4403 PT Time Calculation (min) (ACUTE ONLY): 21 min  Charges:  $Gait Training: 8-22 mins                     Charlotte Crumb, PT DPT  Board Certified  Neurologic Specialist Acute Rehabilitation Services Pager [phone] Office [phone]    Fabio Asa 11/13/2018, 11:05 AM

## 2018-11-13 NOTE — Progress Notes (Signed)
Progress Note  Patient Name: Eric [name] Date of Encounter: 11/13/2018  Primary Cardiologist:   Thurmon Fair, MD   Subjective   Chest sore with coughing.   Inpatient Medications    Scheduled Meds: . acetaminophen  1,000 mg Oral Q6H  . allopurinol  300 mg Oral Daily  . aspirin EC  325 mg Oral Daily  . atorvastatin  80 mg Oral q1800  . bisacodyl  10 mg Oral Daily  . Chlorhexidine Gluconate Cloth  6 each Topical Daily  . Chlorhexidine Gluconate Cloth  6 each Topical Daily  . docusate sodium  200 mg Oral Daily  . gabapentin  800 mg Oral TID  . glipiZIDE  5 mg Oral Daily  . insulin aspart  0-24 Units Subcutaneous Q4H  . insulin aspart  10 Units Subcutaneous TID WC  . insulin detemir  30 Units Subcutaneous BID  . mouth rinse  15 mL Mouth Rinse BID  . metoprolol tartrate  12.5 mg Oral BID  . multivitamin with minerals  1 tablet Oral Once per day on Mon Thu  . mupirocin ointment  1 application Nasal BID  . pantoprazole  40 mg Oral Daily  . sodium chloride flush  10-40 mL Intracatheter Q12H  . sodium chloride flush  3 mL Intravenous Q12H   Continuous Infusions: . sodium chloride    . amiodarone 30 mg/hr (11/13/18 0700)  . DOPamine 2.5 mcg/kg/min (11/13/18 0700)  . insulin Stopped (11/12/18 0949)  . lactated ringers    . lactated ringers 10 mL/hr at 11/13/18 0700  . phenylephrine (NEO-SYNEPHRINE) Adult infusion Stopped (11/11/18 1333)   PRN Meds: cyclobenzaprine, fluticasone, metoprolol tartrate, morphine injection, ondansetron (ZOFRAN) IV, oxyCODONE, sodium chloride flush, sodium chloride flush, traMADol   Vital Signs    Vitals:   11/13/18 0400 11/13/18 0500 11/13/18 0600 11/13/18 0700  BP: 99/68 103/77 95/68 (!) 150/97  Pulse: 82 83 80 84  Resp: 16 (!) 22 15 16   Temp:      TempSrc:      SpO2: 94% 98% 97% 93%  Weight:   94.8 kg   Height:        Intake/Output Summary (Last 24 hours) at 11/13/2018 0742 Last data filed at 11/13/2018 0645 Gross per 24 hour   Intake 1547.5 ml  Output 1845 ml  Net -297.5 ml   Filed Weights   11/11/18 0600 11/12/18 0600 11/13/18 0600  Weight: 92.3 kg 93.1 kg 94.8 kg    Telemetry    Atrial fib with RVR - Personally Reviewed  ECG    NA - Personally Reviewed  Physical Exam   GEN: No acute distress.   Neck: No  JVD Cardiac: Irregular RR, no murmurs, rubs, or gallops.  Respiratory: Clear to auscultation bilaterally. GI: Soft, nontender, non-distended  MS:   Diffuse mild edema; No deformity. Neuro:  Nonfocal  Psych: Normal affect   Labs    Chemistry Recent Labs  Lab 11/09/18 2238  11/11/18 1700 11/12/18 0325 11/13/18 0500  NA 134*   < > 137 136 133*  K 3.8   < > 4.9 3.8 4.2  CL 103   < > 107 104 99  CO2 24   < > 21* 24 25  GLUCOSE 288*   < > 319* 231* 315*  BUN 16   < > 14 18 24*  CREATININE 1.01   < > 1.41* 1.57* 1.35*  CALCIUM 8.2*   < > 7.7* 7.9* 8.0*  PROT 6.0*  --   --   --   --  ALBUMIN 2.9*  --   --   --   --   AST 40  --   --   --   --   ALT 44  --   --   --   --   ALKPHOS 57  --   --   --   --   BILITOT 0.9  --   --   --   --   GFRNONAA >60   < > 55* 48* 58*  GFRAA >60   < > >60 56* >60  ANIONGAP 7   < > < > = values in this interval not displayed.     Hematology Recent Labs  Lab 11/11/18 1700 11/12/18 0325 11/13/18 0500  WBC 19.5* 13.4* 12.6*  RBC 4.40 4.01* 3.93*  HGB 13.2 11.5* 11.4*  HCT 39.4 35.4* 35.3*  MCV 89.5 88.3 89.8  MCH 30.0 28.7 29.0  MCHC 33.5 32.5 32.3  RDW 12.8 12.5 12.8  PLT 133* 112* 126*    Cardiac EnzymesNo results for input(s): TROPONINI in the last 168 hours. No results for input(s): TROPIPOC in the last 168 hours.   BNPNo results for input(s): BNP, PROBNP in the last 168 hours.   DDimer No results for input(s): DDIMER in the last 168 hours.   Radiology    Dg Chest Port 1 View  Result Date: 11/13/2018 CLINICAL DATA:  Chest tube in place EXAM: PORTABLE CHEST 1 VIEW COMPARISON:  11/12/2018 FINDINGS: No chest tube is  visualized. Mild left basilar atelectasis. Low lung volumes. No pleural effusion or pneumothorax. Mild cardiomegaly.  Postsurgical changes related to prior CABG. Right IJ venous sheath. Median sternotomy. IMPRESSION: Stable postoperative changes related to prior CABG. Low lung volumes with mild left basilar atelectasis. Electronically Signed   By: Charline Bills M.D.   On: 11/13/2018 06:52   Dg Chest Port 1 View  Result Date: 11/12/2018 CLINICAL DATA:  Coronary bypass EXAM: PORTABLE CHEST 1 VIEW COMPARISON:  11/11/2018 FINDINGS: Swan-Ganz catheter and left chest tube have been removed. Right IJ vascular sheath remains at the innominate venous confluence. Previous coronary bypass changes noted. Stable cardiomegaly with central vascular congestion, low lung volumes. Increased left basilar atelectasis/consolidation. Small left effusion not excluded. No pneumothorax. Trachea is midline. IMPRESSION: Persistent cardiomegaly with vascular congestion and low lung volumes Increased left lower lobe atelectasis/consolidation No pneumothorax Electronically Signed   By: Judie Petit.  Shick M.D.   On: 11/12/2018 08:42    Cardiac Studies   TEE   Left atrium: No spontaneous echo contrast.  Aortic valve: The valve is trileaflet. No stenosis. No regurgitation.  Right ventricle: Normal cavity size, wall thickness and ejection fraction.  Tricuspid valve: Trace regurgitation.  Pulmonic valve: Trace regurgitation.    Patient Profile     58 y.o. male with a hx of type 2 diabetes mellitus, poorly controlled and complicated by polyneuropathy, hyperlipidemia, fatty liver, history of TIA, gastroesophageal reflux disease.   Assessment & Plan    CAD/CABG:    Slow progress.  Per CVTS.    ATRIAL FIB:    Went back into atrial fib early this morning.  Was NSR for several hours prior to that.  Continue IV amiodarone.    DM:    Diabetes has been poorly controlled.  A1c 12.8.  Will plan SGLT2 when better taking POs prior to  discharge.  Levemir increased yesterday.   DYSLIPIDEMIA:  On high dose statin.    HTN:  BP fluctuates.  SLEEP APNEA:    Has CPAP at home.    For questions or updates, please contact [facility] Please consult [email or web address] for contact info under Cardiology/STEMI.   Signed, Rollene Rotunda, MD  11/13/2018, 7:42 AM

## 2018-11-13 NOTE — Progress Notes (Signed)
      [address]       [place] [address]             [phone]      No complaints  BP (!) 84/64 Comment: increased dopamine to 2.5 mcg/kg  Pulse 70   Temp 97.9 F (36.6 C) (Oral)   Resp (!) 21   Ht 5\' 6"  (1.676 m)   Wt 94.8 kg   SpO2 (!) 85%   BMI 33.73 kg/m   Intake/Output Summary (Last 24 hours) at 11/13/2018 1716 Last data filed at 11/13/2018 1600 Gross per 24 hour  Intake 2016.35 ml  Output 1515 ml  Net 501.35 ml   Bp has been a little soft so still on dopamine but is back in SR Will decrease lopressor this evening  Viviann Spare C. Dorris Fetch, MD [facility] [phone]

## 2018-11-13 NOTE — Evaluation (Signed)
Occupational Therapy Evaluation Patient Details Name: Eric [name] MRN: [id number] DOB: 10/25/1960 Today's Date: 11/13/2018    History of Present Illness Pt adm with chest pain. 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 PMH - uncontrolled DM, neuropathy, TIA, fatty liver   Clinical Impression   PTA, pt was independent with ADL and functional mobility but maintains a rather sedentary lifestyle. Pt currently with some difficulty with processing, memory, and recall of precautions. He requires max assist for dressing and bathing while adhering to precautions, mod assist for standing grooming with UE support Eric [name] walker), and min assist for ambulation with Eric [name] walker due to difficulty with balance and maintaining midline positioning when standing and walking. Pt would benefit from continued OT services while admitted to improve independence and safety with ADL and functional mobility. Recommend home health OT follow-up at this time. OT will continue to follow while admitted.     Follow Up Recommendations  Home health OT;Supervision/Assistance - 24 hour    Equipment Recommendations  3 in 1 bedside commode    Recommendations for Other Services       Precautions / Restrictions Precautions Precautions: Fall;Sternal Precaution Booklet Issued: No Precaution Comments: verbally educated pt and wife concerning sternal precautions during mobility and ADL Restrictions Weight Bearing Restrictions: Yes Other Position/Activity Restrictions: Sternal precautions      Mobility Bed Mobility Overal bed mobility: Needs Assistance Bed Mobility: Rolling;Sidelying to Sit Rolling: Mod assist Sidelying to sit: Mod assist       General bed mobility comments: Overall mod assist with step-by-step cues.   Transfers Overall transfer level: Needs assistance Equipment used: (eva walker) Transfers: Sit to/from Stand Sit to Stand: Min assist         General transfer comment: Min assist to power up to standing and  provide stability throughout transitional movements. Vcs for compliance and hand positioning re: precautions    Balance Overall balance assessment: Needs assistance Sitting-balance support: Feet supported;Bilateral upper extremity supported Sitting balance-Leahy Scale: Poor Sitting balance - Comments: UE support   Standing balance support: Bilateral upper extremity supported Standing balance-Leahy Scale: Poor Standing balance comment: reliance on UE support                           ADL either performed or assessed with clinical judgement   ADL Overall ADL's : Needs assistance/impaired Eating/Feeding: Set up;Sitting   Grooming: Moderate assistance;Standing Grooming Details (indicate cue type and reason): unable to sustain balance in standing without UE support from [facility] walker Upper Body Bathing: Maximal assistance;Sitting Upper Body Bathing Details (indicate cue type and reason): max assist due to difficulty following precautions Lower Body Bathing: Maximal assistance;Sit to/from stand Lower Body Bathing Details (indicate cue type and reason): was previously unable to access his feet Upper Body Dressing : Maximal assistance;Sitting Upper Body Dressing Details (indicate cue type and reason): max assist due to difficulty following precautions Lower Body Dressing: Maximal assistance;Sit to/from stand   Toilet Transfer: Minimal assistance;Ambulation;RW Toilet Transfer Details (indicate cue type and reason): Using Eva walker; max cues Toileting- Clothing Manipulation and Hygiene: Maximal assistance;Sit to/from stand Toileting - Clothing Manipulation Details (indicate cue type and reason): max assist for maintaining sternal precautions     Functional mobility during ADLs: Minimal assistance;Rolling walker General ADL Comments: Using Eva walker; unable to sustain midline positioning (both swaying left and right but primarily right) during ambulation and static standing; no  attempts to correct     Vision Patient Visual Report: No  change from baseline Vision Assessment?: No apparent visual deficits     Perception     Praxis      Pertinent Vitals/Pain Pain Assessment: Faces Pain Score: (verbally reports a 9/10 but NAD) Faces Pain Scale: Hurts little more Pain Location: chest and legs; grimacing during bed mobility Pain Descriptors / Indicators: Burning;Operative site guarding Pain Intervention(s): Limited activity within patient's tolerance;Monitored during session;Repositioned     Hand Dominance     Extremity/Trunk Assessment Upper Extremity Assessment Upper Extremity Assessment: Overall WFL for tasks assessed   Lower Extremity Assessment Lower Extremity Assessment: RLE deficits/detail;LLE deficits/detail RLE Sensation: history of peripheral neuropathy LLE Sensation: history of peripheral neuropathy       Communication Communication Communication: No difficulties   Cognition Arousal/Alertness: Awake/alert Behavior During Therapy: WFL for tasks assessed/performed;Restless Overall Cognitive Status: Impaired/Different from baseline Area of Impairment: Attention;Memory;Following commands;Safety/judgement;Awareness;Problem solving                   Current Attention Level: Alternating Memory: Decreased recall of precautions Following Commands: Follows one step commands inconsistently;Follows multi-step commands inconsistently Safety/Judgement: Decreased awareness of safety;Decreased awareness of deficits Awareness: Emergent Problem Solving: Slow processing General Comments: wife reports noting some baseline memory problems. Pt resistant to education   General Comments  Pt resistant to mobility.     Exercises     Shoulder Instructions      Home Living Family/patient expects to be discharged to:: Private residence Living Arrangements: Spouse/significant other Available Help at Discharge: Family Type of Home: Mobile home Home  Access: Stairs to enter Secretary/administrator of Steps: 4 Entrance Stairs-Rails: Right Home Layout: One level               Home Equipment: Cane - single point   Additional Comments: Need further bathroom set-up information      Prior Functioning/Environment Level of Independence: Independent        Comments: Sedentary; likes to watch TV        OT Problem List: Decreased strength;Decreased range of motion;Decreased activity tolerance;Impaired balance (sitting and/or standing);Decreased cognition;Decreased safety awareness;Decreased knowledge of use of DME or AE;Decreased knowledge of precautions;Cardiopulmonary status limiting activity;Pain;Impaired UE functional use      OT Treatment/Interventions: Self-care/ADL training;Therapeutic exercise;Energy conservation;DME and/or AE instruction;Therapeutic activities;Patient/family education;Balance training;Cognitive remediation/compensation    OT Goals(Current goals can be found in the care plan section) Acute Rehab OT Goals Patient Stated Goal: drive and watch TV OT Goal Formulation: With patient/family Time For Goal Achievement: 11/27/18 Potential to Achieve Goals: Good ADL Goals Pt Will Perform Grooming: with supervision;standing Pt Will Perform Lower Body Dressing: with min assist;sit to/from stand;with adaptive equipment Pt Will Transfer to Toilet: with supervision;ambulating;bedside commode;regular height toilet Pt Will Perform Toileting - Clothing Manipulation and hygiene: with supervision;with adaptive equipment;sit to/from stand Additional ADL Goal #1: Pt will verbalize and implement all sternal precautions during dressing and bathing tasks.  OT Frequency: Min 2X/week   Barriers to D/C:            Co-evaluation PT/OT/SLP Co-Evaluation/Treatment: Yes Reason for Co-Treatment: Complexity of the patient's impairments (multi-system involvement) PT goals addressed during session: Mobility/safety with mobility OT  goals addressed during session: ADL's and self-care      AM-PAC OT "6 Clicks" Daily Activity     Outcome Measure Help from another person eating meals?: None Help from another person taking care of personal grooming?: A Lot Help from another person toileting, which includes using toliet, bedpan, or urinal?: A Lot Help from another person bathing (  including washing, rinsing, drying)?: A Lot Help from another person to put on and taking off regular upper body clothing?: A Lot Help from another person to put on and taking off regular lower body clothing?: A Lot 6 Click Score: 14   End of Session Equipment Utilized During Treatment: Gait belt;Rolling walker;Oxygen  Activity Tolerance: Patient tolerated treatment well Patient left: in chair;with call bell/phone within reach;with family/visitor present  OT Visit Diagnosis: Other abnormalities of gait and mobility (R26.89);Muscle weakness (generalized) (M62.81);Other symptoms and signs involving cognitive function                Time: 1036-1056 OT Time Calculation (min): 20 min Charges:  OT General Charges $OT Visit: 1 Visit OT Evaluation $OT Eval Moderate Complexity: 1 [address]Mod  Marin Milley [place], OTR/L Acute Rehabilitation Services Office [phone]   Idonia Zollinger A Abdulah Iqbal 11/13/2018, 11:12 AM

## 2018-11-14 ENCOUNTER — Inpatient Hospital Stay (HOSPITAL_COMMUNITY): Payer: Medicare Other

## 2018-11-14 LAB — CBC
HEMATOCRIT: 32.9 % — AB (ref 39.0–52.0)
Hemoglobin: 10.9 g/dL — ABNORMAL LOW (ref 13.0–17.0)
MCH: 29.6 pg (ref 26.0–34.0)
MCHC: 33.1 g/dL (ref 30.0–36.0)
MCV: 89.4 fL (ref 80.0–100.0)
Platelets: 149 10*3/uL — ABNORMAL LOW (ref 150–400)
RBC: 3.68 MIL/uL — ABNORMAL LOW (ref 4.22–5.81)
RDW: 12.8 % (ref 11.5–15.5)
WBC: 11.5 10*3/uL — ABNORMAL HIGH (ref 4.0–10.5)
nRBC: 0 % (ref 0.0–0.2)

## 2018-11-14 LAB — GLUCOSE, CAPILLARY
Glucose-Capillary: 108 mg/dL — ABNORMAL HIGH (ref 70–99)
Glucose-Capillary: 164 mg/dL — ABNORMAL HIGH (ref 70–99)
Glucose-Capillary: 179 mg/dL — ABNORMAL HIGH (ref 70–99)
Glucose-Capillary: 56 mg/dL — ABNORMAL LOW (ref 70–99)
Glucose-Capillary: 59 mg/dL — ABNORMAL LOW (ref 70–99)
Glucose-Capillary: 74 mg/dL (ref 70–99)
Glucose-Capillary: 97 mg/dL (ref 70–99)

## 2018-11-14 LAB — BASIC METABOLIC PANEL
Anion gap: 6 (ref 5–15)
BUN: 23 mg/dL — ABNORMAL HIGH (ref 6–20)
CO2: 28 mmol/L (ref 22–32)
Calcium: 8 mg/dL — ABNORMAL LOW (ref 8.9–10.3)
Chloride: 100 mmol/L (ref 98–111)
Creatinine, Ser: 1.11 mg/dL (ref 0.61–1.24)
GFR calc Af Amer: >60 mL/min (ref >60)
GFR calc non Af Amer: >60 mL/min (ref >60)
Glucose, Bld: 124 mg/dL — ABNORMAL HIGH (ref 70–99)
Potassium: 3.9 mmol/L (ref 3.5–5.1)
Sodium: 134 mmol/L — ABNORMAL LOW (ref 135–145)

## 2018-11-14 LAB — COOXEMETRY PANEL
Carboxyhemoglobin: 1.5 % (ref 0.5–1.5)
Methemoglobin: 0.9 % (ref 0.0–1.5)
O2 Saturation: 57.8 %
Total hemoglobin: 11.4 g/dL — ABNORMAL LOW (ref 12.0–16.0)

## 2018-11-14 MED ORDER — INSULIN DETEMIR 100 UNIT/ML ~~LOC~~ SOLN
25.0000 [IU] | Freq: Two times a day (BID) | SUBCUTANEOUS | Status: DC
  Administered 2018-11-14 – 2018-11-18 (×9): 25 [IU] via SUBCUTANEOUS
  Filled 2018-11-14 (×14): qty 0.25

## 2018-11-14 MED ORDER — AMIODARONE HCL 200 MG PO TABS
400.0000 mg | ORAL_TABLET | Freq: Two times a day (BID) | ORAL | Status: DC
  Administered 2018-11-14 – 2018-11-16 (×6): 400 mg via ORAL
  Filled 2018-11-14 (×6): qty 2

## 2018-11-14 MED ORDER — INSULIN ASPART 100 UNIT/ML ~~LOC~~ SOLN
5.0000 [IU] | Freq: Three times a day (TID) | SUBCUTANEOUS | Status: DC
  Administered 2018-11-14 – 2018-11-17 (×10): 5 [IU] via SUBCUTANEOUS

## 2018-11-14 MED ORDER — METFORMIN HCL 500 MG PO TABS
1000.0000 mg | ORAL_TABLET | Freq: Two times a day (BID) | ORAL | Status: DC
  Administered 2018-11-14 – 2018-11-19 (×9): 1000 mg via ORAL
  Filled 2018-11-14 (×9): qty 2

## 2018-11-14 MED ORDER — DEXTROSE 50 % IV SOLN
INTRAVENOUS | Status: AC
  Administered 2018-11-14: 50 mL
  Filled 2018-11-14: qty 50

## 2018-11-14 MED ORDER — NICOTINE 14 MG/24HR TD PT24
14.0000 mg | MEDICATED_PATCH | Freq: Every day | TRANSDERMAL | Status: DC
  Administered 2018-11-14 – 2018-11-19 (×6): 14 mg via TRANSDERMAL
  Filled 2018-11-14 (×6): qty 1

## 2018-11-14 NOTE — Progress Notes (Signed)
Spoke with pt about wearing CPAP for the night. Pt states he isn't ready to go on CPAP at this time, and he is not sure he wants to wear it tonight. Pt states he didn't sleep great while he was wearing it the previous night. Pt tried nasal mask last night, but wears nasal pillows with home CPAP and states there is no one available to bring his home mask to the hospital. RT will check back later.

## 2018-11-14 NOTE — Progress Notes (Signed)
4 Days Post-Op Procedure(s) (LRB): CORONARY ARTERY BYPASS GRAFTING (CABG) x Two , using left internal mammary artery and right leg greater saphenous vein harvested endoscopically - SVG to Ramus, LIMA to LAD (N/A) TRANSESOPHAGEAL ECHOCARDIOGRAM (TEE) (N/A) Subjective: No complaints this AM  Objective: Vital signs in last 24 hours: Temp:  [96.7 F (35.9 C)-99.2 F (37.3 C)] 99.2 F (37.3 C) (03/15 0400) Pulse Rate:  [67-117] 73 (03/15 0500) Cardiac Rhythm: Normal sinus rhythm (03/14 2100) Resp:  [11-25] 17 (03/15 0600) BP: (83-134)/(57-89) 134/89 (03/15 0600) SpO2:  [85 %-100 %] 100 % (03/15 0600) Weight:  [96.7 kg] 96.7 kg (03/15 0600)  Hemodynamic parameters for last 24 hours:    Intake/Output from previous day: 03/14 0701 - 03/15 0700 In: 2002.4 [P.O.:1200; I.V.:802.4] Out: 795 [Urine:795] Intake/Output this shift: No intake/output data recorded.  General appearance: alert, cooperative and no distress Neurologic: intact Heart: regular rate and rhythm Lungs: diminished breath sounds bibasilar Abdomen: normal findings: soft, non-tender  Lab Results: Recent Labs    11/13/18 0500 11/14/18 0430  WBC 12.6* 11.5*  HGB 11.4* 10.9*  HCT 35.3* 32.9*  PLT 126* 149*   BMET:  Recent Labs    11/13/18 0500 11/14/18 0430  NA 133* 134*  K 4.2 3.9  CL 99 100  CO2 25 28  GLUCOSE 315* 124*  BUN 24* 23*  CREATININE 1.35* 1.11  CALCIUM 8.0* 8.0*    PT/INR: No results for input(s): LABPROT, INR in the last 72 hours. ABG    Component Value Date/Time   PHART 7.340 (L) 11/11/2018 0636   HCO3 22.5 11/11/2018 0636   TCO2 24 11/11/2018 0636   ACIDBASEDEF 3.0 (H) 11/11/2018 0636   O2SAT 57.8 11/14/2018 0443   CBG (last 3)  Recent Labs    11/13/18 2350 11/14/18 0418 11/14/18 0732  GLUCAP 132* 108* 74    Assessment/Plan: S/P Procedure(s) (LRB): CORONARY ARTERY BYPASS GRAFTING (CABG) x Two , using left internal mammary artery and right leg greater saphenous vein  harvested endoscopically - SVG to Ramus, LIMA to LAD (N/A) TRANSESOPHAGEAL ECHOCARDIOGRAM (TEE) (N/A) -CV- - in SR this am, co-ox 58 on 2.5 of dopamine  Unable to wean dopamine yesterday due to BP- will try again today  postop atrial fib- Change amio to PO RESP- LLL atelectasis- continue IS, flutter RENAL- creatinine back to normal, will hold lasix today to help with weaning dopamine ENDO- CBG better controlled- will resume metformin, decrease levemir and novolog Cardiac rehab   LOS: 5 days    Loreli Slot 11/14/2018

## 2018-11-14 NOTE — Progress Notes (Signed)
Progress Note  Patient Name: Eric [name] Date of Encounter: 11/14/2018  Primary Cardiologist:   Thurmon Fair, MD   Subjective   Chest sore but no distress  Inpatient Medications    Scheduled Meds: . acetaminophen  1,000 mg Oral Q6H  . allopurinol  300 mg Oral Daily  . amiodarone  400 mg Oral BID  . aspirin EC  325 mg Oral Daily  . atorvastatin  80 mg Oral q1800  . bisacodyl  10 mg Oral Daily  . Chlorhexidine Gluconate Cloth  6 each Topical Daily  . Chlorhexidine Gluconate Cloth  6 each Topical Daily  . docusate sodium  200 mg Oral Daily  . enoxaparin (LOVENOX) injection  40 mg Subcutaneous Q24H  . gabapentin  800 mg Oral TID  . glipiZIDE  5 mg Oral Daily  . insulin aspart  0-20 Units Subcutaneous TID WC  . insulin aspart  0-5 Units Subcutaneous QHS  . insulin aspart  5 Units Subcutaneous TID WC  . insulin detemir  25 Units Subcutaneous BID  . mouth rinse  15 mL Mouth Rinse BID  . metFORMIN  1,000 mg Oral BID WC  . metoprolol tartrate  12.5 mg Oral BID  . multivitamin with minerals  1 tablet Oral Once per day on Mon Thu  . mupirocin ointment  1 application Nasal BID  . nicotine  14 mg Transdermal Daily  . pantoprazole  40 mg Oral Daily  . sodium chloride flush  10-40 mL Intracatheter Q12H  . sodium chloride flush  3 mL Intravenous Q12H   Continuous Infusions: . sodium chloride    . amiodarone 30 mg/hr (11/14/18 0600)  . DOPamine 2.5 mcg/kg/min (11/14/18 0600)  . lactated ringers    . lactated ringers 10 mL/hr at 11/14/18 0600  . phenylephrine (NEO-SYNEPHRINE) Adult infusion Stopped (11/11/18 1333)   PRN Meds: cyclobenzaprine, fluticasone, metoprolol tartrate, morphine injection, ondansetron (ZOFRAN) IV, oxyCODONE, sodium chloride flush, sodium chloride flush, traMADol   Vital Signs    Vitals:   11/14/18 0400 11/14/18 0500 11/14/18 0600 11/14/18 0805  BP: (!) 86/59 108/78 134/89   Pulse: 74 73    Resp: 18 15 17    Temp: 99.2 F (37.3 C)   99 F (37.2  C)  TempSrc: Oral   Oral  SpO2: 93% 93% 100%   Weight:   96.7 kg   Height:        Intake/Output Summary (Last 24 hours) at 11/14/2018 0928 Last data filed at 11/14/2018 0600 Gross per 24 hour  Intake 1342.64 ml  Output 620 ml  Net 722.64 ml   Filed Weights   11/12/18 0600 11/13/18 0600 11/14/18 0600  Weight: 93.1 kg 94.8 kg 96.7 kg    Telemetry    Atrial fib with RVR - Personally Reviewed  ECG    NA - Personally Reviewed  Physical Exam   GEN: No acute distress.   Neck: No  JVD Cardiac: Irregular RR, no murmurs, rubs, or gallops.  Respiratory: Clear to auscultation bilaterally. GI: Soft, nontender, non-distended  MS:   Diffuse mild edema; No deformity. Neuro:  Nonfocal  Psych: Normal affect   Labs    Chemistry Recent Labs  Lab 11/09/18 2238  11/12/18 0325 11/13/18 0500 11/14/18 0430  NA 134*   < > 136 133* 134*  K 3.8   < > 3.8 4.2 3.9  CL 103   < > 104 99 100  CO2 24   < > 24 25 28   GLUCOSE 288*   < >  231* 315* 124*  BUN 16   < > 18 24* 23*  CREATININE 1.01   < > 1.57* 1.35* 1.11  CALCIUM 8.2*   < > 7.9* 8.0* 8.0*  PROT 6.0*  --   --   --   --   ALBUMIN 2.9*  --   --   --   --   AST 40  --   --   --   --   ALT 44  --   --   --   --   ALKPHOS 57  --   --   --   --   BILITOT 0.9  --   --   --   --   GFRNONAA >60   < > 48* 58* >60  GFRAA >60   < > 56* >60 >60  ANIONGAP 7   < > 8 9 6    < > = values in this interval not displayed.     Hematology Recent Labs  Lab 11/12/18 0325 11/13/18 0500 11/14/18 0430  WBC 13.4* 12.6* 11.5*  RBC 4.01* 3.93* 3.68*  HGB 11.5* 11.4* 10.9*  HCT 35.4* 35.3* 32.9*  MCV 88.3 89.8 89.4  MCH 28.7 29.0 29.6  MCHC 32.5 32.3 33.1  RDW 12.5 12.8 12.8  PLT 112* 126* 149*    Cardiac EnzymesNo results for input(s): TROPONINI in the last 168 hours. No results for input(s): TROPIPOC in the last 168 hours.   BNPNo results for input(s): BNP, PROBNP in the last 168 hours.   DDimer No results for input(s): DDIMER in the  last 168 hours.   Radiology    Dg Chest Port 1 View  Result Date: 11/14/2018 CLINICAL DATA:  Status post CABG EXAM: PORTABLE CHEST 1 VIEW COMPARISON:  11/13/2018 FINDINGS: Mild lingular/left lower lobe atelectasis. Small left pleural effusion. Mild pulmonary vascular congestion.  No frank interstitial edema. The heart is top-normal in size. Postsurgical changes related to prior CABG. Stable right IJ venous sheath. Median sternotomy. IMPRESSION: Mild lingular/left lower lobe atelectasis with small left pleural effusion. Stable postsurgical changes related to prior CABG. Electronically Signed   By: Charline Bills M.D.   On: 11/14/2018 07:02   Dg Chest Port 1 View  Result Date: 11/13/2018 CLINICAL DATA:  Chest tube in place EXAM: PORTABLE CHEST 1 VIEW COMPARISON:  11/12/2018 FINDINGS: No chest tube is visualized. Mild left basilar atelectasis. Low lung volumes. No pleural effusion or pneumothorax. Mild cardiomegaly.  Postsurgical changes related to prior CABG. Right IJ venous sheath. Median sternotomy. IMPRESSION: Stable postoperative changes related to prior CABG. Low lung volumes with mild left basilar atelectasis. Electronically Signed   By: Charline Bills M.D.   On: 11/13/2018 06:52    Cardiac Studies   TEE   Left atrium: No spontaneous echo contrast.  Aortic valve: The valve is trileaflet. No stenosis. No regurgitation.  Right ventricle: Normal cavity size, wall thickness and ejection fraction.  Tricuspid valve: Trace regurgitation.  Pulmonic valve: Trace regurgitation.    Patient Profile     58 y.o. male with a hx of type 2 diabetes mellitus, poorly controlled and complicated by polyneuropathy, hyperlipidemia, fatty liver, history of TIA, gastroesophageal reflux disease.   Assessment & Plan    CAD/CABG:   Slow progress per primary team.   ATRIAL FIB:    Went back into NSR at 0942 yesterday.   I would continue the IV today and then I would suggest a short course of PO at  discharge.  DM:    Diabetes has been poorly controlled.  A1c 12.8.  Unable to start SGLT2i in patient.  Discussed with pharmacy.  We can begin this as an out patient.    DYSLIPIDEMIA:  On high dose statin.    HTN:   Trying to wean dopamine.  Lasix being held .    SLEEP APNEA:    Has CPAP at home.    For questions or updates, please contact [facility] Please consult [email or web address] for contact info under Cardiology/STEMI.   Signed, Rollene Rotunda, MD  11/14/2018, 9:28 AM

## 2018-11-14 NOTE — Progress Notes (Signed)
      [address]       [place] [address]             [phone]      No complaints this evening  Off dopamine  BP 106/77   Pulse 76   Temp 98.8 F (37.1 C) (Oral)   Resp 16   Ht 5\' 6"  (1.676 m)   Wt 96.7 kg   SpO2 93%   BMI 34.41 kg/m   Intake/Output Summary (Last 24 hours) at 11/14/2018 1742 Last data filed at 11/14/2018 1200 Gross per 24 hour  Intake 1506.74 ml  Output 350 ml  Net 1156.74 ml   Doing well  Viviann Spare C. Dorris Fetch, MD [facility] [phone]

## 2018-11-15 ENCOUNTER — Inpatient Hospital Stay (HOSPITAL_COMMUNITY): Payer: Medicare Other

## 2018-11-15 ENCOUNTER — Other Ambulatory Visit: Payer: Self-pay

## 2018-11-15 DIAGNOSIS — I209 Angina pectoris, unspecified: Secondary | ICD-10-CM

## 2018-11-15 DIAGNOSIS — E1165 Type 2 diabetes mellitus with hyperglycemia: Secondary | ICD-10-CM

## 2018-11-15 DIAGNOSIS — E118 Type 2 diabetes mellitus with unspecified complications: Secondary | ICD-10-CM

## 2018-11-15 DIAGNOSIS — I9789 Other postprocedural complications and disorders of the circulatory system, not elsewhere classified: Secondary | ICD-10-CM

## 2018-11-15 LAB — GLUCOSE, CAPILLARY
Glucose-Capillary: 119 mg/dL — ABNORMAL HIGH (ref 70–99)
Glucose-Capillary: 92 mg/dL (ref 70–99)
Glucose-Capillary: 177 mg/dL — ABNORMAL HIGH (ref 70–99)
Glucose-Capillary: 87 mg/dL (ref 70–99)
Glucose-Capillary: 124 mg/dL — ABNORMAL HIGH (ref 70–99)

## 2018-11-15 LAB — BASIC METABOLIC PANEL
Anion gap: 5 (ref 5–15)
BUN: 18 mg/dL (ref 6–20)
CO2: 28 mmol/L (ref 22–32)
Calcium: 7.9 mg/dL — ABNORMAL LOW (ref 8.9–10.3)
Chloride: 102 mmol/L (ref 98–111)
Creatinine, Ser: 1.07 mg/dL (ref 0.61–1.24)
GFR calc Af Amer: >60 mL/min (ref >60)
GFR calc non Af Amer: >60 mL/min (ref >60)
Glucose, Bld: 102 mg/dL — ABNORMAL HIGH (ref 70–99)
Potassium: 3.9 mmol/L (ref 3.5–5.1)
Sodium: 135 mmol/L (ref 135–145)

## 2018-11-15 LAB — CBC
HCT: 30.7 % — ABNORMAL LOW (ref 39.0–52.0)
HEMOGLOBIN: 10.1 g/dL — AB (ref 13.0–17.0)
MCH: 29.6 pg (ref 26.0–34.0)
MCHC: 32.9 g/dL (ref 30.0–36.0)
MCV: 90 fL (ref 80.0–100.0)
Platelets: 170 10*3/uL (ref 150–400)
RBC: 3.41 MIL/uL — ABNORMAL LOW (ref 4.22–5.81)
RDW: 12.7 % (ref 11.5–15.5)
WBC: 8.4 10*3/uL (ref 4.0–10.5)
nRBC: 0 % (ref 0.0–0.2)

## 2018-11-15 LAB — COOXEMETRY PANEL
Carboxyhemoglobin: 1.4 % (ref 0.5–1.5)
Methemoglobin: 1.7 % — ABNORMAL HIGH (ref 0.0–1.5)
O2 Saturation: 72.5 %
Total hemoglobin: 10.6 g/dL — ABNORMAL LOW (ref 12.0–16.0)

## 2018-11-15 MED ORDER — METOPROLOL TARTRATE 12.5 MG HALF TABLET
12.5000 mg | ORAL_TABLET | Freq: Two times a day (BID) | ORAL | Status: DC
  Administered 2018-11-15 – 2018-11-19 (×9): 12.5 mg via ORAL
  Filled 2018-11-15 (×9): qty 1

## 2018-11-15 MED ORDER — SODIUM CHLORIDE 0.9% FLUSH: 3.0000 mL | INTRAVENOUS | Status: DC | PRN

## 2018-11-15 MED ORDER — FUROSEMIDE 40 MG PO TABS
40.0000 mg | ORAL_TABLET | Freq: Every day | ORAL | Status: AC
  Administered 2018-11-15 – 2018-11-17 (×3): 40 mg via ORAL
  Filled 2018-11-15 (×3): qty 1

## 2018-11-15 MED ORDER — ONDANSETRON HCL 4 MG/2ML IJ SOLN: 4.0000 mg | Freq: Four times a day (QID) | INTRAMUSCULAR | Status: DC | PRN

## 2018-11-15 MED ORDER — OXYCODONE HCL 5 MG PO TABS
5.0000 mg | ORAL_TABLET | ORAL | Status: DC | PRN
  Administered 2018-11-15 – 2018-11-19 (×17): 10 mg via ORAL
  Filled 2018-11-15 (×17): qty 2

## 2018-11-15 MED ORDER — TRAMADOL HCL 50 MG PO TABS
50.0000 mg | ORAL_TABLET | ORAL | Status: DC | PRN
  Administered 2018-11-19: 100 mg via ORAL
  Filled 2018-11-15: qty 2

## 2018-11-15 MED ORDER — SODIUM CHLORIDE 0.9% FLUSH
3.0000 mL | Freq: Two times a day (BID) | INTRAVENOUS | Status: DC
  Administered 2018-11-15 – 2018-11-19 (×9): 3 mL via INTRAVENOUS

## 2018-11-15 MED ORDER — GUAIFENESIN ER 600 MG PO TB12: 600.0000 mg | ORAL_TABLET | Freq: Two times a day (BID) | ORAL | Status: DC | PRN

## 2018-11-15 MED ORDER — BISACODYL 5 MG PO TBEC: 10.0000 mg | DELAYED_RELEASE_TABLET | Freq: Every day | ORAL | Status: DC | PRN

## 2018-11-15 MED ORDER — POTASSIUM CHLORIDE CRYS ER 10 MEQ PO TBCR
10.0000 meq | EXTENDED_RELEASE_TABLET | Freq: Every day | ORAL | Status: DC
  Administered 2018-11-15 – 2018-11-16 (×2): 10 meq via ORAL
  Filled 2018-11-15 (×2): qty 1

## 2018-11-15 MED ORDER — SODIUM CHLORIDE 0.9 % IV SOLN: 250.0000 mL | INTRAVENOUS | Status: DC | PRN

## 2018-11-15 MED ORDER — ONDANSETRON HCL 4 MG PO TABS
4.0000 mg | ORAL_TABLET | Freq: Four times a day (QID) | ORAL | Status: DC | PRN
  Administered 2018-11-17: 4 mg via ORAL
  Filled 2018-11-15: qty 1

## 2018-11-15 MED ORDER — ALUM & MAG HYDROXIDE-SIMETH 200-200-20 MG/5ML PO SUSP: 15.0000 mL | ORAL | Status: DC | PRN

## 2018-11-15 MED ORDER — PANTOPRAZOLE SODIUM 40 MG PO TBEC
40.0000 mg | DELAYED_RELEASE_TABLET | Freq: Every day | ORAL | Status: DC
  Administered 2018-11-16 – 2018-11-19 (×4): 40 mg via ORAL
  Filled 2018-11-15 (×4): qty 1

## 2018-11-15 MED ORDER — CANAGLIFLOZIN 100 MG PO TABS: 100.0000 mg | ORAL_TABLET | Freq: Every day | ORAL | Status: DC

## 2018-11-15 MED ORDER — BISACODYL 10 MG RE SUPP: 10.0000 mg | Freq: Every day | RECTAL | Status: DC | PRN

## 2018-11-15 MED ORDER — DOCUSATE SODIUM 100 MG PO CAPS
200.0000 mg | ORAL_CAPSULE | Freq: Every day | ORAL | Status: DC
  Administered 2018-11-15 – 2018-11-16 (×2): 200 mg via ORAL
  Filled 2018-11-15 (×2): qty 2

## 2018-11-15 MED ORDER — MOVING RIGHT ALONG BOOK
Freq: Once | Status: AC
  Administered 2018-11-15: 15:00:00

## 2018-11-15 MED FILL — Heparin Sodium (Porcine) Inj 1000 Unit/ML: INTRAMUSCULAR | Qty: 30 | Status: AC

## 2018-11-15 MED FILL — Magnesium Sulfate Inj 50%: INTRAMUSCULAR | Qty: 10 | Status: AC

## 2018-11-15 MED FILL — Potassium Chloride Inj 2 mEq/ML: INTRAVENOUS | Qty: 40 | Status: AC

## 2018-11-15 NOTE — Progress Notes (Signed)
Patient ID: Eric [name], male   DOB: [birth year], 58 y.o.   MRN: [id number] TCTS DAILY ICU PROGRESS NOTE                   [address]            [place] [address]          [phone]   5 Days Post-Op Procedure(s) (LRB): CORONARY ARTERY BYPASS GRAFTING (CABG) x Two , using left internal mammary artery and right leg greater saphenous vein harvested endoscopically - SVG to Ramus, LIMA to LAD (N/A) TRANSESOPHAGEAL ECHOCARDIOGRAM (TEE) (N/A)  Total Length of Stay:  LOS: 6 days   Subjective: Walking better , in sinus   Objective: Vital signs in last 24 hours: Temp:  [98.5 F (36.9 C)-99.1 F (37.3 C)] 98.7 F (37.1 C) (03/16 0707) Pulse Rate:  [72-83] 76 (03/16 1000) Cardiac Rhythm: Normal sinus rhythm (03/16 0800) Resp:  [10-23] 21 (03/16 1000) BP: (90-133)/(56-87) 133/87 (03/16 1000) SpO2:  [77 %-100 %] 97 % (03/16 1000) Weight:  [97.3 kg] 97.3 kg (03/16 0300)  Filed Weights   11/13/18 0600 11/14/18 0600 11/15/18 0300  Weight: 94.8 kg 96.7 kg 97.3 kg    Weight change: 0.6 kg   Hemodynamic parameters for last 24 hours:    Intake/Output from previous day: 03/15 0701 - 03/16 0700 In: 1401.3 [P.O.:1080; I.V.:321.3] Out: 601 [Urine:600; Stool:1]  Intake/Output this shift: Total I/O In: 493.3 [P.O.:480; I.V.:13.3] Out: -   Current Meds: Scheduled Meds: . acetaminophen  1,000 mg Oral Q6H  . allopurinol  300 mg Oral Daily  . amiodarone  400 mg Oral BID  . aspirin EC  325 mg Oral Daily  . atorvastatin  80 mg Oral q1800  . bisacodyl  10 mg Oral Daily  . Chlorhexidine Gluconate Cloth  6 each Topical Daily  . Chlorhexidine Gluconate Cloth  6 each Topical Daily  . docusate sodium  200 mg Oral Daily  . enoxaparin (LOVENOX) injection  40 mg Subcutaneous Q24H  . gabapentin  800 mg Oral TID  . glipiZIDE  5 mg Oral Daily  . insulin aspart  0-20 Units Subcutaneous TID WC  . insulin aspart  0-5 Units Subcutaneous QHS  . insulin aspart  5 Units  Subcutaneous TID WC  . insulin detemir  25 Units Subcutaneous BID  . mouth rinse  15 mL Mouth Rinse BID  . metFORMIN  1,000 mg Oral BID WC  . metoprolol tartrate  12.5 mg Oral BID  . multivitamin with minerals  1 tablet Oral Once per day on Mon Thu  . mupirocin ointment  1 application Nasal BID  . nicotine  14 mg Transdermal Daily  . pantoprazole  40 mg Oral Daily  . sodium chloride flush  10-40 mL Intracatheter Q12H  . sodium chloride flush  3 mL Intravenous Q12H   Continuous Infusions: . sodium chloride    . DOPamine Stopped (11/14/18 2000)  . lactated ringers    . lactated ringers Stopped (11/15/18 0720)  . phenylephrine (NEO-SYNEPHRINE) Adult infusion Stopped (11/11/18 1333)   PRN Meds:.cyclobenzaprine, fluticasone, metoprolol tartrate, morphine injection, ondansetron (ZOFRAN) IV, oxyCODONE, sodium chloride flush, sodium chloride flush, traMADol  General appearance: alert and cooperative Neurologic: intact Heart: regular rate and rhythm, S1, S2 normal, no murmur, click, rub or gallop Lungs: diminished breath sounds bibasilar Abdomen: soft, non-tender; bowel sounds normal; no masses,  no organomegaly Extremities: extremities normal, atraumatic, no cyanosis or edema and Homans sign is negative, no  sign of DVT Wound: sternum stable   Lab Results: CBC: Recent Labs    11/14/18 0430 11/15/18 0450  WBC 11.5* 8.4  HGB 10.9* 10.1*  HCT 32.9* 30.7*  PLT 149* 170   BMET:  Recent Labs    11/14/18 0430 11/15/18 0450  NA 134* 135  K 3.9 3.9  CL 100 102  CO2 28 28  GLUCOSE 124* 102*  BUN 23* 18  CREATININE 1.11 1.07  CALCIUM 8.0* 7.9*    CMET: Lab Results  Component Value Date   WBC 8.4 11/15/2018   HGB 10.1 (L) 11/15/2018   HCT 30.7 (L) 11/15/2018   PLT 170 11/15/2018   GLUCOSE 102 (H) 11/15/2018   CHOL 228 (H) 11/05/2018   TRIG 587 (HH) 11/05/2018   HDL 33 (L) 11/05/2018   LDLCALC Comment 11/05/2018   ALT 44 11/09/2018   AST 40 11/09/2018   NA 135 11/15/2018    K 3.9 11/15/2018   CL 102 11/15/2018   CREATININE 1.07 11/15/2018   BUN 18 11/15/2018   CO2 28 11/15/2018   INR 1.2 11/10/2018   HGBA1C 11.8 (H) 11/10/2018      PT/INR: No results for input(s): LABPROT, INR in the last 72 hours. Radiology: Dg Chest Port 1 View  Result Date: 11/15/2018 CLINICAL DATA:  Chest pain EXAM: PORTABLE CHEST 1 VIEW COMPARISON:  11/14/2018 FINDINGS: Right jugular sheath is again noted. Cardiomegaly is again seen. Postsurgical changes are again noted. The lungs are hypoinflated. Persistent left basilar opacity and effusion is noted. No new focal infiltrate is noted. No pneumothorax is seen. IMPRESSION: Stable changes in the left base. Electronically Signed   By: Alcide Clever M.D.   On: 11/15/2018 07:31     Assessment/Plan: S/P Procedure(s) (LRB): CORONARY ARTERY BYPASS GRAFTING (CABG) x Two , using left internal mammary artery and right leg greater saphenous vein harvested endoscopically - SVG to Ramus, LIMA to LAD (N/A) TRANSESOPHAGEAL ECHOCARDIOGRAM (TEE) (N/A) Mobilize Diuresis Diabetes control Plan for transfer to step-down: see transfer orders     Delight Ovens 11/15/2018 11:03 AM

## 2018-11-15 NOTE — Progress Notes (Signed)
Pt received from 2H. Oriented to room and equipment. VSS. Telemetry applied, CCMD notified x2. CHG completed. Call bell within reach, will continue to monitor.   Leonidas Romberg, RN

## 2018-11-15 NOTE — Telephone Encounter (Signed)
Currently admitted.

## 2018-11-15 NOTE — Care Management (Signed)
Benefits check for farxiga and jardiance sent and pending.   Colleen Can RN, BSN, NCM-BC, ACM-RN [phone]

## 2018-11-15 NOTE — TOC Benefit Eligibility Note (Signed)
Transition of Care [facility]) Benefit Eligibility Note    Patient Details  Name: Eric [name] MRN: [id number] Date of Birth: [birth year]   Medication/Dose: Gasper Lloyd AND jardiance  Covered?: Yes     Prescription Coverage Preferred Pharmacy: any Pharmacy of choice  Spoke with Person/Company/Phone Number:: Mylinda Latina [employer] Tenna Child [phone]  Co-Pay: Zero  Prior Approval: No     Additional Notes: No "mg."  given on medication    Renie Ora Phone Number: 11/15/2018, 11:34 AM

## 2018-11-15 NOTE — Progress Notes (Signed)
Inpatient Diabetes Program Recommendations  AACE/ADA: New Consensus Statement on Inpatient Glycemic Control (2015)  Target Ranges:  Prepandial:   less than 140 mg/dL      Peak postprandial:   less than 180 mg/dL (1-2 hours)      Critically ill patients:  140 - 180 mg/dL   Lab Results  Component Value Date   GLUCAP 177 (H) 11/15/2018   HGBA1C 11.8 (H) 11/10/2018    Review of Glycemic Control Results for Eric [name], Eric [name] (MRN [id number]) as of 11/15/2018 12:32  Ref. Range 11/15/2018 03:57 11/15/2018 07:04 11/15/2018 12:21  Glucose-Capillary Latest Ref Range: 70 - 99 mg/dL 323 (H) 92 557 (H)   Diabetes history: DM 2  Outpatient Diabetes medications: Glucotrol 5 mg daily; Humalog 75/25 mix 30 units in am / 20 units pm; Metformin 1000 mg BID  Current orders for Inpatient glycemic control: Levemir 25 units BID, Metformin 1000 mg BID, Glipizide 5 mg QD, Novolog 0-5 units QHS, Novolog 0-20 units TID, Novolog 5 units TID  Inpatient Diabetes Program Recommendations:    Noted patient experienced hypoglycemia on 3/15.  Consider decreasing correction to Novolog 0-15 units TID and Novolog 4 units TID.   Thanks, Lujean Rave, MSN, RNC-OB Diabetes Coordinator [phone] (8a-5p)

## 2018-11-15 NOTE — Progress Notes (Signed)
RT set up CPAP with patient's nasal pillow mask and circuit, and 2L O2 bled into circuit. Patient stated he will place himself on CPAP when he is ready for bed. RT instructed patient to have RT called if he needs assistance. RT will monitor as needed.

## 2018-11-15 NOTE — Progress Notes (Signed)
Progress Note  Patient Name: Jeralene PetersKelly Chiappetta Date of Encounter: 11/15/2018  Primary Cardiologist: Thurmon FairMihai Maha Fischel, MD   Subjective   Feels well. Wants to "get going".  Inpatient Medications    Scheduled Meds: . acetaminophen  1,000 mg Oral Q6H  . allopurinol  300 mg Oral Daily  . amiodarone  400 mg Oral BID  . aspirin EC  325 mg Oral Daily  . atorvastatin  80 mg Oral q1800  . bisacodyl  10 mg Oral Daily  . Chlorhexidine Gluconate Cloth  6 each Topical Daily  . Chlorhexidine Gluconate Cloth  6 each Topical Daily  . docusate sodium  200 mg Oral Daily  . enoxaparin (LOVENOX) injection  40 mg Subcutaneous Q24H  . gabapentin  800 mg Oral TID  . glipiZIDE  5 mg Oral Daily  . insulin aspart  0-20 Units Subcutaneous TID WC  . insulin aspart  0-5 Units Subcutaneous QHS  . insulin aspart  5 Units Subcutaneous TID WC  . insulin detemir  25 Units Subcutaneous BID  . mouth rinse  15 mL Mouth Rinse BID  . metFORMIN  1,000 mg Oral BID WC  . metoprolol tartrate  12.5 mg Oral BID  . multivitamin with minerals  1 tablet Oral Once per day on Mon Thu  . mupirocin ointment  1 application Nasal BID  . nicotine  14 mg Transdermal Daily  . pantoprazole  40 mg Oral Daily  . sodium chloride flush  10-40 mL Intracatheter Q12H  . sodium chloride flush  3 mL Intravenous Q12H   Continuous Infusions: . sodium chloride    . DOPamine Stopped (11/14/18 2000)  . lactated ringers    . lactated ringers 10 mL/hr at 11/15/18 0600  . phenylephrine (NEO-SYNEPHRINE) Adult infusion Stopped (11/11/18 1333)   PRN Meds: cyclobenzaprine, fluticasone, metoprolol tartrate, morphine injection, ondansetron (ZOFRAN) IV, oxyCODONE, sodium chloride flush, sodium chloride flush, traMADol   Vital Signs    Vitals:   11/15/18 0600 11/15/18 0700 11/15/18 0707 11/15/18 0800  BP: 118/82 112/84    Pulse: 72 73  79  Resp: 13 10  18   Temp:   98.7 F (37.1 C)   TempSrc:   Oral   SpO2: 97% 95%  97%  Weight:      Height:         Intake/Output Summary (Last 24 hours) at 11/15/2018 0840 Last data filed at 11/15/2018 0800 Gross per 24 hour  Intake 1370.45 ml  Output 601 ml  Net 769.45 ml   Last 3 Weights 11/15/2018 11/14/2018 11/13/2018  Weight (lbs) 214 lb 8.1 oz 213 lb 3 oz 208 lb 15.9 oz  Weight (kg) 97.3 kg 96.7 kg 94.8 kg      Telemetry    No new AFib in last 24 h - Personally Reviewed  ECG    No new tracibg - Personally Reviewed  Physical Exam  Obese, looks comfortable GEN: No acute distress.   Neck: No JVD Cardiac: RRR, no murmurs, rubs, or gallops.  Respiratory: Clear to auscultation bilaterally. GI: Soft, nontender, non-distended  MS: No edema; No deformity. Neuro:  Nonfocal  Psych: Normal affect   Labs    Chemistry Recent Labs  Lab 11/09/18 2238  11/13/18 0500 11/14/18 0430 11/15/18 0450  NA 134*   < > 133* 134* 135  K 3.8   < > 4.2 3.9 3.9  CL 103   < > 99 100 102  CO2 24   < > 25 28 28   GLUCOSE 288*   < >  315* 124* 102*  BUN 16   < > 24* 23* 18  CREATININE 1.01   < > 1.35* 1.11 1.07  CALCIUM 8.2*   < > 8.0* 8.0* 7.9*  PROT 6.0*  --   --   --   --   ALBUMIN 2.9*  --   --   --   --   AST 40  --   --   --   --   ALT 44  --   --   --   --   ALKPHOS 57  --   --   --   --   BILITOT 0.9  --   --   --   --   GFRNONAA >60   < > 58* >60 >60  GFRAA >60   < > >60 >60 >60  ANIONGAP 7   < > 9 6 5    < > = values in this interval not displayed.     Hematology Recent Labs  Lab 11/13/18 0500 11/14/18 0430 11/15/18 0450  WBC 12.6* 11.5* 8.4  RBC 3.93* 3.68* 3.41*  HGB 11.4* 10.9* 10.1*  HCT 35.3* 32.9* 30.7*  MCV 89.8 89.4 90.0  MCH 29.0 29.6 29.6  MCHC 32.3 33.1 32.9  RDW 12.8 12.8 12.7  PLT 126* 149* 170    Cardiac EnzymesNo results for input(s): TROPONINI in the last 168 hours. No results for input(s): TROPIPOC in the last 168 hours.   BNPNo results for input(s): BNP, PROBNP in the last 168 hours.   DDimer No results for input(s): DDIMER in the last 168 hours.    Radiology    Dg Chest Port 1 View  Result Date: 11/15/2018 CLINICAL DATA:  Chest pain EXAM: PORTABLE CHEST 1 VIEW COMPARISON:  11/14/2018 FINDINGS: Right jugular sheath is again noted. Cardiomegaly is again seen. Postsurgical changes are again noted. The lungs are hypoinflated. Persistent left basilar opacity and effusion is noted. No new focal infiltrate is noted. No pneumothorax is seen. IMPRESSION: Stable changes in the left base. Electronically Signed   By: Alcide Clever M.D.   On: 11/15/2018 07:31   Dg Chest Port 1 View  Result Date: 11/14/2018 CLINICAL DATA:  Status post CABG EXAM: PORTABLE CHEST 1 VIEW COMPARISON:  11/13/2018 FINDINGS: Mild lingular/left lower lobe atelectasis. Small left pleural effusion. Mild pulmonary vascular congestion.  No frank interstitial edema. The heart is top-normal in size. Postsurgical changes related to prior CABG. Stable right IJ venous sheath. Median sternotomy. IMPRESSION: Mild lingular/left lower lobe atelectasis with small left pleural effusion. Stable postsurgical changes related to prior CABG. Electronically Signed   By: Charline Bills M.D.   On: 11/14/2018 07:02    Cardiac Studies  10/14/2018 ECGHO  1. The left ventricle has normal systolic function with an ejection fraction of 60-65%. The cavity size was normal. Left ventricular diastolic Doppler parameters are consistent with impaired relaxation No evidence of left ventricular regional wall  motion abnormalities.  2. The mitral valve is normal in structure. There is mild calcification. No evidence of mitral valve stenosis. No regurgitation.  3. The tricuspid valve is normal in structure.  4. The aortic valve is tricuspid There is mild calcification of the aortic valve. No stenosis.  5. The pulmonic valve was normal in structure.  6. The aortic root is normal in size and structure.  7. There is mild dilatation of the ascending aorta measuring 39 mm.  8. No evidence of left ventricular  regional wall motion abnormalities.  9. Right  atrial pressure is estimated at 3 mmHg. 10. No complete TR doppler jet so unable to estimate PA systolic pressure.  11/09/2018 Cardiac cath  Ost Ramus to Ramus lesion is 70% stenosed.  Dist LM to Ost LAD lesion is 85% stenosed.   Significant ostial 85% LAD stenosis at a sharp angle arising from the left main with 70% proximal optional diagonal stenosis; normal left circumflex and normal large dominant RCA.  Low normal global LV function with mild anterolateral hypocontractility with an EF of approximately 50 to 55%.  LVEDP 8 mmHg.  RECOMMENDATION: Surgical consult for CABG revascularization surgery.  Will change lovastatin to atorvastatin 80 mg.   11/10/2018 CABG OP Note  SURGICAL PROCEDURE:  Coronary artery bypass grafting x2 with the left internal mammary to the left anterior descending coronary artery, reverse saphenous vein graft to the intermediate coronary artery with right thigh greater saphenous endoscopic vein  harvesting.  Patient Profile     58 y.o. male with poorly controlled DM complicated by polyneuropathy, mixed hyperlipidemia and history of TIA, presented with marked exertional dyspnea and was found to have severe proximal LAD/ramus intermedius disease, now postop day #5 after CABG x2, complicated by transient paroxysmal atrial fibrillation.  Assessment & Plan    1. PAFib: relatively brief. Now on oral amiodarone. If recurs during this hospital stay will initiate DOAC. Other wise plan PO amiodarone for about 4 weeks. 2. CAD s/p CABG: making good progress. Still has TPW and R IJ CVC in place. 3. DM: seen by diabetes coordinator. Needs much more aggressive control. Good candidate for SGLT2 inhibitor. Will start invokana and ask case mgr to evaluate insurance coverage for farxiga or jardiance.     For questions or updates, please contact [facility] Please consult [email or web address] for contact info under         Signed, Thurmon Fair, MD  11/15/2018, 8:40 AM

## 2018-11-15 NOTE — Op Note (Signed)
NAME: Eric [name], Eric [name] MEDICAL RECORD [id number] ACCOUNT [id number] DATE OF BIRTH:01/16/1961 FACILITY: [facility] LOCATION: [facility] PHYSICIAN:Stepheny Canal Bari Allanah Mcfarland, MD  OPERATIVE REPORT  DATE OF PROCEDURE:  11/10/2018  PREOPERATIVE DIAGNOSIS:  Symptomatic coronary occlusive disease with unstable angina.  POSTOPERATIVE DIAGNOSIS:  Symptomatic coronary occlusive disease with unstable angina.  SURGICAL PROCEDURE:  Coronary artery bypass grafting x2 with the left internal mammary to the left anterior descending coronary artery, reverse saphenous vein graft to the intermediate coronary artery with right thigh greater saphenous endoscopic vein  harvesting.  SURGEON:  Sheliah Plane, MD  FIRST ASSISTANT:  Doree Fudge, PA.  BRIEF HISTORY:  The patient is a 57 year old severely diabetic male that has been poorly controlled with hemoglobin A1c of over 12.  For the last 6 months, he has had increasing episodes of chest discomfort, shortness of breath.  He had some cardiac  evaluation including stress test in the past; however, because of persistent symptoms, he returned to see Dr. Royann Shivers and further evaluation including cardiac catheterization was done.    At the time of cardiac catheterization his right coronary artery was dominant vessel, but without significant disease and complex LAD and intermediate disease of greater than 80%.  The circumflex was a relatively small vessel, but without significant  disease.  Because of his significant overall ventricular function was mildly to moderately decreased with ejection fraction of 40%.  With the patient's complex LAD and intermediate disease and symptoms, coronary artery bypass grafting was recommended to  the patient who agreed and signed informed consent.  DESCRIPTION OF PROCEDURE:  With Swan-Ganz and arterial line monitors in place, the patient underwent general endotracheal anesthesia without incident.  Skin the chest and legs was prepped with  Betadine, draped in the usual sterile manner.  TEE probe was  placed and dictated under a separate note.  Overall, the patient had no wall motion abnormalities.  Global hypokinesis with ejection fraction of approximately 40%.  There was no obvious valvular disease.  Appropriate timeout was performed and we then  proceeded with endoscopic vein harvesting of right greater saphenous vein from the thigh.  Median sternotomy was performed.  The left internal mammary artery was dissected down as a pedicle graft.  The distal artery was divided and had good free flow.   Pericardium was opened.  Overall, ventricular function was as noted.  There was no obvious myocardial scar.  The patient was systemically heparinized.  The ascending aorta was cannulated.  The right atrium was cannulated.  Aortic root vent cardioplegia  needle was introduced into the ascending aorta.  The patient was placed on cardiopulmonary bypass, 2.4 liters per minute per meter square.  Sites anastomosis were selected and dissected at the epicardium.  The patient's body temperature was cooled to 32  degrees.  Aortic crossclamp was applied and 600 mL of cold blood potassium cardioplegia was administered with diastolic arrest of the heart.  The heart was elevated and the intermediate vessel along the lateral wall of the heart was dissected out from  its inferior myocardial position.  The vessel was relatively small, 1.2 to 1.3 mm in size and very thin-walled.  Using a segment of reverse saphenous vein graft, a distal anastomosis was performed with a running 8-0 Prolene  procedure was administered  down the vein graft.  We then turned our attention to the left anterior descending coronary artery.  Between the mid and distal third of the vessel the LAD was opened, admitted a 1 mm probe proximally and distally.  The  vessel was approximately 1.3-1.4  mm in size.  Using a running 8-0 Prolene, the left internal mammary artery was anastomosed to left  anterior descending coronary artery.  With cross clamp still in place, the vein graft was anastomosed to the ascending aorta after a single punch aortotomy  was created.  The bulldog on the mammary artery was removed with prompt rise in myocardial septal temperature.  The heart was allowed to passively fill and deair and the proximal anastomosis was completed.  The crossclamp was removed with total  crossclamp time of 49 minutes.  The patient required electrical defibrillation turned to a sinus rhythm.  He was actually paced increased rate.  Sites anastomosis were inspected.  He was then rewarmed to 37 degrees.  He was ventilated and weaned from  cardiopulmonary bypass on low low-dose milrinone and dopamine.  He remained hemodynamically stable.  He was decannulated in the usual fashion.  Protamine sulfate was administered.  With operative field hemostatic,  atrial and ventricular pacing wires had  been applied.  A graft marker was applied.  A left pleural tube and a Blake mediastinal drain were left in place.  Pericardium was loosely reapproximated.  Sternum was closed with 6 stainless steel wire.  Fascia was closed with interrupted 0 Vicryl,  running 3-0 Vicryl, subcutaneous tissue, 3-0 subcuticular stitch in skin edges.  Dry dressings were applied.  Sponge and needle count was reported as correct at the completion of the procedure.    Total pump time was 71 minutes.  RF scanning showed clear code.    The patient did not require any blood bank blood products during the operative procedure.  AN/NUANCE  D:11/15/2018 T:11/15/2018 JOB:[id number]

## 2018-11-15 NOTE — Progress Notes (Signed)
Right side Introducer removed without patient distress, Without ectopy. Sterile technique utilized. Vaseline gauze and sterile 4 x 4's placed. Manual pressure held x 5 minutes, No bleeding noted. No hematoma formation, Site soft, clean, dry and absent of drainage. Patient denies SOB, pain, question or concern.

## 2018-11-16 ENCOUNTER — Inpatient Hospital Stay (HOSPITAL_COMMUNITY): Payer: Medicare Other

## 2018-11-16 LAB — CBC
HCT: 34.5 % — ABNORMAL LOW (ref 39.0–52.0)
Hemoglobin: 11.3 g/dL — ABNORMAL LOW (ref 13.0–17.0)
MCH: 29.3 pg (ref 26.0–34.0)
MCHC: 32.8 g/dL (ref 30.0–36.0)
MCV: 89.4 fL (ref 80.0–100.0)
Platelets: 191 10*3/uL (ref 150–400)
RBC: 3.86 MIL/uL — ABNORMAL LOW (ref 4.22–5.81)
RDW: 12.8 % (ref 11.5–15.5)
WBC: 10.7 10*3/uL — ABNORMAL HIGH (ref 4.0–10.5)
nRBC: 0 % (ref 0.0–0.2)

## 2018-11-16 LAB — BASIC METABOLIC PANEL
Anion gap: 10 (ref 5–15)
BUN: 18 mg/dL (ref 6–20)
CO2: 27 mmol/L (ref 22–32)
Calcium: 8.3 mg/dL — ABNORMAL LOW (ref 8.9–10.3)
Chloride: 97 mmol/L — ABNORMAL LOW (ref 98–111)
Creatinine, Ser: 1.23 mg/dL (ref 0.61–1.24)
GFR calc Af Amer: >60 mL/min (ref >60)
GFR calc non Af Amer: >60 mL/min (ref >60)
Glucose, Bld: 142 mg/dL — ABNORMAL HIGH (ref 70–99)
Potassium: 3.8 mmol/L (ref 3.5–5.1)
Sodium: 134 mmol/L — ABNORMAL LOW (ref 135–145)

## 2018-11-16 LAB — GLUCOSE, CAPILLARY
Glucose-Capillary: 137 mg/dL — ABNORMAL HIGH (ref 70–99)
Glucose-Capillary: 181 mg/dL — ABNORMAL HIGH (ref 70–99)
Glucose-Capillary: 189 mg/dL — ABNORMAL HIGH (ref 70–99)
Glucose-Capillary: 138 mg/dL — ABNORMAL HIGH (ref 70–99)

## 2018-11-16 MED ORDER — POTASSIUM CHLORIDE CRYS ER 20 MEQ PO TBCR
30.0000 meq | EXTENDED_RELEASE_TABLET | Freq: Once | ORAL | Status: AC
  Administered 2018-11-16: 30 meq via ORAL
  Filled 2018-11-16: qty 1

## 2018-11-16 MED ORDER — LACTULOSE 10 GM/15ML PO SOLN
20.0000 g | Freq: Once | ORAL | Status: AC
  Administered 2018-11-16: 20 g via ORAL
  Filled 2018-11-16: qty 30

## 2018-11-16 NOTE — Progress Notes (Addendum)
      [address]       [place] [address]             [phone]        6 Days Post-Op Procedure(s) (LRB): CORONARY ARTERY BYPASS GRAFTING (CABG) x Two , using left internal mammary artery and right leg greater saphenous vein harvested endoscopically - SVG to Ramus, LIMA to LAD (N/A) TRANSESOPHAGEAL ECHOCARDIOGRAM (TEE) (N/A)  Subjective: Patient states passing a lot of gas, but no bowel movement yet.  Objective: Vital signs in last 24 hours: Temp:  [98.1 F (36.7 C)-99.7 F (37.6 C)] 99.7 F (37.6 C) (03/17 0605) Pulse Rate:  [71-79] 77 (03/17 0605) Cardiac Rhythm: Normal sinus rhythm (03/16 1900) Resp:  [13-21] 17 (03/17 0027) BP: (103-133)/(64-87) 108/64 (03/17 0605) SpO2:  [90 %-97 %] 90 % (03/17 0027) Weight:  [96.3 kg] 96.3 kg (03/17 0605)  Pre op weight 89.1 kg Current Weight  11/16/18 96.3 kg      Intake/Output from previous day: 03/16 0701 - 03/17 0700 In: 853.3 [P.O.:840; I.V.:13.3] Out: -    Physical Exam:  Cardiovascular: RRR Pulmonary: Diminished at bases Abdomen: Soft, non tender, bowel sounds present. Extremities: Mild bilateral lower extremity edema. Wounds: Clean and dry.  No erythema or signs of infection.  Lab Results: CBC: Recent Labs    11/15/18 0450 11/16/18 0227  WBC 8.4 10.7*  HGB 10.1* 11.3*  HCT 30.7* 34.5*  PLT 170 191   BMET:  Recent Labs    11/15/18 0450 11/16/18 0227  NA 135 134*  K 3.9 3.8  CL 102 97*  CO2 28 27  GLUCOSE 102* 142*  BUN 18 18  CREATININE 1.07 1.23  CALCIUM 7.9* 8.3*    PT/INR:  Lab Results  Component Value Date   INR 1.2 11/10/2018   INR 1.0 11/09/2018   ABG:  INR: Will add last result for INR, ABG once components are confirmed Will add last 4 CBG results once components are confirmed  Assessment/Plan:  1. CV - Previous a fib with RVR. SR in the 70's. On Amiodarone 400 mg bid and Lopressor 12.5 mg bid. 2.  Pulmonary - On CPAP at night. Await this am's CXR.  Encourage incentive spirometer. 3. Volume Overload - On Lasix 40 mg daily 4.  Acute blood loss anemia - H and H increased to 11.3 and 34.5 5. Supplement potassium 6. DM-CBGs 87/124/137. On Metformin 1000 mg bid, Glipizide 5 mg daily, and Insulin. Pre op HGA1C 11.8. 7. Remove EPW 8. LOC constipation 9. Hopefully, home 1-2 days  Eric Huh ZimmermanPA-C 11/16/2018,7:19 AM [phone] Home 1-2 days  I have seen and examined Eric [name] and agree with the above assessment  and plan.  Delight Ovens MD Beeper [phone] Office [phone] 11/16/2018 7:57 PM

## 2018-11-16 NOTE — Telephone Encounter (Signed)
CURRENTLY ADMITTED

## 2018-11-16 NOTE — Progress Notes (Signed)
OT Cancellation Note  Patient Details Name: Eric [name] MRN: [id number] DOB: [birth year]   Cancelled Treatment:    Reason Eval/Treat Not Completed: Other (comment)(EPW removed. ) Will attempt to return later today.   Thornell Mule, OT/L   Acute OT Clinical Specialist Acute Rehabilitation Services Pager [phone] Office [phone]  11/16/2018, 11:28 AM

## 2018-11-16 NOTE — Progress Notes (Signed)
Patient refused CPAP for the night  

## 2018-11-16 NOTE — Progress Notes (Signed)
OT Treatment Note  Pt impulsive during session with poor recall of sternal precautions. Will continue to follow for education regarding compensatory strategies for ADL and further assess need for S with medication management after DC.     11/16/18 1600  OT Visit Information  Last OT Received On 11/16/18  Assistance Needed +1  PT/OT/SLP Co-Evaluation/Treatment Yes  Reason for Co-Treatment To address functional/ADL transfers  OT goals addressed during session ADL's and self-care  History of Present Illness Pt adm with chest pain. 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 PMH - uncontrolled DM, neuropathy, TIA, fatty liver  Precautions  Precautions Fall;Sternal  Precaution Booklet Issued No  Precaution Comments verbally reinforced; pt unable to verbalize sternal precautions  Pain Assessment  Pain Assessment Faces  Faces Pain Scale 2  Pain Location incisional  Pain Descriptors / Indicators Operative site guarding  Pain Intervention(s) Limited activity within patient's tolerance  Cognition  Arousal/Alertness Awake/alert  Behavior During Therapy Impulsive  Overall Cognitive Status Impaired/Different from baseline  Area of Impairment Attention;Memory;Following commands;Safety/judgement;Awareness;Problem solving  Current Attention Level Selective  Memory Decreased recall of precautions;Decreased short-term memory  Following Commands Follows multi-step commands inconsistently  Safety/Judgement Decreased awareness of safety;Decreased awareness of deficits  Awareness Emergent  Problem Solving Slow processing  General Comments requires frequent reminders for precautions despite verbal/visual demonstration  Upper Extremity Assessment  Upper Extremity Assessment Generalized weakness  Lower Extremity Assessment  Lower Extremity Assessment Defer to PT evaluation  ADL  Overall ADL's  Needs assistance/impaired  Grooming Minimal assistance;Sitting  Upper Body Bathing Minimal assistance;Sitting  Toilet  Transfer Min guard;RW  Functional mobility during ADLs Minimal assistance (rollator; vc for positioning)  Bed Mobility  Overal bed mobility Needs Assistance  Bed Mobility Sit to Sidelying;Rolling  Sit to sidelying Min guard  General bed mobility comments vc for correct tecnique to adhere to sternal precautions  Balance  Overall balance assessment Needs assistance  Sitting-balance support Feet supported  Sitting balance-Leahy Scale Good  Standing balance support Bilateral upper extremity supported  Standing balance-Leahy Scale Poor  Standing balance comment reliance on UE support  Restrictions  Other Position/Activity Restrictions Sternal precautions  Transfers  Overall transfer level Needs assistance  Equipment used None  Transfers Sit to/from Stand  Sit to Stand Min guard  General transfer comment Good power up to stands but requires multimodal cues to enforce hand placement to maintain sternal precautions  OT - End of Session  Equipment Utilized During Treatment Gait belt;Other (comment) (rollator)  Activity Tolerance Patient tolerated treatment well  Patient left in bed;with call bell/phone within reach;with bed alarm set  Nurse Communication Mobility status  OT Assessment/Plan  OT Plan Discharge plan remains appropriate  OT Visit Diagnosis Other abnormalities of gait and mobility (R26.89);Muscle weakness (generalized) (M62.81);Other symptoms and signs involving cognitive function  OT Frequency (ACUTE ONLY) Min 2X/week  Follow Up Recommendations Home health OT;Supervision/Assistance - 24 hour  OT Equipment 3 in 1 bedside commode  AM-PAC OT "6 Clicks" Daily Activity Outcome Measure (Version 2)  Help from another person eating meals? 4  Help from another person taking care of personal grooming? 3  Help from another person toileting, which includes using toliet, bedpan, or urinal? 3  Help from another person bathing (including washing, rinsing, drying)? 3  Help from another  person to put on and taking off regular upper body clothing? 2  Help from another person to put on and taking off regular lower body clothing? 2  6 Click Score 17  OT Goal Progression  Progress towards OT goals Progressing toward goals  Acute Rehab OT Goals  Patient Stated Goal Drive and watch TV  OT Goal Formulation With patient/family  Time For Goal Achievement 11/27/18  Potential to Achieve Goals Good  ADL Goals  Pt Will Perform Grooming with supervision;standing  Pt Will Perform Lower Body Dressing with min assist;sit to/from stand;with adaptive equipment  Pt Will Transfer to Toilet with supervision;ambulating;bedside commode;regular height toilet  Pt Will Perform Toileting - Clothing Manipulation and hygiene with supervision;with adaptive equipment;sit to/from stand  Additional ADL Goal #1 Pt will verbalize and implement all sternal precautions during dressing and bathing tasks.  OT Time Calculation  OT Start Time (ACUTE ONLY) 1332  OT Stop Time (ACUTE ONLY) 1358  OT Time Calculation (min) 26 min  OT General Charges  $OT Visit 1 Visit  OT Treatments  $Self Care/Home Management  8-22 mins  Luisa Dago, OT/L   Acute OT Clinical Specialist Acute Rehabilitation Services Pager [phone] Office [phone]

## 2018-11-16 NOTE — Care Management Important Message (Signed)
Important Message  Patient Details  Name: Eric [name] MRN: [id number] Date of Birth: 11-May-1961   Medicare Important Message Given:  Yes    Shatisha Falter P [place]ante 11/16/2018, 2:01 PM

## 2018-11-16 NOTE — Progress Notes (Signed)
Physical Therapy Treatment Patient Details Name: Eric [name] MRN: [id number] DOB: 12/30/1960 Today's Date: 11/16/2018    History of Present Illness Pt adm with chest pain. 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 PMH - uncontrolled DM, neuropathy, TIA, fatty liver    PT Comments    Pt progressing well towards his physical therapy goals, ambulating 380 feet with use of Rollator and min assist. HR 76-88 bpm. Pt continues to have difficulty with attention, awareness, and problem solving, requiring frequent, multimodal cueing for maintenance of sternal precautions. Will continue to reinforce education and progress mobility.      Follow Up Recommendations  Home health PT;Supervision/Assistance - 24 hour     Equipment Recommendations  Other (comment)(Rollator)    Recommendations for Other Services       Precautions / Restrictions Precautions Precautions: Fall;Sternal Precaution Booklet Issued: No Precaution Comments: verbally reinforced Restrictions Weight Bearing Restrictions: Yes Other Position/Activity Restrictions: Sternal precautions    Mobility  Bed Mobility Overal bed mobility: Needs Assistance Bed Mobility: Sit to Sidelying;Rolling Rolling: Modified independent (Device/Increase time) Sidelying to sit: Min guard       General bed mobility comments: Min guard for safety with step-by-step cues  Transfers Overall transfer level: Needs assistance Equipment used: None Transfers: Sit to/from Stand Sit to Stand: Min guard         General transfer comment: Good power up to stands but requires multimodal cues to enforce hand placement to maintain sternal precautions  Ambulation/Gait Ambulation/Gait assistance: Min assist Gait Distance (Feet): 380 Feet Assistive device: 4-wheeled walker(eva walker) Gait Pattern/deviations: Step-through pattern;Decreased stride length;Trunk flexed Gait velocity: decreased   General Gait Details: Max cues for upright posture, trunk/hip  extension, and pacing (pt with increased anterior momentum at times and has dififculty controlling it)   Optometrist    Modified Rankin (Stroke Patients Only)       Balance Overall balance assessment: Needs assistance Sitting-balance support: Feet supported Sitting balance-Leahy Scale: Good     Standing balance support: Bilateral upper extremity supported Standing balance-Leahy Scale: Poor Standing balance comment: reliance on UE support                            Cognition Arousal/Alertness: Awake/alert Behavior During Therapy: WFL for tasks assessed/performed;Restless Overall Cognitive Status: Impaired/Different from baseline Area of Impairment: Attention;Memory;Following commands;Safety/judgement;Awareness;Problem solving                   Current Attention Level: Alternating Memory: Decreased recall of precautions Following Commands: Follows multi-step commands inconsistently Safety/Judgement: Decreased awareness of safety;Decreased awareness of deficits Awareness: Emergent Problem Solving: Slow processing General Comments: requires frequent reminders for precautions despite verbal/visual demonstration      Exercises      General Comments        Pertinent Vitals/Pain Pain Assessment: Faces Faces Pain Scale: Hurts a little bit Pain Location: incisional Pain Descriptors / Indicators: Operative site guarding Pain Intervention(s): Monitored during session    Home Living                      Prior Function            PT Goals (current goals can now be found in the care plan section) Acute Rehab PT Goals Patient Stated Goal: Drive and watch TV Potential to Achieve Goals: Good Progress towards PT goals: Progressing toward goals  Frequency    Min 3X/week      PT Plan Current plan remains appropriate    Co-evaluation PT/OT/SLP Co-Evaluation/Treatment: Yes Reason for Co-Treatment:  Necessary to address cognition/behavior during functional activity PT goals addressed during session: Mobility/safety with mobility;Balance;Proper use of DME        AM-PAC PT "6 Clicks" Mobility   Outcome Measure  Help needed turning from your back to your side while in a flat bed without using bedrails?: None Help needed moving from lying on your back to sitting on the side of a flat bed without using bedrails?: A Little Help needed moving to and from a bed to a chair (including a wheelchair)?: A Little Help needed standing up from a chair using your arms (e.g., wheelchair or bedside chair)?: A Little Help needed to walk in hospital room?: A Little Help needed climbing 3-5 steps with a railing? : A Lot 6 Click Score: 18    End of Session Equipment Utilized During Treatment: Gait belt Activity Tolerance: Patient tolerated treatment well Patient left: in bed;with call bell/phone within reach Nurse Communication: Mobility status PT Visit Diagnosis: Unsteadiness on feet (R26.81);Other abnormalities of gait and mobility (R26.89);Muscle weakness (generalized) (M62.81)     Time: 7116-5790 PT Time Calculation (min) (ACUTE ONLY): 23 min  Charges:  $Gait Training: 8-22 mins                     Laurina Bustle, , DPT Acute Rehabilitation Services Pager [phone] Office [phone]   Vanetta Mulders 11/16/2018, 3:13 PM

## 2018-11-16 NOTE — Progress Notes (Signed)
Progress Note  Patient Name: Eric [name] Date of Encounter: 11/16/2018  Primary Cardiologist: Thurmon Fair, MD   Subjective   No recurrent atrial fibrillation. Walked a lot yesterday CVC out. Still has TPW.  Inpatient Medications    Scheduled Meds: . allopurinol  300 mg Oral Daily  . amiodarone  400 mg Oral BID  . atorvastatin  80 mg Oral q1800  . docusate sodium  200 mg Oral Daily  . enoxaparin (LOVENOX) injection  40 mg Subcutaneous Q24H  . furosemide  40 mg Oral Daily  . gabapentin  800 mg Oral TID  . glipiZIDE  5 mg Oral Daily  . insulin aspart  0-20 Units Subcutaneous TID WC  . insulin aspart  5 Units Subcutaneous TID WC  . insulin detemir  25 Units Subcutaneous BID  . mouth rinse  15 mL Mouth Rinse BID  . metFORMIN  1,000 mg Oral BID WC  . metoprolol tartrate  12.5 mg Oral BID  . multivitamin with minerals  1 tablet Oral Once per day on Mon Thu  . nicotine  14 mg Transdermal Daily  . pantoprazole  40 mg Oral QAC breakfast  . potassium chloride  10 mEq Oral Daily  . sodium chloride flush  3 mL Intravenous Q12H   Continuous Infusions: . sodium chloride     PRN Meds: sodium chloride, alum & mag hydroxide-simeth, bisacodyl **OR** bisacodyl, cyclobenzaprine, fluticasone, guaiFENesin, ondansetron **OR** ondansetron (ZOFRAN) IV, oxyCODONE, sodium chloride flush, traMADol   Vital Signs    Vitals:   11/16/18 0027 11/16/18 0605 11/16/18 0808 11/16/18 0835  BP: 116/76 108/64 120/76   Pulse: 77 77 79   Resp: 17     Temp: 98.1 F (36.7 C) 99.7 F (37.6 C) 99.3 F (37.4 C)   TempSrc: Oral Oral Oral   SpO2: 90%  (!) 86% 90%  Weight:  96.3 kg    Height:        Intake/Output Summary (Last 24 hours) at 11/16/2018 1027 Last data filed at 11/15/2018 2200 Gross per 24 hour  Intake 360 ml  Output -  Net 360 ml   Last 3 Weights 11/16/2018 11/15/2018 11/14/2018  Weight (lbs) 212 lb 6.4 oz 214 lb 8.1 oz 213 lb 3 oz  Weight (kg) 96.344 kg 97.3 kg 96.7 kg       Telemetry    NSR - Personally Reviewed  ECG    No new ECG - Personally Reviewed  Physical Exam  Obese. Wounds healing well. GEN: No acute distress.   Neck: No JVD Cardiac: RRR, no murmurs, rubs, or gallops.  Respiratory: Clear to auscultation bilaterally. GI: Soft, nontender, non-distended  MS: No edema; No deformity. Neuro:  Nonfocal  Psych: Normal affect   Labs    Chemistry Recent Labs  Lab 11/09/18 2238  11/14/18 0430 11/15/18 0450 11/16/18 0227  NA 134*   < > 134* 135 134*  K 3.8   < > 3.9 3.9 3.8  CL 103   < > 100 102 97*  CO2 24   < > 28 28 27   GLUCOSE 288*   < > 124* 102* 142*  BUN 16   < > 23* 18 18  CREATININE 1.01   < > 1.11 1.07 1.23  CALCIUM 8.2*   < > 8.0* 7.9* 8.3*  PROT 6.0*  --   --   --   --   ALBUMIN 2.9*  --   --   --   --   AST 40  --   --   --   --  ALT 44  --   --   --   --   ALKPHOS 57  --   --   --   --   BILITOT 0.9  --   --   --   --   GFRNONAA >60   < > >60 >60 >60  GFRAA >60   < > >60 >60 >60  ANIONGAP 7   < > 6 5 10    < > = values in this interval not displayed.     Hematology Recent Labs  Lab 11/14/18 0430 11/15/18 0450 11/16/18 0227  WBC 11.5* 8.4 10.7*  RBC 3.68* 3.41* 3.86*  HGB 10.9* 10.1* 11.3*  HCT 32.9* 30.7* 34.5*  MCV 89.4 90.0 89.4  MCH 29.6 29.6 29.3  MCHC 33.1 32.9 32.8  RDW 12.8 12.7 12.8  PLT 149* 170 191    Cardiac EnzymesNo results for input(s): TROPONINI in the last 168 hours. No results for input(s): TROPIPOC in the last 168 hours.   BNPNo results for input(s): BNP, PROBNP in the last 168 hours.   DDimer No results for input(s): DDIMER in the last 168 hours.   Radiology    Dg Chest 2 View  Result Date: 11/16/2018 CLINICAL DATA:  Sore chest. EXAM: CHEST - 2 VIEW COMPARISON:  11/15/2018. FINDINGS: Right IJ sheath has been removed. Prior median sternotomy and CABG. Cardiomegaly with normal pulmonary vascularity. Mild bibasilar atelectasis/infiltrates. Small bilateral pleural effusions.  IMPRESSION: 1.  Right IJ sheath has been removed. 2.  Prior CABG.  Cardiomegaly with normal pulmonary vascularity. 3. Mild bibasilar atelectasis/infiltrates. Small bilateral pleural effusions. Findings stable from prior exam. Electronically Signed   By: Maisie Fus  Register   On: 11/16/2018 09:41   Dg Chest Port 1 View  Result Date: 11/15/2018 CLINICAL DATA:  Chest pain EXAM: PORTABLE CHEST 1 VIEW COMPARISON:  11/14/2018 FINDINGS: Right jugular sheath is again noted. Cardiomegaly is again seen. Postsurgical changes are again noted. The lungs are hypoinflated. Persistent left basilar opacity and effusion is noted. No new focal infiltrate is noted. No pneumothorax is seen. IMPRESSION: Stable changes in the left base. Electronically Signed   By: Alcide Clever M.D.   On: 11/15/2018 07:31    Cardiac Studies   10/14/2018 ECGHO 1. The left ventricle has normal systolic function with an ejection fraction of 60-65%. The cavity size was normal. Left ventricular diastolic Doppler parameters are consistent with impaired relaxation No evidence of left ventricular regional wall  motion abnormalities. 2. The mitral valve is normal in structure. There is mild calcification. No evidence of mitral valve stenosis. No regurgitation. 3. The tricuspid valve is normal in structure. 4. The aortic valve is tricuspid There is mild calcification of the aortic valve. No stenosis. 5. The pulmonic valve was normal in structure. 6. The aortic root is normal in size and structure. 7. There is mild dilatation of the ascending aorta measuring 39 mm. 8. No evidence of left ventricular regional wall motion abnormalities. 9. Right atrial pressure is estimated at 3 mmHg. 10. No complete TR doppler jet so unable to estimate PA systolic pressure.  11/09/2018 Cardiac cath  Ost Ramus to Ramus lesion is 70% stenosed.  Dist LM to Ost LAD lesion is 85% stenosed.  Significant ostial 85% LAD stenosis at a sharp angle arising  from the left main with 70% proximal optional diagonal stenosis; normal left circumflex and normal large dominant RCA.  Low normal global LV function with mild anterolateral hypocontractility with an EF of approximately 50 to 55%.  LVEDP 8 mmHg.  RECOMMENDATION: Surgical consult for CABG revascularization surgery. Will change lovastatin to atorvastatin 80 mg.   11/10/2018 CABG OP Note  SURGICAL PROCEDURE: Coronary artery bypass grafting x2 with the left internal mammary to the left anterior descending coronary artery, reverse saphenous vein graft to the intermediate coronary artery with right thigh greater saphenous endoscopic vein  harvesting.  Patient Profile     58 y.o. male with poorly controlled DM complicated by polyneuropathy, mixed hyperlipidemia and history of TIA, presented with marked exertional dyspnea and was found to have severe proximal LAD/ramus intermedius disease, now postop day #5 after CABG x2, complicated by transient paroxysmal atrial fibrillation.  Assessment & Plan    1. PAFib: relatively brief. Now on oral amiodarone. If recurs during this hospital stay will initiate DOAC. Other wise plan PO amiodarone for about 4 weeks. 2. CAD s/p CABG: making good progress. Still has TPW and R IJ CVC in place. 3. DM: seen by diabetes coordinator. Needs much more aggressive control. Good candidate for SGLT2 inhibitor.      [facility] will sign off.   Medication Recommendations:  At DC, reduce amiodarone to 400 mg daily for one week, then 200 mg daily for another 2 weeks, then stop if no arrhythmia recurrence. Metoprolol tartrate 12.5 mg twice daily. Atorvastatin 80 mg daily. Aspirin 325 mg daily for 3 months, then 81 mg daily. Other recommendations (labs, testing, etc):  n/a Follow up as an outpatient:   Will arrange f/u in 4 weeks.  For questions or updates, please contact [facility] Please consult [email or web address] for contact info under    Signed, Thurmon Fair, MD  11/16/2018, 10:27 AM

## 2018-11-16 NOTE — Progress Notes (Signed)
EPW's removed per MD order without difficulty.  Will continue to monitor. 

## 2018-11-16 NOTE — TOC Initial Note (Signed)
Transition of Care [facility]) - Initial/Assessment Note    Patient Details  Name: Eric [name] MRN: [id number] Date of Birth: August 09, 1961  [facility] CM/SW Contact:    Midge Minium RN, BSN, NCM-BC, ACM-RN [phone] Phone Number: 11/16/2018, 4:05 PM  Clinical Narrative:  58 yo male presented for chest pain; s/p CABG x 2 on 11/10/18. CM met with patient to discuss dispositional needs. Patient states he lives at home with his spouse and was independent with his ADLs PTA. Demographics verified and PCP. PT eval complete with HHPT/rollator recommended. CM discussed recommendations with patient agreeable to [facility], but requesting to discuss the need for the rollator with his spouse prior to arranging. CMS HH compare list was provided with [facility] selected. [place] referral given to [place] liaison; AVS updated. CM team will continue to follow.               Expected Discharge Plan: [place] Barriers to Discharge: Continued Medical Work up   Patient Goals and CMS Choice Patient states their goals for this hospitalization and ongoing recovery are:: "To progress so I can soon drive" CMS Medicare.gov Compare Post Acute Care list provided to:: Patient Choice offered to / list presented to : Patient  Expected Discharge Plan and Services Expected Discharge Plan: [place] Discharge Planning Services: CM Consult Post Acute Care Choice: [place] arrangements for the past 2 months: Single Family Home                 DME Arranged: N/A DME Agency: NA HH Arranged: RN, Disease Management, PT [place] Agency: [place]  Prior Living Arrangements/Services Living arrangements for the past 2 months: Single Family Home Lives with:: Self, Spouse Patient language and need for interpreter reviewed:: No Do you feel safe going back to the place where you live?: Yes      Need for Family Participation in Patient Care: No (Comment) Care giver  support system in place?: Yes (comment)   Criminal Activity/Legal Involvement Pertinent to Current Situation/Hospitalization: No - Comment as needed  Activities of Daily Living Home Assistive Devices/Equipment: CBG Meter ADL Screening (condition at time of admission) Patient's cognitive ability adequate to safely complete daily activities?: Yes Is the patient deaf or have difficulty hearing?: No Does the patient have difficulty seeing, even when wearing glasses/contacts?: No Does the patient have difficulty concentrating, remembering, or making decisions?: No Patient able to express need for assistance with ADLs?: Yes Does the patient have difficulty dressing or bathing?: No Independently performs ADLs?: Yes (appropriate for developmental age) Does the patient have difficulty walking or climbing stairs?: Yes Weakness of Legs: Both Weakness of Arms/Hands: None  Permission Sought/Granted Permission sought to share information with : Case Manager, Customer service manager Permission granted to share information with : Yes, Verbal Permission Granted     Permission granted to share info w AGENCY: [facility]        Emotional Assessment Appearance:: Appears stated age Attitude/Demeanor/Rapport: Engaged Affect (typically observed): Accepting, Appropriate, Calm, Pleasant Orientation: : Oriented to Self, Oriented to Situation, Oriented to Place, Oriented to  Time Alcohol / Substance Use: Not Applicable Psych Involvement: No (comment)  Admission diagnosis:  CAD in native artery [I25.10] Coronary artery disease [I25.10] Patient Active Problem List   Diagnosis Date Noted  . Coronary artery disease 11/10/2018  . CAD in native artery 11/09/2018  . Abnormal CT scan, heart   . Coronary artery disease of native  artery of native heart with stable angina pectoris ([place]) 11/06/2018  . Mild obesity 11/06/2018  . Uncontrolled type 2 diabetes mellitus with hyperglycemia ([place]) 11/06/2018  .  Chronic diastolic (congestive) heart failure ([place]) 11/06/2018  . Mixed hyperlipidemia 10/10/2018  . DM (diabetes mellitus), type 2, uncontrolled w/neurologic complication ([place]) 90/90/3014  . History of transient ischemic attack (TIA) 10/10/2018  . OSA (obstructive sleep apnea) 10/10/2018  . Essential hypertension 10/10/2018  . Angina pectoris ([place]) 05/16/2018  . Type 2 diabetes mellitus with hyperlipidemia ([place]) 05/16/2018  . Vitamin D deficiency 11/02/2015  . Obesity (BMI 30-39.9) 04/17/2014  . Reflux gastritis 04/17/2014  . Hiatal hernia without gangrene and obstruction 04/17/2014  . Plantar fasciitis, left 03/02/2014  . Diabetic peripheral neuropathy associated with type 2 diabetes mellitus ([place]) 11/22/2013  . Epididymitis 02/04/2012  . UTI (urinary tract infection) 02/04/2012  . DDD (degenerative disc disease), cervical 10/24/2010  . Gout, unspecified 10/24/2010  . OA (osteoarthritis) 10/24/2010  . Allergic rhinitis 03/04/2007  . Asthma 03/04/2007   PCP:  Donzetta Kohut, MD Pharmacy:   [facility] [address] [place] [address] Phone: [phone] Fax: [phone]     Social Determinants of Health (SDOH) Interventions    Readmission Risk Interventions 30 Day Unplanned Readmission Risk Score     Admission (Current) from 11/09/2018 in [facility] 4E CV SURGICAL PROGRESSIVE CARE  30 Day Unplanned Readmission Risk Score (%)  18 Filed at 11/16/2018 1200     This score is the patient's risk of an unplanned readmission within 30 days of being discharged (0 -100%). The score is based on dignosis, age, lab data, medications, orders, and past utilization.   Low:  0-14.9   Medium: 15-21.9   High: 22-29.9   Extreme: 30 and above       No flowsheet data found.

## 2018-11-17 LAB — GLUCOSE, CAPILLARY
Glucose-Capillary: 101 mg/dL — ABNORMAL HIGH (ref 70–99)
Glucose-Capillary: 73 mg/dL (ref 70–99)
Glucose-Capillary: 194 mg/dL — ABNORMAL HIGH (ref 70–99)
Glucose-Capillary: 103 mg/dL — ABNORMAL HIGH (ref 70–99)
Glucose-Capillary: 132 mg/dL — ABNORMAL HIGH (ref 70–99)

## 2018-11-17 MED ORDER — ATORVASTATIN CALCIUM 80 MG PO TABS: 80.0000 mg | ORAL_TABLET | Freq: Every day | ORAL | 1 refills | Status: DC

## 2018-11-17 MED ORDER — AMIODARONE HCL 200 MG PO TABS: 200.0000 mg | ORAL_TABLET | Freq: Two times a day (BID) | ORAL | 1 refills | Status: DC

## 2018-11-17 MED ORDER — FUROSEMIDE 40 MG PO TABS: 40.0000 mg | ORAL_TABLET | Freq: Every day | ORAL | 0 refills | Status: DC

## 2018-11-17 MED ORDER — ASPIRIN EC 325 MG PO TBEC
325.0000 mg | DELAYED_RELEASE_TABLET | Freq: Every day | ORAL | Status: DC
  Administered 2018-11-17 – 2018-11-19 (×3): 325 mg via ORAL
  Filled 2018-11-17 (×3): qty 1

## 2018-11-17 MED ORDER — ASPIRIN EC 325 MG PO TBEC: 325.0000 mg | DELAYED_RELEASE_TABLET | Freq: Every day | ORAL | Status: DC

## 2018-11-17 MED ORDER — OXYCODONE HCL 5 MG PO TABS: 5.0000 mg | ORAL_TABLET | ORAL | 0 refills | Status: DC | PRN

## 2018-11-17 MED ORDER — METOPROLOL TARTRATE 25 MG PO TABS: 12.5000 mg | ORAL_TABLET | Freq: Two times a day (BID) | ORAL | 1 refills | Status: DC

## 2018-11-17 MED ORDER — NICOTINE 14 MG/24HR TD PT24: 14.0000 mg | MEDICATED_PATCH | Freq: Every day | TRANSDERMAL | 0 refills | Status: DC

## 2018-11-17 MED ORDER — POTASSIUM CHLORIDE CRYS ER 20 MEQ PO TBCR: 20.0000 meq | EXTENDED_RELEASE_TABLET | Freq: Every day | ORAL | Status: AC

## 2018-11-17 MED ORDER — AMIODARONE HCL 200 MG PO TABS
200.0000 mg | ORAL_TABLET | Freq: Two times a day (BID) | ORAL | Status: DC
  Administered 2018-11-17 – 2018-11-19 (×5): 200 mg via ORAL
  Filled 2018-11-17 (×5): qty 1

## 2018-11-17 MED ORDER — GUAIFENESIN ER 600 MG PO TB12: 600.0000 mg | ORAL_TABLET | Freq: Two times a day (BID) | ORAL | Status: DC | PRN

## 2018-11-17 MED ORDER — POTASSIUM CHLORIDE CRYS ER 20 MEQ PO TBCR: 20.0000 meq | EXTENDED_RELEASE_TABLET | Freq: Every day | ORAL | 0 refills | Status: DC

## 2018-11-17 NOTE — Discharge Instructions (Signed)
Diabetes Mellitus and Nutrition, Adult When you have diabetes (diabetes mellitus), it is very important to have healthy eating habits because your blood sugar (glucose) levels are greatly affected by what you eat and drink. Eating healthy foods in the appropriate amounts, at about the same times every day, can help you:  Control your blood glucose.  Lower your risk of heart disease.  Improve your blood pressure.  Reach or maintain a healthy weight. Every person with diabetes is different, and each person has different needs for a meal plan. Your health care provider may recommend that you work with a diet and nutrition specialist (dietitian) to make a meal plan that is best for you. Your meal plan may vary depending on factors such as:  The calories you need.  The medicines you take.  Your weight.  Your blood glucose, blood pressure, and cholesterol levels.  Your activity level.  Other health conditions you have, such as heart or kidney disease. How do carbohydrates affect me? Carbohydrates, also called carbs, affect your blood glucose level more than any other type of food. Eating carbs naturally raises the amount of glucose in your blood. Carb counting is a method for keeping track of how many carbs you eat. Counting carbs is important to keep your blood glucose at a healthy level, especially if you use insulin or take certain oral diabetes medicines. It is important to know how many carbs you can safely have in each meal. This is different for every person. Your dietitian can help you calculate how many carbs you should have at each meal and for each snack. Foods that contain carbs include:  Bread, cereal, rice, pasta, and crackers.  Potatoes and corn.  Peas, beans, and lentils.  Milk and yogurt.  Fruit and juice.  Desserts, such as cakes, cookies, ice cream, and candy. How does alcohol affect me? Alcohol can cause a sudden decrease in blood glucose (hypoglycemia),  especially if you use insulin or take certain oral diabetes medicines. Hypoglycemia can be a life-threatening condition. Symptoms of hypoglycemia (sleepiness, dizziness, and confusion) are similar to symptoms of having too much alcohol. If your health care provider says that alcohol is safe for you, follow these guidelines:  Limit alcohol intake to no more than 1 drink per day for nonpregnant women and 2 drinks per day for men. One drink equals 12 oz of beer, 5 oz of wine, or 1 oz of hard liquor.  Do not drink on an empty stomach.  Keep yourself hydrated with water, diet soda, or unsweetened iced tea.  Keep in mind that regular soda, juice, and other mixers may contain a lot of sugar and must be counted as carbs. What are tips for following this plan?  Reading food labels  Start by checking the serving size on the "Nutrition Facts" label of packaged foods and drinks. The amount of calories, carbs, fats, and other nutrients listed on the label is based on one serving of the item. Many items contain more than one serving per package.  Check the total grams (g) of carbs in one serving. You can calculate the number of servings of carbs in one serving by dividing the total carbs by 15. For example, if a food has 30 g of total carbs, it would be equal to 2 servings of carbs.  Check the number of grams (g) of saturated and trans fats in one serving. Choose foods that have low or no amount of these fats.  Check the number of  milligrams (mg) of salt (sodium) in one serving. Most people should limit total sodium intake to less than 2,300 mg per day.  Always check the nutrition information of foods labeled as "low-fat" or "nonfat". These foods may be higher in added sugar or refined carbs and should be avoided.  Talk to your dietitian to identify your daily goals for nutrients listed on the label. Shopping  Avoid buying canned, premade, or processed foods. These foods tend to be high in fat, sodium,  and added sugar.  Shop around the outside edge of the grocery store. This includes fresh fruits and vegetables, bulk grains, fresh meats, and fresh dairy. Cooking  Use low-heat cooking methods, such as baking, instead of high-heat cooking methods like deep frying.  Cook using healthy oils, such as olive, canola, or sunflower oil.  Avoid cooking with butter, cream, or high-fat meats. Meal planning  Eat meals and snacks regularly, preferably at the same times every day. Avoid going long periods of time without eating.  Eat foods high in fiber, such as fresh fruits, vegetables, beans, and whole grains. Talk to your dietitian about how many servings of carbs you can eat at each meal.  Eat 4-6 ounces (oz) of lean protein each day, such as lean meat, chicken, fish, eggs, or tofu. One oz of lean protein is equal to: ? 1 oz of meat, chicken, or fish. ? 1 egg. ?  cup of tofu.  Eat some foods each day that contain healthy fats, such as avocado, nuts, seeds, and fish. Lifestyle  Check your blood glucose regularly.  Exercise regularly as told by your health care provider. This may include: ? 150 minutes of moderate-intensity or vigorous-intensity exercise each week. This could be brisk walking, biking, or water aerobics. ? Stretching and doing strength exercises, such as yoga or weightlifting, at least 2 times a week.  Take medicines as told by your health care provider.  Do not use any products that contain nicotine or tobacco, such as cigarettes and e-cigarettes. If you need help quitting, ask your health care provider.  Work with a Veterinary surgeon or diabetes educator to identify strategies to manage stress and any emotional and social challenges. Questions to ask a health care provider  Do I need to meet with a diabetes educator?  Do I need to meet with a dietitian?  What number can I call if I have questions?  When are the best times to check my blood glucose? Where to find more  information:  American Diabetes Association: diabetes.org  Academy of Nutrition and Dietetics: www.eatright.[employer] of Diabetes and Digestive and Kidney Diseases (NIH): CarFlippers.tn Summary  A healthy meal plan will help you control your blood glucose and maintain a healthy lifestyle.  Working with a diet and nutrition specialist (dietitian) can help you make a meal plan that is best for you.  Keep in mind that carbohydrates (carbs) and alcohol have immediate effects on your blood glucose levels. It is important to count carbs and to use alcohol carefully. This information is not intended to replace advice given to you by your health care provider. Make sure you discuss any questions you have with your health care provider. Document Released: 05/15/2005 Document Revised: 03/18/2017 Document Reviewed: 09/22/2016 Elsevier Interactive Patient Education  2019 [employer]. Discharge Instructions:  1. You may shower, please wash incisions daily with soap and water and keep dry.  If you wish to cover wounds with dressing you may do so but please keep clean  and change daily.  No tub baths or swimming until incisions have completely healed.  If your incisions become red or develop any drainage please call our office at [phone]  2. No Driving until cleared by Dr. Dennie Maizes office and you are no longer using narcotic pain medications  3. Monitor your weight daily.. Please use the same scale and weigh at same time... If you gain 5-10 lbs in 48 hours with associated lower extremity swelling, please contact our office at [phone]  4. Fever of 101.5 for at least 24 hours with no source, please contact our office at [phone]  5. Activity- up as tolerated, please walk at least 3 times per day.  Avoid strenuous activity, no lifting, pushing, or pulling with your arms over 8-10 lbs for a minimum of 6 weeks  6. If any questions or concerns arise, please do not hesitate  to contact our office at [phone]

## 2018-11-17 NOTE — Progress Notes (Signed)
Patient refused CPAP tonight states he will be up too much using bathroom.  According to patient he has had bowel movement x 4.

## 2018-11-17 NOTE — Progress Notes (Signed)
CARDIAC REHAB PHASE I   PRE:  Rate/Rhythm: 70 SR  BP:  Supine: 115/76  Sitting:   Standing:    SaO2: 82-83%RA   89% 2L  MODE:  Ambulation: 450 ft   POST:  Rate/Rhythm: 75 SR  BP:  Supine:   Sitting: 122/83  Standing:    SaO2: 89-90% 2L   94% 2L with rest 0915-1020 Pt was not on oxygen when we entered room. 83%RA. Put on 2L to walk. Pt walked 450 ft on 2L with gait belt use, rolling walker and asst x 2. Encouraged pt to stand upright as he can, slow down and stay close to walker and take a standing rest break. To recliner after walk. Wrote qualifying note for oxygen if needed. Encouraged pt to walk with PT later and they can check oxygen need again. Encouraged IS and flutter valve. Reviewed staying in the tube and sternal precautions. Encouraged pt to wear seat belt as he stated he does not wear one. Reviewed walking instructions with walker and assistance. Told pt not to walk by himself. Would recommend walker for home use. Discussed watching diabetes and carb counting and gave heart healthy diet. Pt eats out a lot. Left ed materials for wife to read when she comes later this evening.  Discussed CRP 2 and referred to [facility] CRP 2.    Luetta Nutting, RN BSN  11/17/2018 10:15 AM

## 2018-11-17 NOTE — Progress Notes (Addendum)
      [address]       [place] [address]             [phone]        7 Days Post-Op Procedure(s) (LRB): CORONARY ARTERY BYPASS GRAFTING (CABG) x Two , using left internal mammary artery and right leg greater saphenous vein harvested endoscopically - SVG to Ramus, LIMA to LAD (N/A) TRANSESOPHAGEAL ECHOCARDIOGRAM (TEE) (N/A)  Subjective: Patient had a bowel movement. He is just waking up this am.   Objective: Vital signs in last 24 hours: Temp:  [97.7 F (36.5 C)-99.3 F (37.4 C)] 97.7 F (36.5 C) (03/18 0345) Pulse Rate:  [44-79] 63 (03/18 0350) Cardiac Rhythm: Normal sinus rhythm (03/17 1900) Resp:  [16-26] 16 (03/18 0355) BP: (94-122)/(64-81) 94/64 (03/18 0350) SpO2:  [86 %-93 %] 93 % (03/18 0350) Weight:  [94.7 kg] 94.7 kg (03/18 0350)  Pre op weight 89.1 kg Current Weight  11/17/18 94.7 kg      Intake/Output from previous day: 03/17 0701 - 03/18 0700 In: 540 [P.O.:540] Out: -    Physical Exam:  Cardiovascular: RRR Pulmonary: Slightly diminished at right base Abdomen: Soft, non tender, bowel sounds present. Extremities: Mild bilateral lower extremity edema. Wounds: Clean and dry.  No erythema or signs of infection.  Lab Results: CBC: Recent Labs    11/15/18 0450 11/16/18 0227  WBC 8.4 10.7*  HGB 10.1* 11.3*  HCT 30.7* 34.5*  PLT 170 191   BMET:  Recent Labs    11/15/18 0450 11/16/18 0227  NA 135 134*  K 3.9 3.8  CL 102 97*  CO2 28 27  GLUCOSE 102* 142*  BUN 18 18  CREATININE 1.07 1.23  CALCIUM 7.9* 8.3*    PT/INR:  Lab Results  Component Value Date   INR 1.2 11/10/2018   INR 1.0 11/09/2018   ABG:  INR: Will add last result for INR, ABG once components are confirmed Will add last 4 CBG results once components are confirmed  Assessment/Plan:  1. CV - Previous a fib with RVR. He has been maintaining SR in the 60-70's. On Amiodarone 400 mg bid and Lopressor 12.5 mg bid. Will decrease Amiodarone to 200 mg bid  secondary to decreased HR and BP. 2.  Pulmonary - On CPAP at night. Await this am's CXR. Encourage incentive spirometer. 3. Volume Overload - On Lasix 40 mg daily 4.  Acute blood loss anemia - H and H increased to 11.3 and 34.5 5. DM-CBGs 138/101/73. On Metformin 1000 mg bid, Glipizide 5 mg daily, and Insulin. Pre op HGA1C 11.8. 6. Restart ecasa 7. Patient wants to walk and determine how he feels. I will re evaluate him after I assist in surgery this am. Possible discharge later today.  Donielle M ZimmermanPA-C 11/17/2018,7:19 AM [phone]  I have seen and examined Eric [name] and agree with the above assessment  and plan.  Delight Ovens MD Beeper [phone] Office [phone] 11/17/2018 8:51 AM

## 2018-11-17 NOTE — Progress Notes (Addendum)
I re evaluated Eric [name] this afternoon. He has had decreased oxygenation and is requiring 2 liters of oxygen via [place]. Will continue with Lasix. PA/LAT CXR to be taken in am. He may require home oxygen. Will re evaluate in am.  I have seen and examined Eric [name] and agree with the above assessment  and plan.  Delight Ovens MD Beeper [phone] Office [phone] 11/17/2018 7:46 PM

## 2018-11-17 NOTE — Progress Notes (Signed)
CARDIAC REHAB PHASE I   Offered to walk with pt. Pt declining at this time stating he needs to use the bathroom. Pt helped to bathroom, pt with staggered gait. Pt given bathroom call light, and NT made aware. Will encourage ambulation later today as time allows.  Reynold Bowen, RN BSN 11/16/2018 1058

## 2018-11-17 NOTE — Progress Notes (Signed)
SATURATION QUALIFICATIONS: (This note is used to comply with regulatory documentation for home oxygen)  Patient Saturations on Room Air at Rest = 83%  Patient Saturations on Room Air while Ambulating =  Not did walk on RA since 83% in bed resting  Patient Saturations on 2 Liters of oxygen while Ambulating = 90%  Please briefly explain why patient needs home oxygen: pt desat on RA and needed oxygen for walk

## 2018-11-17 NOTE — Telephone Encounter (Signed)
Currently admitted.

## 2018-11-17 NOTE — TOC Progression Note (Signed)
Transition of Care [facility]) - Progression Note    Patient Details  Name: Eric [name] MRN: [id number] Date of Birth: 04-19-1961  Transition of Care [facility]) CM/SW Contact  Lawerance Sabal, RN Phone Number: 11/17/2018, 4:27 PM  Clinical Narrative:     Verified w [facility] that are taking patient for [facility] services. Patient has qualifying sat note for home O2, no order for home oxygen or discharge at this time.   Expected Discharge Plan: Home w Home Health Services Barriers to Discharge: Continued Medical Work up  Expected Discharge Plan and Services Expected Discharge Plan: Home w Home Health Services Discharge Planning Services: CM Consult Post Acute Care Choice: Home Health Living arrangements for the past 2 months: Single Family Home                 DME Arranged: N/A DME Agency: NA HH Arranged: RN, Disease Management, PT HH Agency: [facility]   Social Determinants of Health (SDOH) Interventions    Readmission Risk Interventions 30 Day Unplanned Readmission Risk Score     Admission (Current) from 11/09/2018 in [facility] 4E CV SURGICAL PROGRESSIVE CARE  30 Day Unplanned Readmission Risk Score (%)  18 Filed at 11/17/2018 1600     This score is the patient's risk of an unplanned readmission within 30 days of being discharged (0 -100%). The score is based on dignosis, age, lab data, medications, orders, and past utilization.   Low:  0-14.9   Medium: 15-21.9   High: 22-29.9   Extreme: 30 and above       No flowsheet data found.

## 2018-11-18 ENCOUNTER — Inpatient Hospital Stay (HOSPITAL_COMMUNITY): Payer: Medicare Other

## 2018-11-18 LAB — GLUCOSE, CAPILLARY
Glucose-Capillary: 73 mg/dL (ref 70–99)
Glucose-Capillary: 105 mg/dL — ABNORMAL HIGH (ref 70–99)
Glucose-Capillary: 105 mg/dL — ABNORMAL HIGH (ref 70–99)
Glucose-Capillary: 63 mg/dL — ABNORMAL LOW (ref 70–99)
Glucose-Capillary: 156 mg/dL — ABNORMAL HIGH (ref 70–99)

## 2018-11-18 MED ORDER — POTASSIUM CHLORIDE CRYS ER 20 MEQ PO TBCR
20.0000 meq | EXTENDED_RELEASE_TABLET | Freq: Every day | ORAL | Status: DC
  Administered 2018-11-18 – 2018-11-19 (×2): 20 meq via ORAL
  Filled 2018-11-18 (×2): qty 1

## 2018-11-18 NOTE — Progress Notes (Signed)
Physical Therapy Treatment Patient Details Name: Eric [name] MRN: [id number] DOB: 06/03/1961 Today's Date: 11/18/2018    History of Present Illness Pt adm with chest pain. 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 PMH - uncontrolled DM, neuropathy, TIA, fatty liver    PT Comments    Pt was seen for gait with no O2 initially and was desaturated to 74% immediately.  Upon use of 3L over a half hour later still dropped to 74% from initial 94%, and with 4L O2 did the same.  Pt is on 3L baseline in his room.  Talked with him about staying on the O2 at rest, as he is taking it off often.  Will continue to progress to longer distances with O2 as his condition permits.      Follow Up Recommendations  Home health PT;Supervision/Assistance - 24 hour     Equipment Recommendations  Other (comment)(rollator)    Recommendations for Other Services OT consult     Precautions / Restrictions Precautions Precautions: Fall;Sternal Precaution Booklet Issued: No Precaution Comments: verbally reinforced; pt unable to verbalize sternal precautions Restrictions Weight Bearing Restrictions: Yes Other Position/Activity Restrictions: Sternal precautions    Mobility  Bed Mobility Overal bed mobility: Needs Assistance Bed Mobility: Supine to Sit;Sit to Supine   Sidelying to sit: Supervision Supine to sit: Supervision Sit to supine: Supervision Sit to sidelying: Supervision General bed mobility comments: moves impulsively  Transfers Overall transfer level: Needs assistance Equipment used: None Transfers: Sit to/from Stand Sit to Stand: Supervision         General transfer comment: safety cues due to poor awareness of controlling descent and use of equpment to stand  Ambulation/Gait Ambulation/Gait assistance: Min guard Gait Distance (Feet): 280 Feet(70 x 4) Assistive device: Rolling walker (2 wheeled) Gait Pattern/deviations: Step-through pattern;Decreased stride length;Wide base of support Gait  velocity: decreased Gait velocity interpretation: <1.31 ft/sec, indicative of household ambulator     Stairs             Wheelchair Mobility    Modified Rankin (Stroke Patients Only)       Balance     Sitting balance-Leahy Scale: Good     Standing balance support: Bilateral upper extremity supported Standing balance-Leahy Scale: Fair                              Cognition Arousal/Alertness: Awake/alert Behavior During Therapy: Impulsive Overall Cognitive Status: Impaired/Different from baseline Area of Impairment: Attention;Following commands;Safety/judgement;Awareness;Problem solving                   Current Attention Level: Selective Memory: Decreased recall of precautions;Decreased short-term memory Following Commands: Follows one step commands inconsistently;Follows one step commands with increased time Safety/Judgement: Decreased awareness of safety;Decreased awareness of deficits Awareness: Emergent Problem Solving: Slow processing;Requires verbal cues General Comments: requires frequent reminders for precautions despite verbal/visual demonstration      Exercises      General Comments        Pertinent Vitals/Pain Pain Assessment: Faces Faces Pain Scale: Hurts a little bit Pain Location: incisional Pain Descriptors / Indicators: Operative site guarding Pain Intervention(s): Monitored during session;Premedicated before session    Home Living                      Prior Function            PT Goals (current goals can now be found in the care plan section) Acute Rehab  PT Goals Patient Stated Goal: Drive and watch TV Progress towards PT goals: Progressing toward goals    Frequency    Min 3X/week      PT Plan Current plan remains appropriate    Co-evaluation              AM-PAC PT "6 Clicks" Mobility   Outcome Measure  Help needed turning from your back to your side while in a flat bed without  using bedrails?: None Help needed moving from lying on your back to sitting on the side of a flat bed without using bedrails?: A Little Help needed moving to and from a bed to a chair (including a wheelchair)?: A Little Help needed standing up from a chair using your arms (e.g., wheelchair or bedside chair)?: A Little Help needed to walk in hospital room?: A Little Help needed climbing 3-5 steps with a railing? : A Lot 6 Click Score: 18    End of Session Equipment Utilized During Treatment: Gait belt Activity Tolerance: Treatment limited secondary to medical complications (Comment)(frequent drops of O2 sats even on 4L O2) Patient left: in bed;with call bell/phone within reach Nurse Communication: Mobility status PT Visit Diagnosis: Unsteadiness on feet (R26.81);Other abnormalities of gait and mobility (R26.89);Muscle weakness (generalized) (M62.81)     Time: 1029-1048(+1125-1144) PT Time Calculation (min) (ACUTE ONLY): 19 min  Charges:  $Gait Training: 23-37 mins $Therapeutic Activity: 8-22 mins                    Ivar Drape 11/18/2018, 12:03 PM  Samul Dada, PT MS Acute Rehab Dept. Number: [facility] [id number] and [facility] [phone]

## 2018-11-18 NOTE — Progress Notes (Addendum)
      [address]       [place] [address]             [phone]      8 Days Post-Op Procedure(s) (LRB): CORONARY ARTERY BYPASS GRAFTING (CABG) x Two , using left internal mammary artery and right leg greater saphenous vein harvested endoscopically - SVG to Ramus, LIMA to LAD (N/A) TRANSESOPHAGEAL ECHOCARDIOGRAM (TEE) (N/A) Subjective: Feels okay this morning. Was up all night using the bathroom so he is sleepy this morning. Wants to go home.   Objective: Vital signs in last 24 hours: Temp:  [97.9 F (36.6 C)-98.4 F (36.9 C)] 98 F (36.7 C) (03/19 0753) Pulse Rate:  [64-79] 68 (03/19 0753) Cardiac Rhythm: Normal sinus rhythm (03/19 0700) Resp:  [6-19] 18 (03/19 0753) BP: (102-111)/(68-72) 102/70 (03/19 0753) SpO2:  [91 %-98 %] 93 % (03/19 0753) Weight:  [94.6 kg] 94.6 kg (03/19 0500)     Intake/Output from previous day: 03/18 0701 - 03/19 0700 In: 420 [P.O.:420] Out: -  Intake/Output this shift: No intake/output data recorded.  General appearance: alert, cooperative and no distress Heart: regular rate and rhythm, S1, S2 normal, no murmur, click, rub or gallop Lungs: clear to auscultation bilaterally Abdomen: soft, non-tender; bowel sounds normal; no masses,  no organomegaly Extremities: extremities normal, atraumatic, no cyanosis or edema Wound: clean and dry  Lab Results: Recent Labs    11/16/18 0227  WBC 10.7*  HGB 11.3*  HCT 34.5*  PLT 191   BMET:  Recent Labs    11/16/18 0227  NA 134*  K 3.8  CL 97*  CO2 27  GLUCOSE 142*  BUN 18  CREATININE 1.23  CALCIUM 8.3*    PT/INR: No results for input(s): LABPROT, INR in the last 72 hours. ABG    Component Value Date/Time   PHART 7.340 (L) 11/11/2018 0636   HCO3 22.5 11/11/2018 0636   TCO2 24 11/11/2018 0636   ACIDBASEDEF 3.0 (H) 11/11/2018 0636   O2SAT 72.5 11/15/2018 0450   CBG (last 3)  Recent Labs    11/17/18 2122 11/18/18 0251 11/18/18 0606  GLUCAP 132* 73 105*     Assessment/Plan: S/P Procedure(s) (LRB): CORONARY ARTERY BYPASS GRAFTING (CABG) x Two , using left internal mammary artery and right leg greater saphenous vein harvested endoscopically - SVG to Ramus, LIMA to LAD (N/A) TRANSESOPHAGEAL ECHOCARDIOGRAM (TEE) (N/A)  1. CV-NSR in the 60s, BP well controlled. Continue Amio 200mg  BID, ASA,  Lopressor 12.5mg  BID, and statin.  2. Pulm-tolerating room air with good oxygen saturation. CXR shows: Little interval change in size of small bilateral pleural effusions, left greater than right, although associated bibasilar atelectasis is improved with improved aeration of both lung bases.  3. Renal-creatinine 1.23, electrolytes okay-continue oral Lasix for fluid overload. Resume daily potassium .  4. H and H 11.3/34.5, expected acute blood loss anemia.  5. Endo-blood glucose has been well controlled. Continue Metformin, glipizide, Levemir and SSI 6. Continue lovenox for DVT prophylaxis  Plan: 6 minute walk test today to evaluate for home oxygen needs. Discharge instructions discussed with the patient. Likely home tomorrow. Working with case management for at home needs.     LOS: 9 days    [name] 11/18/2018  pposs home on 3/20 if o2 ok I have seen and examined Eric [name] and agree with the above assessment  and plan.  Delight Ovens MD Beeper [phone] Office [phone] 11/19/2018 8:09 AM

## 2018-11-18 NOTE — Progress Notes (Signed)
CARDIAC REHAB PHASE I   PRE:  Rate/Rhythm: 65 SR   BP:  Supine:   Sitting: 111/79  Standing:    SaO2: 95% 3L    MODE:  Ambulation: 500 ft Then 370 ft   POST:  Rate/Rhythm: 70 SR  BP:  Supine:   Sitting: 123/80  Standing:    SaO2: pt desat when probe on fingers 87 3-4L   Put on thumb where there is nail and above 90%RA  1311- 1402 Pt walked 500 ft on 3 to 4L and sats were reading 87% and pt asymptomatic. Noticed that pt has very little nails as he stated he bites them.  Had pt rest and put probe on thumb where there is more nail and walk again and pt maintained above 90% the whole walk of 370 ft. Left off in room and notified RN. May want to walk again later with probe on ear or forehead to see results. Notified NT and RN to monitor sats on RA to see if pt drops. Pt knows also to put on oxygen if he is SOB. Left him with oxygen cannula.   Luetta Nutting, RN BSN  11/18/2018 1:54 PM

## 2018-11-18 NOTE — Progress Notes (Signed)
Occupational Therapy Treatment Patient Details Name: Eric [name] MRN: [id number] DOB: 01/22/1961 Today's Date: 11/18/2018    History of present illness Pt adm with chest pain. 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 PMH - uncontrolled DM, neuropathy, TIA, fatty liver   OT comments  Pt continues to present with poor adherence to sternal precautions and requires cues throughout session. Administering Pill Box Test to assess cognition and executive functioning. Pt passing with one error, but presented with disorganized thought, impulsivity, and is highly internally distracted. Throughout session, pt perseverating on topic of the court system and how many BMs he had last night. Pt requiring 17 minutes to complete pill box task, which should be completed in 5 minutes. During session, SpO2 dropping to 70s-80s on RA since pt took off the O2. Placed back on 2L O2 and SpO2 ranged between 80s-90s. Providing education on compensatory techniques for dress and pt will need further education/practice. Continue to recommend HHOT and 24/7 supervision for safety. Will continue to follow acutely as admitted.    Follow Up Recommendations  Home health OT;Supervision/Assistance - 24 hour    Equipment Recommendations  3 in 1 bedside commode    Recommendations for Other Services      Precautions / Restrictions Precautions Precautions: Fall;Sternal Precaution Booklet Issued: No Precaution Comments: Pt unable to recall sternal precautions stating "no one has told me about that". Pt also requiring cues throughout to adhere to precautions Restrictions Weight Bearing Restrictions: Yes Other Position/Activity Restrictions: Sternal precautions       Mobility Bed Mobility Overal bed mobility: Needs Assistance Bed Mobility: Rolling;Sidelying to Sit;Sit to Sidelying Rolling: Supervision Sidelying to sit: Supervision     Sit to sidelying: Supervision General bed mobility comments: moves impulsively  Transfers Overall  transfer level: Needs assistance Equipment used: None Transfers: Sit to/from Stand Sit to Stand: Min guard         General transfer comment: safety cues due to poor awareness of controlling descent and use of equpment to stand    Balance Overall balance assessment: Needs assistance Sitting-balance support: Feet supported Sitting balance-Leahy Scale: Good Sitting balance - Comments: UE support   Standing balance support: Bilateral upper extremity supported Standing balance-Leahy Scale: Fair                             ADL either performed or assessed with clinical judgement   ADL Overall ADL's : Needs assistance/impaired                   Upper Body Dressing Details (indicate cue type and reason): Initating education on compensatory techniques for UB dressing. Pt with poor attention to education and would benefit from trail and error practice.   Lower Body Dressing Details (indicate cue type and reason): Pt able to bring ankles to knees with increased effort. Pt with poor attention to education on LB dressing Toilet Transfer: Min guard(simulated in room) Toilet Transfer Details (indicate cue type and reason): Min Guard A for safey. However, requiring Max cues for sternal precautions         Functional mobility during ADLs: Min guard General ADL Comments: Administering the Pill Box Test for cognition; see cognition section. Provided education on compensatory techniques for dressing. However, pt with poor attention and would benefit from trail and error practice     Vision   Vision Assessment?: No apparent visual deficits   Perception     Praxis  Cognition Arousal/Alertness: Awake/alert Behavior During Therapy: Impulsive Overall Cognitive Status: Impaired/Different from baseline Area of Impairment: Attention;Following commands;Safety/judgement;Awareness;Problem solving                   Current Attention Level: Selective Memory:  Decreased recall of precautions;Decreased short-term memory Following Commands: Follows one step commands inconsistently;Follows one step commands with increased time Safety/Judgement: Decreased awareness of safety;Decreased awareness of deficits Awareness: Emergent Problem Solving: Slow processing;Requires verbal cues;Difficulty sequencing General Comments: Pt with poor adherance to precautions and required Max cues throughout session to maintain precautions. Administered the Pill Box Test to assess cognition and executive functioning. Pt passing test with one error. However, he presenting with poor organized thought, was impulsive, and highly internally distracted. Throughout test, pt distracted and perseverating on topics of BMs last night and the court system. During the first step of reading the labels, pt reading "take one tablet by mouth daily twice at breakfast and dinner." pt then stating "that means four pills a day." When placing pills into pill box, pt first doing Sunday with each bottle. Once finishing Sunday, pt began placing pills on top of the lids instead of opening the boxes like he did for Sunday. After awhile, provided cues to pt that he must complete the task by placing pills into the pill box and not on top of it. Pt then stating "gosh. I can't open it. If I had my pocket knife, I could open them things up fast." Pt then proceeded to use his plastic dinner knife to open the boxes. Pt missing one of the "every other day" pills. Pt requiring 17 minutes to complete task, which task should be completed in 5 minutes.        Exercises     Shoulder Instructions       General Comments SpO2 flucuating between 80s and 90s throughout session on 2L O2. Poor reading. Notified RN    Pertinent Vitals/ Pain       Pain Assessment: Faces Faces Pain Scale: Hurts a little bit Pain Location: incisional Pain Descriptors / Indicators: Operative site guarding Pain Intervention(s): Monitored  during session;Limited activity within patient's tolerance;Repositioned  Home Living                                          Prior Functioning/Environment              Frequency  Min 2X/week        Progress Toward Goals  OT Goals(current goals can now be found in the care plan section)  Progress towards OT goals: Progressing toward goals  Acute Rehab OT Goals Patient Stated Goal: Drive and watch TV OT Goal Formulation: With patient/family Time For Goal Achievement: 11/27/18 Potential to Achieve Goals: Good ADL Goals Pt Will Perform Grooming: with supervision;standing Pt Will Perform Lower Body Dressing: with min assist;sit to/from stand;with adaptive equipment Pt Will Transfer to Toilet: with supervision;ambulating;bedside commode;regular height toilet Pt Will Perform Toileting - Clothing Manipulation and hygiene: with supervision;with adaptive equipment;sit to/from stand Additional ADL Goal #1: Pt will verbalize and implement all sternal precautions during dressing and bathing tasks.  Plan Discharge plan remains appropriate    Co-evaluation                 AM-PAC OT "6 Clicks" Daily Activity     Outcome Measure   Help from another person eating meals?: None  Help from another person taking care of personal grooming?: A Little Help from another person toileting, which includes using toliet, bedpan, or urinal?: A Little Help from another person bathing (including washing, rinsing, drying)?: A Little Help from another person to put on and taking off regular upper body clothing?: A Lot Help from another person to put on and taking off regular lower body clothing?: A Lot 6 Click Score: 17    End of Session Equipment Utilized During Treatment: Oxygen(2L)  OT Visit Diagnosis: Other abnormalities of gait and mobility (R26.89);Muscle weakness (generalized) (M62.81);Other symptoms and signs involving cognitive function   Activity Tolerance  Patient tolerated treatment well   Patient Left in bed;with call bell/phone within reach;with bed alarm set   Nurse Communication Mobility status        Time: 9233-0076 OT Time Calculation (min): 39 min  Charges: OT General Charges $OT Visit: 1 Visit OT Treatments $Self Care/Home Management : 38-52 mins  Marsha Gundlach MSOT, OTR/L Acute Rehab Pager: [phone] Office: [phone]   Theodoro Grist Ambermarie Honeyman 11/18/2018, 5:21 PM

## 2018-11-18 NOTE — TOC Transition Note (Signed)
Transition of Care [facility]) - CM/SW Discharge Note   Patient Details  Name: Eric [name] MRN: [id number] Date of Birth: Jun 30, 1961  Transition of Care [facility]) CM/SW Contact:  Lawerance Sabal, RN Phone Number: 11/18/2018, 10:33 AM   Clinical Narrative:     Ordered home oxygen, rollator, and 3/1 to be delivered to room through [employer]. Notified [facility] of DC today.  Spoke w patient and discussed DC plan, wife to provide transport home.   Expected Discharge Plan and Services Barriers to Discharge: Barriers Resolved Expected Discharge Plan: Home w Home Health Services Discharge Planning Services: CM Consult Post Acute Care Choice: Home Health Living arrangements for the past 2 months: Single Family Home                 DME Arranged: N/A DME Agency: NA HH Arranged: RN, Disease Management, PT HH Agency: [facility]   Final next level of care: Home w Home Health Services Barriers to Discharge: Barriers Resolved   Patient Goals and CMS Choice Patient states their goals for this hospitalization and ongoing recovery are:: "To progress so I can soon drive" CMS Medicare.gov Compare Post Acute Care list provided to:: Patient Choice offered to / list presented to : Patient  Discharge Placement                       Discharge Plan and Services Discharge Planning Services: CM Consult Post Acute Care Choice: Home Health          DME Arranged: N/A DME Agency: NA HH Arranged: RN, Disease Management, PT HH Agency: [facility]   Social Determinants of Health (SDOH) Interventions     Readmission Risk Interventions No flowsheet data found.

## 2018-11-18 NOTE — Progress Notes (Signed)
      [address]       [name] [address]             [phone]        This patient will be sent home (tentively tomorrow) on 2L home oxygen due to his chronic diastolic heart failure diagnosis.   Thank you    Jari Favre, PA-C

## 2018-11-18 NOTE — Telephone Encounter (Signed)
STILL ADMITTED °

## 2018-11-19 LAB — GLUCOSE, CAPILLARY
Glucose-Capillary: 44 mg/dL — CL (ref 70–99)
Glucose-Capillary: 109 mg/dL — ABNORMAL HIGH (ref 70–99)
Glucose-Capillary: 133 mg/dL — ABNORMAL HIGH (ref 70–99)
Glucose-Capillary: 170 mg/dL — ABNORMAL HIGH (ref 70–99)
Glucose-Capillary: 108 mg/dL — ABNORMAL HIGH (ref 70–99)

## 2018-11-19 MED ORDER — OXYCODONE HCL 5 MG PO TABS: 5.0000 mg | ORAL_TABLET | Freq: Four times a day (QID) | ORAL | 0 refills | Status: DC | PRN

## 2018-11-19 NOTE — Care Management Important Message (Signed)
Important Message  Patient Details  Name: Eric [name] MRN: [id number] Date of Birth: 1961-07-01   Medicare Important Message Given:  Yes    Jacqui Headen 11/19/2018, 3:35 PM

## 2018-11-19 NOTE — Progress Notes (Addendum)
Glucose 44. Pt sweaty but otherwise asymptomatic. Given 2 lemon ICEE. Recheck glucose 109. Pt states he tends to drop overnight.  Will continue to monitor.

## 2018-11-19 NOTE — Telephone Encounter (Signed)
Currently admitted ./cy

## 2018-11-19 NOTE — Progress Notes (Addendum)
CARDIAC REHAB PHASE I   PRE:  Rate/Rhythm: 67 SR  BP:  Supine:   Sitting: 114/81  Standing:    SaO2: 93%RA per ear  MODE:  Ambulation: 750 ft   POST:  Rate/Rhythm: 78 SR  BP:  Supine:   Sitting: 146/82  Standing:    SaO2: per ear probe  When pt stops and get good wave form, pt sats above 90% RA 8088-1103 Pt not on oxygen when I entered room and no SOB. Put probe on ear and pt walked 750 ft with rolling walker independently. Had pt stop a couple of times and got good wave form with ear probe and above 90%.  Encouraged pt to use IS and walk at home to improve pulmonary status. Left off oxygen and left ear probe in room in case needed for future walk.    Luetta Nutting, RN BSN  11/19/2018 8:27 AM

## 2018-11-19 NOTE — Progress Notes (Signed)
Occupational Therapy Treatment Patient Details Name: Eric [name] MRN: [id number] DOB: 08/13/1961 Today's Date: 11/19/2018    History of present illness Pt adm with chest pain. 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 PMH - uncontrolled DM, neuropathy, TIA, fatty liver   OT comments  Pt with chest pain on arrival and limited to bed level education at this time. Pt 7 out 10 pain left chest.    Follow Up Recommendations  Home health OT;Supervision/Assistance - 24 hour    Equipment Recommendations  3 in 1 bedside commode    Recommendations for Other Services      Precautions / Restrictions Precautions Precautions: Fall;Sternal Precaution Comments: reeducated on sternal precautions       Mobility Bed Mobility Overal bed mobility: Needs Assistance   Rolling: Supervision         General bed mobility comments: educated not to pull with BIL UE on bed rail in abducted position  Transfers                 General transfer comment: bed level     Balance                                           ADL either performed or assessed with clinical judgement   ADL Overall ADL's : Needs assistance/impaired                                       General ADL Comments: pt c/o chest pain on arrival. pt attached to heart monitor on arrival. pt with oxygen saturations 85% RA HR 62. pt placed on 2 L oxygen iwth 95% and 7 out 10 pain. RN providing medication. handout provided and reviewed in detail for precautions     Vision       Perception     Praxis      Cognition Arousal/Alertness: Awake/alert Behavior During Therapy: Impulsive Overall Cognitive Status: Impaired/Different from baseline Area of Impairment: Safety/judgement                               General Comments: poor recall of sternal precautions        Exercises     Shoulder Instructions       General Comments      Pertinent Vitals/ Pain       Pain Assessment:  Faces Pain Score: 7  Pain Location: L chest - feels better with pressure 6 out 10 Pain Descriptors / Indicators: Operative site guarding Pain Intervention(s): Monitored during session;Premedicated before session;Repositioned  Home Living                                          Prior Functioning/Environment              Frequency  Min 2X/week        Progress Toward Goals  OT Goals(current goals can now be found in the care plan section)  Progress towards OT goals: Progressing toward goals  Acute Rehab OT Goals Patient Stated Goal: See dog "wolf" at mothers house OT Goal Formulation: With patient/family Time For Goal Achievement: 11/27/18 Potential to Achieve Goals: Good  ADL Goals Pt Will Perform Grooming: with supervision;standing Pt Will Perform Lower Body Dressing: with min assist;sit to/from stand;with adaptive equipment Pt Will Transfer to Toilet: with supervision;ambulating;bedside commode;regular height toilet Pt Will Perform Toileting - Clothing Manipulation and hygiene: with supervision;with adaptive equipment;sit to/from stand Additional ADL Goal #1: Pt will verbalize and implement all sternal precautions during dressing and bathing tasks.  Plan Discharge plan remains appropriate    Co-evaluation                 AM-PAC OT "6 Clicks" Daily Activity     Outcome Measure   Help from another person eating meals?: None Help from another person taking care of personal grooming?: A Little Help from another person toileting, which includes using toliet, bedpan, or urinal?: A Little Help from another person bathing (including washing, rinsing, drying)?: A Little Help from another person to put on and taking off regular upper body clothing?: A Lot Help from another person to put on and taking off regular lower body clothing?: A Lot 6 Click Score: 17    End of Session Equipment Utilized During Treatment: Oxygen  OT Visit Diagnosis: Other  abnormalities of gait and mobility (R26.89);Muscle weakness (generalized) (M62.81);Other symptoms and signs involving cognitive function   Activity Tolerance Patient tolerated treatment well   Patient Left in bed;with call bell/phone within reach   Nurse Communication Mobility status        Time: 1345(1345)-1409 OT Time Calculation (min): 24 min  Charges: OT General Charges $OT Visit: 1 Visit OT Treatments $Self Care/Home Management : 8-22 mins   Mateo Flow, OTR/L  Acute Rehabilitation Services Pager: [phone] Office: [phone] .    Mateo Flow 11/19/2018, 2:27 PM

## 2018-11-19 NOTE — Progress Notes (Signed)
Pt discharged home with wife.  All instructions given and reviewed, all questions answered.

## 2018-11-19 NOTE — Progress Notes (Addendum)
[address]       [place] [address]             [phone]      9 Days Post-Op Procedure(s) (LRB): CORONARY ARTERY BYPASS GRAFTING (CABG) x Two , using left internal mammary artery and right leg greater saphenous vein harvested endoscopically - SVG to Ramus, LIMA to LAD (N/A) TRANSESOPHAGEAL ECHOCARDIOGRAM (TEE) (N/A) Subjective: Feels well, no new issues  Objective: Vital signs in last 24 hours: Temp:  [97.5 F (36.4 C)-98.5 F (36.9 C)] 97.7 F (36.5 C) (03/20 0341) Pulse Rate:  [56-66] 56 (03/20 0341) Cardiac Rhythm: Normal sinus rhythm (03/20 0730) Resp:  [11-18] 18 (03/20 0341) BP: (95-111)/(65-88) 109/88 (03/20 0341) SpO2:  [95 %-99 %] 98 % (03/20 0341) Weight:  [94.6 kg] 94.6 kg (03/20 0341)  Hemodynamic parameters for last 24 hours:    Intake/Output from previous day: 03/19 0701 - 03/20 0700 In: 480 [P.O.:480] Out: -  Intake/Output this shift: No intake/output data recorded.  General appearance: alert, cooperative and no distress Heart: regular rate and rhythm Lungs: clear to auscultation bilaterally Abdomen: benign exam Extremities: no edema Wound: incis healing well  Lab Results: No results for input(s): WBC, HGB, HCT, PLT in the last 72 hours. BMET: No results for input(s): NA, K, CL, CO2, GLUCOSE, BUN, CREATININE, CALCIUM in the last 72 hours.  PT/INR: No results for input(s): LABPROT, INR in the last 72 hours. ABG    Component Value Date/Time   PHART 7.340 (L) 11/11/2018 0636   HCO3 22.5 11/11/2018 0636   TCO2 24 11/11/2018 0636   ACIDBASEDEF 3.0 (H) 11/11/2018 0636   O2SAT 72.5 11/15/2018 0450   CBG (last 3)  Recent Labs    11/19/18 0348 11/19/18 0535 11/19/18 0633  GLUCAP 44* 109* 133*    Meds Scheduled Meds: . allopurinol  300 mg Oral Daily  . amiodarone  200 mg Oral BID  . aspirin EC  325 mg Oral Daily  . atorvastatin  80 mg Oral q1800  . docusate sodium  200 mg Oral Daily  . enoxaparin (LOVENOX)  injection  40 mg Subcutaneous Q24H  . gabapentin  800 mg Oral TID  . glipiZIDE  5 mg Oral Daily  . insulin aspart  0-20 Units Subcutaneous TID WC  . insulin aspart  5 Units Subcutaneous TID WC  . insulin detemir  25 Units Subcutaneous BID  . mouth rinse  15 mL Mouth Rinse BID  . metFORMIN  1,000 mg Oral BID WC  . metoprolol tartrate  12.5 mg Oral BID  . multivitamin with minerals  1 tablet Oral Once per day on Mon Thu  . nicotine  14 mg Transdermal Daily  . pantoprazole  40 mg Oral QAC breakfast  . potassium chloride  20 mEq Oral Daily  . sodium chloride flush  3 mL Intravenous Q12H   Continuous Infusions: . sodium chloride     PRN Meds:.sodium chloride, alum & mag hydroxide-simeth, bisacodyl **OR** bisacodyl, cyclobenzaprine, fluticasone, guaiFENesin, ondansetron **OR** ondansetron (ZOFRAN) IV, oxyCODONE, sodium chloride flush, traMADol  Xrays Dg Chest 2 View  Result Date: 11/18/2018 CLINICAL DATA:  Follow-up postoperative evaluation, pleural effusions. EXAM: CHEST - 2 VIEW COMPARISON:  Prior radiograph from 11/16/2018. FINDINGS: Median sternotomy wires underlying CABG markers. Stable cardiomegaly. Mediastinal silhouette within normal limits. Lungs mildly hypoinflated. Persistent small left greater than right pleural effusions, little interval changed in size from previous. Associated bibasilar atelectasis as improved, with improved aeration of both lung  bases. No new focal infiltrates. No pulmonary edema. No pneumothorax. No acute osseous finding. IMPRESSION: 1. Little interval change in size of small bilateral pleural effusions, left greater than right, although associated bibasilar atelectasis is improved with improved aeration of both lung bases. 2. Prior CABG. Electronically Signed   By: Rise Mu M.D.   On: 11/18/2018 07:38    Assessment/Plan: S/P Procedure(s) (LRB): CORONARY ARTERY BYPASS GRAFTING (CABG) x Two , using left internal mammary artery and right leg greater  saphenous vein harvested endoscopically - SVG to Ramus, LIMA to LAD (N/A) TRANSESOPHAGEAL ECHOCARDIOGRAM (TEE) (N/A)  1 afeb, VSS- in sinus rhythm 2 sats good on 2 liters, currently on no O2- cardiac rehab to recheck this am- may not need home O2 as accuracy of sats a little in question- varies based on which finger 3 BS - low at times, will resume home dosing with close monitoring at discharge 4 no other new labs 5 will dicharge home later today   LOS: 10 days    [name] 11/19/2018 Pager [phone]  Home today Wounds inatct, sternum stable Walked today woff o2 I have seen and examined Eric [name] and agree with the above assessment  and plan.  Eric Ovens MD Beeper [phone] Office [phone] 11/19/2018 4:29 PM

## 2018-11-20 DIAGNOSIS — Z48812 Encounter for surgical aftercare following surgery on the circulatory system: Secondary | ICD-10-CM | POA: Diagnosis not present

## 2018-11-22 ENCOUNTER — Telehealth: Payer: Self-pay | Admitting: Cardiovascular Disease

## 2018-11-22 NOTE — Telephone Encounter (Signed)
Follow up:     patient wife returning call. Please call patient wife back.

## 2018-11-22 NOTE — Telephone Encounter (Signed)
LM TO CALL BACK ./CY 

## 2018-11-22 NOTE — Telephone Encounter (Signed)
F/U Message        Patient returned your call would like a call back.

## 2018-11-22 NOTE — Telephone Encounter (Signed)
SEE OTHER PHONE NOTE./CY 

## 2018-11-22 NOTE — Telephone Encounter (Signed)
Patient contacted regarding discharge from  [facility]  on 11/09/18  .  Patient understands to follow up with provider Joni Reining NP on 11/30/18 at 8:30 AM  at [facility] . Patient understands discharge instructions? YES Patient understands medications and regiment? NEEDED CLARIFICATION ON CHOLESTEROL MED SHOULD BE TAKING ATORVASTATIN 80 MG QD  Patient understands to bring all medications to this visit? YES

## 2018-11-24 ENCOUNTER — Ambulatory Visit: Payer: Medicare Other | Admitting: Adult Health

## 2018-11-25 ENCOUNTER — Telehealth: Payer: Self-pay

## 2018-11-25 NOTE — Telephone Encounter (Signed)
Eric [name], Social Worker with [facility] contacted the office requesting verbal orders for one additional home health visit for social work to address community resource planning with the patient.  Given verbal order over the phone.  Will await faxed copy for physician signature.

## 2018-11-26 ENCOUNTER — Telehealth: Payer: Self-pay

## 2018-11-26 NOTE — Telephone Encounter (Signed)
APPT: 11-30-2018 @830AM  PT HAS [facility]ROID AND HIS NUMBER IS [phone]. THEY DO HAVE A SCALE AND BP CUFF AND WILL HAVE MEDS AVAILABLE AT PRE-APPT TIME TO DISCUSS WIFE AWARE WE WILL BE CALLING 15 MIN PRIOR TO PRE-CHART FOR APPT W/KL. APPT CHANGED TO VIRTUAL VISIT.    Cardiac Questionnaire:    Since your last visit or hospitalization:    1. Have you been having new or worsening chest pain? NO   2. Have you been having new or worsening shortness of breath? NO 3. Have you been having new or worsening leg swelling, wt gain, or increase in abdominal girth (pants fitting more tightly)? NO   4. Have you had any passing out spells? NO    *A YES to any of these questions would result in the appointment being kept. *If all the answers to these questions are NO, we should indicate that given the current situation regarding the worldwide coronarvirus pandemic, at the recommendation of the CDC, we are looking to limit gatherings in our waiting area, and thus will reschedule their appointment beyond four weeks from today.   _____________   COVID-19 Pre-Screening Questions:  . Do you currently have a fever? NO (yes = cancel and refer to pcp for e-visit) . Have you recently travelled on a cruise, internationally, or to [place], [place], [place], [place], [place], or [place], [place] [employer]) ? NO (yes = cancel, stay home, monitor symptoms, and contact pcp or initiate e-visit if symptoms develop) . Have you been in contact with someone that is currently pending confirmation of Covid19 testing or has been confirmed to have the Covid19 virus?  NO (yes = cancel, stay home, away from tested individual, monitor symptoms, and contact pcp or initiate e-visit if symptoms develop) . Are you currently experiencing fatigue or cough? NO (yes = pt should be prepared to have a mask placed at the time of their visit).

## 2018-11-29 NOTE — Telephone Encounter (Signed)
[id number] 

## 2018-11-29 NOTE — Progress Notes (Signed)
Virtual Visit via Video Note     Evaluation Performed:  Follow-up visit  This visit type was conducted due to national recommendations for restrictions regarding the COVID-19 Pandemic (e.g. social distancing).  This format is felt to be most appropriate for this patient at this time.  All issues noted in this document were discussed and addressed.  No physical exam was performed (except for noted visual exam findings with Telehealth visits).  See MyChart message from today for the patient's consent to telehealth for [facility].   Patient has given permission to conduct this visit via virtual appointment and to bill insurance when I spoke to him via the web. .   Date:  11/30/2018   ID:  Eric [name], DOB [birth year], MRN [id number]  Patient Location:  [address] [place] [address]   Provider location:   [place], [place]  PCP:  Marlowe-Rogers, Hidi, MD  Cardiologist:  Thurmon Fair, MD  Electrophysiologist:  None   Chief Complaint:  Hospital follow up s/p CABG  History of Present Illness:    Eric [name] is a 58 y.o. male who presents via audio/video conferencing for a telehealth visit today.   The patient does not have symptoms concerning for COVID-19 infection (fever, chills, cough, or new SHORTNESS OF BREATH).   History of Present Illness: Eric [name] is a 58 y.o. male who presents for ongoing assessment and management of atrial fibrillation, CAD with severe LAD/ramus requiring CABG X 2 (LIMA to LAD, reverse SVG to intermediate CAD, with right thigh greater SV harvesting on 11/15/2018.   She has other history of Type II Diabetes, which is poorly controlled, complicated by polyneuropathy, HL, fatty liver, hx of TIA, and GERD. He underwent a coronary CT on 11/04/2018 as he was unable to tolerate a stress test due to lower extremity neuropathy. The CTA revealed severe stenosis of the ostial LAD with high risk features, a lot of soft plaque, eccentric lesion.    Left heart cath on 11/09/2018 revealed ostial 85% stenosis at a sharp angle arising from the LM with 70% proximal optional diagonal stenosis, normal left Cx and normal large dominant  RCA.  He was referred to CVTS for CABG. This was performed on 11/15/2018.   He had brief paroxysmal atrial fib with RVR, placed on IV amiodarone and  He converted to NSR and transitioned to po amiodarone 200 mg BID for 2 weeks and then decrease to 200 mg daily on November 27, 2018.   He was discharged on 11/12/2018, on ASA 325 mg , atorvastatin 80 mg daily, lasix 40 mg daily for 5 days only with potassium, metoprolol tartrate 12.5 mg BID , metformin, 25 mg daily. Due to uncontrolled diabetes with a Hgb A1c of 11.6 and is now on metformin 1000 mg BID with home blood glucose monitoring.  He is without cardiac complaint. He is medically compliant, but has some confusion on the metoprolol dose. He has stopped the lasix and the higher dose of the amiodarone as directed. He has been walking daily, up to a 1/4 mile without symptoms. He states his knees hurt him some but no chest pain.   His blood sugars are running higher. He admits to non-compliance with low carb diet, drinking sugary drinks. (He was drinking a Pepsi when I was visiting with him today).  He is due to see his PCP and his CVTS on upcoming appointments.   Prior CV studies:   The following studies were reviewed today: Cardiac Cath 11/09/2018  Ost Ramus to Ramus lesion is 70% stenosed.  Dist LM to Ost LAD lesion is 85% stenosed.   Significant ostial 85% LAD stenosis at a sharp angle arising from the left main with 70% proximal optional diagonal stenosis; normal left circumflex and normal large dominant RCA.  Low normal global LV function with mild anterolateral hypocontractility with an EF of approximately 50 to 55%.  LVEDP 8 mmHg.  RECOMMENDATION: Surgical consult for CABG revascularization surgery.  Will change lovastatin to atorvastatin 80 mg.      CABG 11/15/2018 SURGICAL PROCEDURE:  Coronary artery bypass grafting x2 with the left internal mammary to the left anterior descending coronary artery, reverse saphenous vein graft to the intermediate coronary artery with right thigh greater saphenous endoscopic vein  harvesting.  TEE 11/10/2018 Result status: Final result   Left atrium: No spontaneous echo contrast.  Aortic valve: The valve is trileaflet. No stenosis. No regurgitation.  Right ventricle: Normal cavity size, wall thickness and ejection fraction.  Tricuspid valve: Trace regurgitation.  Pulmonic valve: Trace regurgitation.    Past Medical History:  Diagnosis Date  . Coronary artery disease   . Diabetes mellitus without complication (HCC)   . Fatty liver   . Hyperlipidemia   . Stroke [facility])    Past Surgical History:  Procedure Laterality Date  . CHOLECYSTECTOMY    . CORONARY ARTERY BYPASS GRAFT N/A 11/10/2018   Procedure: CORONARY ARTERY BYPASS GRAFTING (CABG) x Two , using left internal mammary artery and right leg greater saphenous vein harvested endoscopically - SVG to Ramus, LIMA to LAD;  Surgeon: Delight Ovens, MD;  Location: [facility] OR;  Service: Open Heart Surgery;  Laterality: N/A;  . LEFT HEART CATH AND CORONARY ANGIOGRAPHY N/A 11/09/2018   Procedure: LEFT HEART CATH AND CORONARY ANGIOGRAPHY;  Surgeon: Lennette Bihari, MD;  Location: [facility] INVASIVE CV LAB;  Service: Cardiovascular;  Laterality: N/A;  . SHOULDER SURGERY Left   . TEE WITHOUT CARDIOVERSION N/A 11/10/2018   Procedure: TRANSESOPHAGEAL ECHOCARDIOGRAM (TEE);  Surgeon: Delight Ovens, MD;  Location: [facility] OR;  Service: Open Heart Surgery;  Laterality: N/A;     Current Meds  Medication Sig  . ACCU-CHEK AVIVA PLUS test strip   . albuterol (PROVENTIL HFA;VENTOLIN HFA) 108 (90 Base) MCG/ACT inhaler Inhale 2 puffs into the lungs every 6 (six) hours as needed for wheezing or shortness of breath.   . allopurinol (ZYLOPRIM) 300 MG tablet Take 300 mg by mouth  daily.  Marland Kitchen amiodarone (PACERONE) 200 MG tablet Take 1 tablet (200 mg total) by mouth 2 (two) times daily. For 2 weeks then take 200 mg daily thereafter  . atorvastatin (LIPITOR) 80 MG tablet Take 1 tablet (80 mg total) by mouth daily at 6 PM.  . diphenhydrAMINE (BENADRYL) 25 MG tablet Take 25 mg by mouth daily as needed for itching.   . fluticasone (FLONASE) 50 MCG/ACT nasal spray Place 1 spray into both nostrils daily as needed for allergies or rhinitis.  Marland Kitchen gabapentin (NEURONTIN) 400 MG capsule Take 800 mg by mouth 3 (three) times daily.  Marland Kitchen glipiZIDE (GLUCOTROL) 5 MG tablet Take 5 mg by mouth daily.  Marland Kitchen guaiFENesin (MUCINEX) 600 MG 12 hr tablet Take 1 tablet (600 mg total) by mouth 2 (two) times daily as needed for cough.  . insulin lispro protamine-lispro (HUMALOG 75/25 MIX) (75-25) 100 UNIT/ML SUSP injection Inject 20-30 Units into the skin See admin instructions. Inject 40 units in am and 20 units bedtime.  [name] Devices (ADJUSTABLE LANCING DEVICE) MISC   .  metFORMIN (GLUCOPHAGE) 1000 MG tablet Take 1,000 mg by mouth 2 (two) times daily with a meal.   . metoprolol tartrate (LOPRESSOR) 25 MG tablet Take 0.5 tablets (12.5 mg total) by mouth 2 (two) times daily.  . Multiple Vitamins-Minerals (MULTIVITAMIN WITH MINERALS) tablet Take 1 tablet by mouth 2 (two) times a week.   Marland Kitchen omeprazole (PRILOSEC) 20 MG capsule Take 40 mg by mouth daily.   Marland Kitchen oxyCODONE (OXY IR/ROXICODONE) 5 MG immediate release tablet Take 1 tablet (5 mg total) by mouth every 6 (six) hours as needed for severe pain.  . saw palmetto 160 MG capsule Take 160 mg by mouth daily.  . [DISCONTINUED] metoprolol tartrate (LOPRESSOR) 25 MG tablet Take 0.5 tablets (12.5 mg total) by mouth 2 (two) times daily. Take 2 tablet (100 mg) by mouth once for procedure.  . [DISCONTINUED] metoprolol tartrate (LOPRESSOR) 25 MG tablet Take 1 tablet (25 mg total) by mouth 2 (two) times daily.     Allergies:   Bee venom and Lisinopril   Social History    Tobacco Use  . Smoking status: Former Smoker    Packs/day: 1.00    Years: 12.00    Pack years: 12.00    Types: Cigarettes    Start date: 1968    Last attempt to quit: 1981    Years since quitting: 39.2  . Smokeless tobacco: Current User    Types: Chew  Substance Use Topics  . Alcohol use: Yes    Comment: rare social couple of times a year  . Drug use: Never     Family Hx: The patient's family history includes Heart attack in his father; Heart failure in his father; Hyperlipidemia in his mother; Hypothyroidism in his mother.  ROS:   Please see the history of present illness.    He is without complaints with the exception of some soreness in his legs at the vein harvest sites. Otherwise negative.    Labs/Other Tests and Data Reviewed:    Recent Labs: 11/09/2018: ALT 44 11/11/2018: Magnesium 2.2 11/16/2018: BUN 18; Creatinine, Ser 1.23; Hemoglobin 11.3; Platelets 191; Potassium 3.8; Sodium 134   Recent Lipid Panel Lab Results  Component Value Date/Time   CHOL 228 (H) 11/05/2018 04:07 PM   TRIG 587 (HH) 11/05/2018 04:07 PM   HDL 33 (L) 11/05/2018 04:07 PM   CHOLHDL 6.9 (H) 11/05/2018 04:07 PM   LDLCALC Comment 11/05/2018 04:07 PM    Wt Readings from Last 3 Encounters:  11/30/18 187 lb (84.8 kg)  11/19/18 208 lb 9.6 oz (94.6 kg)  11/05/18 199 lb 9.6 oz (90.5 kg)     Exam:    Vital Signs:  BP 111/74   Pulse 65   Ht 5' 6.5" (1.689 m)   Wt 187 lb (84.8 kg)   BMI 29.73 kg/m    Well nourished, well developed male in no acute distress. I have inspected his incisions visually via the web cam. No evidence of infection, or drainage. He does not have evidence of edema on visual inspection via web cam.. No audible wheezing with deep breaths.   ASSESSMENT & PLAN:    1. CAD: S/P 4 vessel CABG on 11/15/2018. He is recovering well. He is taking most of his medications as directed. There is confusion concerning metoprolol dose. I have explained to him that he will need to take  metoprolol tartrate 12.5 mg BID not 25 mg daily as this is a Q 12 hour dose. The bottle also has his instructions prior to the  CT scan. I will have a new Rx sent and have this changed on his AVS so that they have accurate dosing instructions. I had his wife repeat this back to me for clarification.   2. Uncontrolled DM: He has not yet been adherent to his diet. He is still drinking sugary soft drinks and eating high sugar foods. I strongly counseled him on stopping the sugary drinks and adhering to a diabetic diet. He has HHN coming by to see him as well, who are also educating him.   3. Post Operative Atrial fib: Unable to ascertain if he is in atrial fib via Web. He will continue on amiodarone for now at 200 mg daily.   4. Hypertension: Low normal. He will continue on current regime  5. Hypercholesterolemia: Continue high dose statin, atorvastatin 80 mg daily.   6. Tobacco abuse: He continues to dip snuff. He states he has a few cans left and then he will stop. He states he doesn't like to waste money. I have encouraged him to stop anyway. His health is more important. I was unable to convince him of this.   Of note: This patient LOVES the Telemedicine visit. He wants to continue to use this as much as possible [place].  I have explained that it would be important to do a hands on physical assessment periodically as well. He verbalizes understanding.   COVID-19 Education: The signs and symptoms of COVID-19 were discussed with the patient and how to seek care for testing (follow up with PCP or arrange E-visit). The importance of social distancing was discussed today.  Patient Risk:   After full review of this patients clinical status, I feel that they are at least moderate risk at this time.  Time:   Today, I have spent 25 minutes with the patient with telehealth technology discussing his post hospital status, medications, inspection of this  surgical sites and education.    Medication Adjustments/Labs and Tests Ordered: Current medicines are reviewed at length with the patient today.  Concerns regarding medicines are outlined above.   Tests Ordered: No orders of the defined types were placed in this encounter.  Medication Changes: Meds ordered this encounter  Medications  . DISCONTD: metoprolol tartrate (LOPRESSOR) 25 MG tablet    Sig: Take 1 tablet (25 mg total) by mouth 2 (two) times daily.    Dispense:  30 tablet    Refill:  1    Disregard the 50 mg only tab.  . metoprolol tartrate (LOPRESSOR) 25 MG tablet    Sig: Take 0.5 tablets (12.5 mg total) by mouth 2 (two) times daily.    Dispense:  30 tablet    Refill:  1    Disregard the 50 mg only tab.    Disposition:  See him in 3 months unless symptomatic.   Signed, Joni Reining, NP  11/30/2018 9:11 AM    [facility] [address] [place], [place], [place]  [address] Phone: [phone]; Fax: [phone]

## 2018-11-30 ENCOUNTER — Telehealth: Payer: Self-pay

## 2018-11-30 ENCOUNTER — Encounter: Payer: Self-pay | Admitting: Adult Health

## 2018-11-30 ENCOUNTER — Telehealth (INDEPENDENT_AMBULATORY_CARE_PROVIDER_SITE_OTHER): Payer: Medicare Other | Admitting: Adult Health

## 2018-11-30 ENCOUNTER — Other Ambulatory Visit: Payer: Self-pay

## 2018-11-30 ENCOUNTER — Ambulatory Visit: Payer: Medicare Other | Admitting: Adult Health

## 2018-11-30 VITALS — BP 111/74 | HR 65 | Ht 66.5 in | Wt 187.0 lb

## 2018-11-30 DIAGNOSIS — I10 Essential (primary) hypertension: Secondary | ICD-10-CM | POA: Diagnosis not present

## 2018-11-30 DIAGNOSIS — E1165 Type 2 diabetes mellitus with hyperglycemia: Secondary | ICD-10-CM

## 2018-11-30 DIAGNOSIS — Z951 Presence of aortocoronary bypass graft: Secondary | ICD-10-CM

## 2018-11-30 DIAGNOSIS — I2510 Atherosclerotic heart disease of native coronary artery without angina pectoris: Secondary | ICD-10-CM

## 2018-11-30 DIAGNOSIS — Z720 Tobacco use: Secondary | ICD-10-CM

## 2018-11-30 DIAGNOSIS — I480 Paroxysmal atrial fibrillation: Secondary | ICD-10-CM

## 2018-11-30 DIAGNOSIS — E7800 Pure hypercholesterolemia, unspecified: Secondary | ICD-10-CM

## 2018-11-30 MED ORDER — METOPROLOL TARTRATE 25 MG PO TABS: 25.0000 mg | ORAL_TABLET | Freq: Two times a day (BID) | ORAL | 1 refills | Status: DC

## 2018-11-30 MED ORDER — METOPROLOL TARTRATE 25 MG PO TABS: 12.5000 mg | ORAL_TABLET | Freq: Two times a day (BID) | ORAL | 1 refills | Status: DC

## 2018-11-30 NOTE — Progress Notes (Signed)
Eric [name]. He needs a lot of coaching re: diet and exercise. Plan to DC amiodarone when he runs out of the current prescription bottle (he needs it through April 12 or so). MCr

## 2018-11-30 NOTE — Progress Notes (Signed)
OK, I will send this information to Marcelino Duster to contact him about stopping the amiodarone. It was refilled.

## 2018-11-30 NOTE — Patient Instructions (Addendum)
Medication Instructions:  TAKE METOPROLOL 12.5MD TWICE DAILY If you need a refill on your cardiac medications before your next appointment, please call your pharmacy.  Labwork: Take the provided lab slips with you to the lab for your blood draw.   When you have your labs (blood work) drawn today and your tests are completely normal, you will receive your results only by MyChart Message (if you have MyChart) -OR-  A paper copy in the mail.  If you have any lab test that is abnormal or we need to change your treatment, we will call you to review these results.  Special Instructions: KEEP SCHEDULED APPOINTMENTS  Follow-Up: .    FOLLOW UP WITH DR Hamilton Capri, DNP, AACC IN  3 MONTHS.    At [facility], you and your health needs are our priority.  As part of our continuing mission to provide you with exceptional heart care, we have created designated Provider Care Teams.  These Care Teams include your primary Cardiologist (physician) and Advanced Practice Providers (APPs -  Physician Assistants and Nurse Practitioners) who all work together to provide you with the care you need, when you need it.  Thank you for choosing [facility] at [facility]!!

## 2018-11-30 NOTE — Telephone Encounter (Signed)
[email or web address]

## 2018-11-30 NOTE — Telephone Encounter (Signed)
Jodelle Gross, NP  You; Thurmon Fair, MD  (11:49 AM) OK, I will send this information to Marcelino Duster to contact him about stopping the amiodarone. It was refilled.    Croitoru, Rachelle Hora, MD  Jodelle Gross, NP  (11:33 AM)  Nelwyn Salisbury. He needs a lot of coaching re: diet and exercise. Plan to DC amiodarone when he runs out of the current prescription bottle (he needs it through April 12 or so). MCr  [id number]

## 2018-12-01 ENCOUNTER — Other Ambulatory Visit: Payer: Self-pay

## 2018-12-01 ENCOUNTER — Telehealth: Payer: Self-pay | Admitting: Cardiovascular Disease

## 2018-12-01 MED ORDER — METOPROLOL TARTRATE 25 MG PO TABS: 12.5000 mg | ORAL_TABLET | Freq: Two times a day (BID) | ORAL | 3 refills | Status: DC

## 2018-12-01 NOTE — Telephone Encounter (Signed)
[email or web address]  

## 2018-12-01 NOTE — Telephone Encounter (Signed)
Called patient and LVM to call back to schedule Dr. Erin Hearing 3 month visit.

## 2018-12-02 NOTE — Telephone Encounter (Signed)
Follow Up:  Wife called back, she thought it was about his appt. Please call her back, since it is about his medicine.

## 2018-12-02 NOTE — Telephone Encounter (Signed)
Lm2cb pt needs to stop amiodarone on 4-15 Called pharmacy and cancelled refills of amiodarone d/t Dr Renaye Rakers message below.

## 2018-12-02 NOTE — Telephone Encounter (Signed)
Left detailed message for wife.

## 2018-12-02 NOTE — Telephone Encounter (Signed)
Follow up   Patient would like a call back to set up appt per the previous message.

## 2018-12-03 ENCOUNTER — Other Ambulatory Visit: Payer: Self-pay | Admitting: Physician Assistant

## 2018-12-03 ENCOUNTER — Other Ambulatory Visit: Payer: Self-pay

## 2018-12-03 ENCOUNTER — Telehealth: Payer: Self-pay | Admitting: Cardiovascular Disease

## 2018-12-03 ENCOUNTER — Telehealth: Payer: Self-pay | Admitting: *Deleted

## 2018-12-03 DIAGNOSIS — I2510 Atherosclerotic heart disease of native coronary artery without angina pectoris: Secondary | ICD-10-CM

## 2018-12-03 MED ORDER — METOPROLOL TARTRATE 25 MG PO TABS: 12.5000 mg | ORAL_TABLET | Freq: Two times a day (BID) | ORAL | 3 refills | Status: DC

## 2018-12-03 MED ORDER — COLCHICINE 0.6 MG PO TABS: 0.6000 mg | ORAL_TABLET | Freq: Every day | ORAL | 0 refills | Status: DC

## 2018-12-03 NOTE — Telephone Encounter (Signed)
Pharmacist from [facility], no name provided, left a msg on the refill vm stating the you sent in a rx for colchicine for the patient however patient is also on amiodarone. Pharmacist states that effects of colchicine may be increased if taken while on amio. They would like a call back at [phone]. Thanks, MI

## 2018-12-03 NOTE — Telephone Encounter (Signed)
Called patient and LVM for the patient to call and schedule 3 month office visit with Dr. Royann Shivers.

## 2018-12-03 NOTE — Telephone Encounter (Signed)
Message received from Triage that patient is calling with new left-sided chest pain. I called the patient back and he started having left-sided chest pain last night. It was worse when he went to bed and worse when he coughed and with deep inspiration, pain improved when he sat up. The chest pain lasted all night, he was unable to sleep until 4am this morning. He woke up with chest pain again this morning. He says he only coughs when he lays flat. He has not been sick and denies sick contacts. He states he did not have chest pain prior to his heart cath. He has not lifted anything heavy and denies any new activities. He also states he has pain in the right side. Given his symptoms, I do not suspect ACS, but can't rule out pericarditis given his recent CABG.   In consultation with Dr. Royann Shivers, will prescribe colchicine 0.6 mg daily for 2 weeks to being and 400 mg motrin q8 hrs PRN.   I will request a telehealth visit next week with an APP on Dr. Erin Hearing team or Joni Reining, who he has seen recently.  I explained the above plan to the patient, he is in agreement. I told him that if the new medication didn't help, he should call back tomorrow. If the chest pain worsens, he will go to the ER. If his cough worsens he will call his PCP.    Roe Rutherford Adylynn [place], PA-C 12/03/2018, 2:19 PM [phone]

## 2018-12-03 NOTE — Telephone Encounter (Signed)
Spoke with pt who is agreeable to a virtual visit. Appointment scheduled for next Wednesday 12/08/18 at 8 am with Joni Reining, DNP

## 2018-12-03 NOTE — Telephone Encounter (Signed)
New Message   Patient states he has pain in his ribcage and needs a nurse to call him back.

## 2018-12-04 NOTE — Telephone Encounter (Signed)
I called and spoke with home pharmacy and [facility] pharmacy. I canceled the colchicine prescription due to interaction with amiodarone. I attempted to call the patient to check on his pain. The pharmacist said the wife picked up tylenol and an NSAID last evening. I was unable to reach the patient but left a VM to call back if he did not have a reduction in pain.   Roe Rutherford Duke, PA-C 12/04/2018, 11:13 AM [phone]

## 2018-12-06 NOTE — Telephone Encounter (Signed)
Left detailed message to stop Amio on 4-15

## 2018-12-06 NOTE — Progress Notes (Deleted)
{Choose 1 Note Type (Telehealth Visit or Telephone Visit):[phone]}   Evaluation Performed:  Follow-up visit  This visit type was conducted due to national recommendations for restrictions regarding the COVID-19 Pandemic (e.g. social distancing).  This format is felt to be most appropriate for this patient at this time.  All issues noted in this document were discussed and addressed.  No physical exam was performed (except for noted visual exam findings with Telehealth visits).  See MyChart message from today for the patient's consent to telehealth for [facility].  Date:  12/06/2018   ID:  Eric [name], DOB 1961/07/12, MRN [id number]  Patient Location:  [address] [place] [address]   Provider location:   [place], [place] office   PCP:  Marlowe-Rogers, Hidi, MD  Cardiologist:  Thurmon Fair, MD  Electrophysiologist:  None   Chief Complaint:  ***  History of Present Illness:    Eric [name] is a 58 y.o. male who presents via audio/video conferencing for a telehealth visit today. He has a history of atrial fib, CAD with CABG X 2 LIMA to LAD, reverse SVG to Intermediate coronary artery. He has poorly controlled DM, with neuropathy, TIA and GERD.    He was last seen via video encounter less than one week ago. He was doing well then and was without complaints. He had some confusion about his BB prescription and dosing and this was explained and corrected on his medication list.  He called our office to complaint of pain in his left side which was new. He reported that it was worse when he got out of bed and with coughing. Pain improved when he sat up. He was started on colchicine by Dr.Croitoru and was to stop amiodarone on 12/15/2018. However, colchicine was discontinued due to interaction with amiodarone. He was given Motrin 400 mg Q 8 hours as needed. He requested a follow up appointment concerning his symptoms and verbalized understanding about stopping amiodarone.     The patient {does/does not:200015} symptoms concerning for COVID-19 infection (fever, chills, cough, or new SHORTNESS OF BREATH).    Prior CV studies:   The following studies were reviewed today:  ***  Past Medical History:  Diagnosis Date   Coronary artery disease    Diabetes mellitus without complication (HCC)    Fatty liver    Hyperlipidemia    Stroke [facility])    Past Surgical History:  Procedure Laterality Date   CHOLECYSTECTOMY     CORONARY ARTERY BYPASS GRAFT N/A 11/10/2018   Procedure: CORONARY ARTERY BYPASS GRAFTING (CABG) x Two , using left internal mammary artery and right leg greater saphenous vein harvested endoscopically - SVG to Ramus, LIMA to LAD;  Surgeon: Delight Ovens, MD;  Location: [facility] OR;  Service: Open Heart Surgery;  Laterality: N/A;   LEFT HEART CATH AND CORONARY ANGIOGRAPHY N/A 11/09/2018   Procedure: LEFT HEART CATH AND CORONARY ANGIOGRAPHY;  Surgeon: Lennette Bihari, MD;  Location: [facility] INVASIVE CV LAB;  Service: Cardiovascular;  Laterality: N/A;   SHOULDER SURGERY Left    TEE WITHOUT CARDIOVERSION N/A 11/10/2018   Procedure: TRANSESOPHAGEAL ECHOCARDIOGRAM (TEE);  Surgeon: Delight Ovens, MD;  Location: [facility] OR;  Service: Open Heart Surgery;  Laterality: N/A;     No outpatient medications have been marked as taking for the 12/08/18 encounter (Appointment) with Jodelle Gross, NP.     Allergies:   Bee venom and Lisinopril   Social History   Tobacco Use   Smoking status: Former Smoker  Packs/day: 1.00    Years: 12.00    Pack years: 12.00    Types: Cigarettes    Start date: 191968    Last attempt to quit: 1981    Years since quitting: 39.2   Smokeless tobacco: Current User    Types: Chew  Substance Use Topics   Alcohol use: Yes    Comment: rare social couple of times a year   Drug use: Never     Family Hx: The patient's family history includes Heart attack in his father; Heart failure in his father; Hyperlipidemia in his  mother; Hypothyroidism in his mother.  ROS:   Please see the history of present illness.    *** All other systems reviewed and are negative.   Labs/Other Tests and Data Reviewed:    Recent Labs: 11/09/2018: ALT 44 11/11/2018: Magnesium 2.2 11/16/2018: BUN 18; Creatinine, Ser 1.23; Hemoglobin 11.3; Platelets 191; Potassium 3.8; Sodium 134   Recent Lipid Panel Lab Results  Component Value Date/Time   CHOL 228 (H) 11/05/2018 04:07 PM   TRIG 587 (HH) 11/05/2018 04:07 PM   HDL 33 (L) 11/05/2018 04:07 PM   CHOLHDL 6.9 (H) 11/05/2018 04:07 PM   LDLCALC Comment 11/05/2018 04:07 PM    Wt Readings from Last 3 Encounters:  11/30/18 187 lb (84.8 kg)  11/19/18 208 lb 9.6 oz (94.6 kg)  11/05/18 199 lb 9.6 oz (90.5 kg)     Exam:    Vital Signs:  There were no vitals taken for this visit.   Well nourished, well developed male in no*** acute distress. ***  ASSESSMENT & PLAN:    1.  ***  COVID-19 Education: The signs and symptoms of COVID-19 were discussed with the patient and how to seek care for testing (follow up with PCP or arrange E-visit).  ***The importance of social distancing was discussed today.  Patient Risk:   After full review of this patients clinical status, I feel that they are at least moderate risk at this time.  Time:   Today, I have spent *** minutes with the patient with telehealth technology discussing ***.     Medication Adjustments/Labs and Tests Ordered: Current medicines are reviewed at length with the patient today.  Concerns regarding medicines are outlined above.  Tests Ordered: No orders of the defined types were placed in this encounter.  Medication Changes: No orders of the defined types were placed in this encounter.   Disposition:  {follow up:15908}  Signed, Bettey MareKathryn M. Liborio NixonLawrence DNP, ANP, AACC  12/06/2018 3:42 PM    [facility] [address], [place], [place]  [address] Phone: [phone]) [phone]; Fax: [phone]) [phone]

## 2018-12-07 ENCOUNTER — Other Ambulatory Visit: Payer: Self-pay

## 2018-12-07 ENCOUNTER — Telehealth: Payer: Self-pay

## 2018-12-07 ENCOUNTER — Telehealth (HOSPITAL_COMMUNITY): Payer: Self-pay | Admitting: Surgical

## 2018-12-07 NOTE — Telephone Encounter (Signed)
[address]       Eric [name] [address]             [phone]     CARDIOTHORACIC SURGERY TELEPHONE VIRTUAL OFFICE NOTE  Referring Provider is No ref. provider found Primary Cardiologist is Thurmon Fair, MD PCP is Marlowe-Rogers, Hidi, MD   HPI:  I spoke with Eric [name] via telephone on 12/07/2018 at 9:14 AM and verified that I was speaking with the correct person.  He reports that overall he is doing well.  He has had no significant difficulty with his incisions healing although he does report some cramping type pain on the right side of his chest.  He does get good relief with a topical magnesium spray which has relieved his symptoms as well as allowed him to sleep better.  He denies fevers or chills.  He denies chest pain or anginal equivalents.  He denies shortness of breath.  He is not having lower extremity edema.  He is increasing his walking without significant difficulty although it does sound like he is not walking a whole lot.  His longest walks he says last Sunday was three quarters of a mile.  He reports that his blood sugars have been in the 90s to 150 range and I have encouraged him to continue to manage aggressively with low carbohydrate diet.    Current Outpatient Medications  Medication Sig Dispense Refill  . ACCU-CHEK AVIVA PLUS test strip     . albuterol (PROVENTIL HFA;VENTOLIN HFA) 108 (90 Base) MCG/ACT inhaler Inhale 2 puffs into the lungs every 6 (six) hours as needed for wheezing or shortness of breath.     . allopurinol (ZYLOPRIM) 300 MG tablet Take 300 mg by mouth daily.    Marland Kitchen amiodarone (PACERONE) 200 MG tablet Take 1 tablet (200 mg total) by mouth 2 (two) times daily. For 2 weeks then take 200 mg daily thereafter 60 tablet 1  . aspirin EC 325 MG tablet Take 1 tablet (325 mg total) by mouth daily.    Marland Kitchen atorvastatin (LIPITOR) 80 MG tablet Take 1 tablet (80 mg total) by mouth daily at 6 PM. 30 tablet 1  . colchicine 0.6 MG tablet Take 1  tablet (0.6 mg total) by mouth daily. 30 tablet 0  . cyclobenzaprine (FLEXERIL) 10 MG tablet Take 10 mg by mouth 2 (two) times daily as needed for muscle spasms.    . diphenhydrAMINE (BENADRYL) 25 MG tablet Take 25 mg by mouth daily as needed for itching.     . fluticasone (FLONASE) 50 MCG/ACT nasal spray Place 1 spray into both nostrils daily as needed for allergies or rhinitis.    . furosemide (LASIX) 40 MG tablet Take 1 tablet (40 mg total) by mouth daily. For 5 days then stop. (Patient not taking: Reported on 11/30/2018) 5 tablet 0  . gabapentin (NEURONTIN) 400 MG capsule Take 800 mg by mouth 3 (three) times daily.    Marland Kitchen glipiZIDE (GLUCOTROL) 5 MG tablet Take 5 mg by mouth daily.    Marland Kitchen guaiFENesin (MUCINEX) 600 MG 12 hr tablet Take 1 tablet (600 mg total) by mouth 2 (two) times daily as needed for cough.    . insulin lispro protamine-lispro (HUMALOG 75/25 MIX) (75-25) 100 UNIT/ML SUSP injection Inject 20-30 Units into the skin See admin instructions. Inject 40 units in am and 20 units bedtime.    [name] Devices (ADJUSTABLE LANCING DEVICE) MISC     . metFORMIN (GLUCOPHAGE) 1000 MG  tablet Take 1,000 mg by mouth 2 (two) times daily with a meal.     . metoprolol tartrate (LOPRESSOR) 25 MG tablet Take 0.5 tablets (12.5 mg total) by mouth 2 (two) times daily. 30 tablet 3  . Multiple Vitamins-Minerals (MULTIVITAMIN WITH MINERALS) tablet Take 1 tablet by mouth 2 (two) times a week.     Marland Kitchen. omeprazole (PRILOSEC) 20 MG capsule Take 40 mg by mouth daily.     Marland Kitchen. oxyCODONE (OXY IR/ROXICODONE) 5 MG immediate release tablet Take 1 tablet (5 mg total) by mouth every 6 (six) hours as needed for severe pain. 28 tablet 0  . potassium chloride SA (K-DUR,KLOR-CON) 20 MEQ tablet Take 1 tablet (20 mEq total) by mouth daily. For 5 days then stop. (Patient not taking: Reported on 11/30/2018) 5 tablet 0  . saw palmetto 160 MG capsule Take 160 mg by mouth daily.     No current facility-administered medications for this visit.       Diagnostic Tests:  n/a   Impression:  The patient is overall doing quite well from verbal report.    Plan:  Continue to increase ambulation per usual protocols.  Continue current lifting restrictions.  He may continue use the magnesium spray for muscle cramps as needed.  We plan to see him in the office when some of the Covid restrictions have decreased.  I made no changes to his current medication regimen.    I discussed limitations of evaluation and management via telephone.  The patient was advised to call back or seek an in-person evaluation if the patient's clinical condition changes in any significant manner.  I spent in excess of 15 minutes of non-face-to-face time during the conduct of this telephone virtual office consultation.  Level 1  (99441)             5-10 minutes Level 2  (99442)            11-20 minutes Level 3  (99443)            21-30 minutes   Rowe ClackWayne E Sharian Delia, PA-C 12/07/2018 9:14 AM

## 2018-12-07 NOTE — Telephone Encounter (Signed)
Lmvm-please change appt time or switch to Thursday am

## 2018-12-07 NOTE — Telephone Encounter (Signed)
need to change appt time please change appt time to [phone] for video visit

## 2018-12-07 NOTE — Telephone Encounter (Signed)
[name] is not available at 8am   she has a meeting, reschedule to another time please

## 2018-12-08 ENCOUNTER — Telehealth: Payer: Medicare Other | Admitting: Adult Health

## 2018-12-08 NOTE — Telephone Encounter (Signed)
Not needed at this time will cb and schedule if needed has appt with [facility]

## 2018-12-13 ENCOUNTER — Other Ambulatory Visit: Payer: Self-pay

## 2018-12-13 MED ORDER — METOPROLOL TARTRATE 25 MG PO TABS: 12.5000 mg | ORAL_TABLET | Freq: Two times a day (BID) | ORAL | 3 refills | Status: DC

## 2018-12-13 MED ORDER — ATORVASTATIN CALCIUM 80 MG PO TABS: 80.0000 mg | ORAL_TABLET | Freq: Every day | ORAL | 1 refills | Status: DC

## 2018-12-14 ENCOUNTER — Telehealth: Payer: Self-pay

## 2018-12-14 NOTE — Telephone Encounter (Signed)
Left detailed message to make sure that pt has stopped Amiodarone, I have failed on multiple attempts to contact pt to pass on message below: Dr Royann Shivers would like for you to stop the amiodarone on April 12th or so. We did refill it today so please stop on the 12th. If you have any questions feel free to call and we can explain this further.  Thank you!

## 2018-12-15 ENCOUNTER — Other Ambulatory Visit: Payer: Self-pay

## 2018-12-15 MED ORDER — ATORVASTATIN CALCIUM 80 MG PO TABS: 80.0000 mg | ORAL_TABLET | Freq: Every day | ORAL | 3 refills | Status: DC

## 2018-12-15 MED ORDER — METOPROLOL TARTRATE 25 MG PO TABS: 12.5000 mg | ORAL_TABLET | Freq: Two times a day (BID) | ORAL | 3 refills | Status: DC

## 2018-12-15 NOTE — Telephone Encounter (Signed)
[id number] 

## 2018-12-20 ENCOUNTER — Ambulatory Visit: Payer: Medicare Other

## 2019-01-03 ENCOUNTER — Telehealth: Payer: Self-pay

## 2019-01-03 MED ORDER — AMIODARONE HCL 200 MG PO TABS: 200.0000 mg | ORAL_TABLET | Freq: Two times a day (BID) | ORAL | 1 refills | Status: DC

## 2019-01-03 NOTE — Telephone Encounter (Signed)
Returned call to pt he states that he tried to refill his amiodarone and was told the rx was cancelled. I do not know why this was done, it was refilled by someone outside our department and I do not know why they cancelledthis. I have sent his refill for this he is requesting 90 days. Refill sent.

## 2019-01-06 ENCOUNTER — Telehealth: Payer: Self-pay | Admitting: Cardiovascular Disease

## 2019-01-06 NOTE — Telephone Encounter (Signed)
Returned call to [facility] at Cardiac Rehab at [facility] I faxed order.She stated they just received it.

## 2019-01-06 NOTE — Telephone Encounter (Signed)
New Message:     Pt is coming in for Rehab today,need his orders signed. Said she had already faxed one over and is faxing another one right away.

## 2019-01-21 ENCOUNTER — Other Ambulatory Visit: Payer: Self-pay | Admitting: Cardiothoracic Surgery

## 2019-01-21 DIAGNOSIS — Z951 Presence of aortocoronary bypass graft: Secondary | ICD-10-CM

## 2019-01-25 ENCOUNTER — Ambulatory Visit
Admission: RE | Admit: 2019-01-25 | Discharge: 2019-01-25 | Disposition: A | Discharge status: Home / Self Care | Payer: Medicare Other | Source: Ambulatory Visit | Attending: Cardiothoracic Surgery | Admitting: Cardiothoracic Surgery

## 2019-01-25 ENCOUNTER — Ambulatory Visit (INDEPENDENT_AMBULATORY_CARE_PROVIDER_SITE_OTHER): Payer: Self-pay | Admitting: Surgical

## 2019-01-25 ENCOUNTER — Other Ambulatory Visit: Payer: Self-pay

## 2019-01-25 VITALS — BP 115/82 | HR 69 | Temp 98.6°F | Resp 16 | Ht 66.0 in | Wt 205.0 lb

## 2019-01-25 DIAGNOSIS — Z951 Presence of aortocoronary bypass graft: Secondary | ICD-10-CM

## 2019-01-25 DIAGNOSIS — I2510 Atherosclerotic heart disease of native coronary artery without angina pectoris: Secondary | ICD-10-CM

## 2019-01-25 NOTE — Progress Notes (Signed)
Thanks, [place]

## 2019-01-25 NOTE — Patient Instructions (Signed)
As discussed

## 2019-01-25 NOTE — Progress Notes (Signed)
[address]       [place] [address]             [phone]      C[place] [place] [facility] Medical Record #[id number] Date of Birth: 10-31-1960  Referring: Thurmon Fair, MD Primary Care: Marlowe-Rogers, Hidi, MD Primary Cardiologist: Thurmon Fair, MD   Chief Complaint:   POST OP FOLLOW UP  History of Present Illness:      OPERATIVE REPORT  DATE OF PROCEDURE:  11/10/2018  PREOPERATIVE DIAGNOSIS:  Symptomatic coronary occlusive disease with unstable angina.  POSTOPERATIVE DIAGNOSIS:  Symptomatic coronary occlusive disease with unstable angina.  SURGICAL PROCEDURE:  Coronary artery bypass grafting x2 with the left internal mammary to the left anterior descending coronary artery, reverse saphenous vein graft to the intermediate coronary artery with right thigh greater saphenous endoscopic vein  harvesting.  SURGEON:  Sheliah Plane, MD  FIRST ASSISTANT:  Doree Fudge, PA.   The patient is a 58 year old male status post the above described procedure seen in the office on today's date for follow-up .  He does describe some sternotomy pain and some chest discomfort in the region of the left mammary artery that is slowly improving with time.  He also describes some mild dyspnea on exertion at times.  Overall he is making what he feels to be good progress and is tolerating a cardiac rehab program.    Past Medical History:  Diagnosis Date  . Coronary artery disease   . Diabetes mellitus without complication (HCC)   . Fatty liver   . Hyperlipidemia   . Stroke [facility])      Social History   Tobacco Use  Smoking Status Former Smoker  . Packs/day: 1.00  . Years: 12.00  . Pack years: 12.00  . Types: Cigarettes  . Start date: 27  . Last attempt to quit: 1981  . Years since quitting: 39.4  Smokeless Tobacco Current User  . Types: Chew    Social History   Substance and Sexual Activity  Alcohol Use Yes   Comment: rare social  couple of times a year     Allergies  Allergen Reactions  . Bee Venom Swelling  . Lisinopril Nausea And Vomiting and Cough    Coughing, sweating, sneezing    Current Outpatient Medications  Medication Sig Dispense Refill  . ACCU-CHEK AVIVA PLUS test strip     . albuterol (PROVENTIL HFA;VENTOLIN HFA) 108 (90 Base) MCG/ACT inhaler Inhale 2 puffs into the lungs every 6 (six) hours as needed for wheezing or shortness of breath.     . allopurinol (ZYLOPRIM) 300 MG tablet Take 300 mg by mouth daily.    Marland Kitchen amiodarone (PACERONE) 200 MG tablet Take 1 tablet (200 mg total) by mouth 2 (two) times daily. For 2 weeks then take 200 mg daily thereafter 180 tablet 1  . aspirin EC 325 MG tablet Take 1 tablet (325 mg total) by mouth daily.    Marland Kitchen atorvastatin (LIPITOR) 80 MG tablet Take 1 tablet (80 mg total) by mouth daily at 6 PM. 90 tablet 3  . colchicine 0.6 MG tablet Take 1 tablet (0.6 mg total) by mouth daily. 30 tablet 0  . cyclobenzaprine (FLEXERIL) 10 MG tablet Take 10 mg by mouth 2 (two) times daily as needed for muscle spasms.    . diphenhydrAMINE (BENADRYL) 25 MG tablet Take 25 mg by mouth daily as needed for itching.     . fluticasone (FLONASE) 50 MCG/ACT nasal spray Place  1 spray into both nostrils daily as needed for allergies or rhinitis.    . furosemide (LASIX) 40 MG tablet Take 1 tablet (40 mg total) by mouth daily. For 5 days then stop. (Patient not taking: Reported on 11/30/2018) 5 tablet 0  . gabapentin (NEURONTIN) 400 MG capsule Take 800 mg by mouth 3 (three) times daily.    Marland Kitchen glipiZIDE (GLUCOTROL) 5 MG tablet Take 5 mg by mouth daily.    Marland Kitchen guaiFENesin (MUCINEX) 600 MG 12 hr tablet Take 1 tablet (600 mg total) by mouth 2 (two) times daily as needed for cough.    . insulin lispro protamine-lispro (HUMALOG 75/25 MIX) (75-25) 100 UNIT/ML SUSP injection Inject 20-30 Units into the skin See admin instructions. Inject 40 units in am and 20 units bedtime.    [name] Devices (ADJUSTABLE LANCING  DEVICE) MISC     . metFORMIN (GLUCOPHAGE) 1000 MG tablet Take 1,000 mg by mouth 2 (two) times daily with a meal.     . metoprolol tartrate (LOPRESSOR) 25 MG tablet Take 0.5 tablets (12.5 mg total) by mouth 2 (two) times daily. 30 tablet 3  . Multiple Vitamins-Minerals (MULTIVITAMIN WITH MINERALS) tablet Take 1 tablet by mouth 2 (two) times a week.     Marland Kitchen omeprazole (PRILOSEC) 20 MG capsule Take 40 mg by mouth daily.     Marland Kitchen oxyCODONE (OXY IR/ROXICODONE) 5 MG immediate release tablet Take 1 tablet (5 mg total) by mouth every 6 (six) hours as needed for severe pain. 28 tablet 0  . potassium chloride SA (K-DUR,KLOR-CON) 20 MEQ tablet Take 1 tablet (20 mEq total) by mouth daily. For 5 days then stop. (Patient not taking: Reported on 11/30/2018) 5 tablet 0  . saw palmetto 160 MG capsule Take 160 mg by mouth daily.     No current facility-administered medications for this visit.        Physical Exam: There were no vitals taken for this visit.  General appearance: alert, cooperative and no distress Heart: regular rate and rhythm Lungs: clear to auscultation bilaterally Abdomen: benign exam Extremities: no edema Wound: incis healing well   Diagnostic Studies & Laboratory data:     Recent Radiology Findings:   Dg Chest 2 View  Result Date: 01/25/2019 CLINICAL DATA:  Left chest soreness.  History of CABG. EXAM: CHEST - 2 VIEW COMPARISON:  11/18/2018 FINDINGS: Prior CABG. Lower thoracic spondylosis. Likely postoperative findings at the left [facility] joint The previous small pleural effusions have cleared. Low lung volumes are present, but the lungs appear otherwise clear. Heart size within normal limits. IMPRESSION: 1. Low lung volumes are present, causing crowding of the pulmonary vasculature. 2. Prior CABG. 3.  No active cardiopulmonary disease is radiographically apparent. Electronically Signed   By: Gaylyn Rong M.D.   On: 01/25/2019 12:59      Recent Lab Findings: Lab Results  Component  Value Date   WBC 10.7 (H) 11/16/2018   HGB 11.3 (L) 11/16/2018   HCT 34.5 (L) 11/16/2018   PLT 191 11/16/2018   GLUCOSE 142 (H) 11/16/2018   CHOL 228 (H) 11/05/2018   TRIG 587 (HH) 11/05/2018   HDL 33 (L) 11/05/2018   LDLCALC Comment 11/05/2018   ALT 44 11/09/2018   AST 40 11/09/2018   NA 134 (L) 11/16/2018   K 3.8 11/16/2018   CL 97 (L) 11/16/2018   CREATININE 1.23 11/16/2018   BUN 18 11/16/2018   CO2 27 11/16/2018   INR 1.2 11/10/2018   HGBA1C 11.8 (H) 11/10/2018  Assessment / Plan: The patient is doing well now a little over 2 months status post his CABG x2.  He does have some sternotomy discomfort that persists but this appears to be quite tolerable and is improving slowly over time.  He does have some shortness of breath which is mostly with exertion and also relatively mild.  We discussed activity progression and when he should be able to return to work.  He does not have to do significant heavy lifting but does have to do some pushing and pulling at times as he works on [employer].  He did have postop atrial fibrillation but but currently is in a sinus rhythm by exam and he has no significant palpitations.  I will stop his amiodarone at this time but it is also uncertain if he is even taking it.  We will continue his low-dose metoprolol at current dosing.  We will see him again on a as needed basis for any surgically related issues or at request.      Medication Changes: No orders of the defined types were placed in this encounter.     Rowe ClackWayne E Chrysten Woulfe, PA-C 01/25/2019 1:10 PM

## 2019-02-10 ENCOUNTER — Other Ambulatory Visit: Payer: Self-pay | Admitting: Adult Health

## 2019-02-10 MED ORDER — METOPROLOL TARTRATE 25 MG PO TABS: 12.5000 mg | ORAL_TABLET | Freq: Two times a day (BID) | ORAL | 2 refills | Status: DC

## 2019-02-10 NOTE — Telephone Encounter (Signed)
°*  STAT* If patient is at the pharmacy, call can be transferred to refill team.   1. Which medications need to be refilled? (please list name of each medication and dose if known) metoprolol tartrate (LOPRESSOR) 25 MG tablet  2. Which pharmacy/location (including street and city if local pharmacy) is medication to be sent to? [place], [place]  3. Do they need a 30 day or 90 day supply? 90 days

## 2019-02-10 NOTE — Telephone Encounter (Signed)
Rx has been sent to the pharmacy electronically. ° °

## 2019-03-16 ENCOUNTER — Other Ambulatory Visit: Payer: Self-pay

## 2019-03-16 MED ORDER — METOPROLOL TARTRATE 25 MG PO TABS: 12.5000 mg | ORAL_TABLET | Freq: Two times a day (BID) | ORAL | 2 refills | Status: DC

## 2019-03-16 MED ORDER — ATORVASTATIN CALCIUM 80 MG PO TABS: 80.0000 mg | ORAL_TABLET | Freq: Every day | ORAL | 3 refills | Status: DC

## 2019-03-16 MED ORDER — AMIODARONE HCL 200 MG PO TABS: 200.0000 mg | ORAL_TABLET | Freq: Two times a day (BID) | ORAL | 1 refills | Status: DC

## 2019-04-08 ENCOUNTER — Telehealth: Payer: Self-pay | Admitting: Adult Health

## 2019-04-08 NOTE — Telephone Encounter (Signed)
New Message   Patient admitted into hospital.

## 2019-04-11 NOTE — Telephone Encounter (Signed)
FYI, I didn't see it in the [facility] system.

## 2019-04-13 NOTE — Telephone Encounter (Signed)
Left a message for the patient to call back.  

## 2019-04-13 NOTE — Telephone Encounter (Signed)
If you get a minute... Can you find out where he was admitted and why?

## 2019-04-14 NOTE — Telephone Encounter (Signed)
The patient has been hospitalized at [facility]. He had gained 12 pounds in 2 days. He is currently being diuresed and should go home today or tomorrow.   The wife has been informed that I will call her next week to set up a follow appointment with Dr. Sallyanne Kuster.

## 2019-04-22 NOTE — Telephone Encounter (Signed)
Appointment on 05/04/2019

## 2019-05-04 ENCOUNTER — Other Ambulatory Visit: Payer: Self-pay

## 2019-05-04 ENCOUNTER — Ambulatory Visit (INDEPENDENT_AMBULATORY_CARE_PROVIDER_SITE_OTHER): Payer: Medicare Other | Admitting: Cardiology

## 2019-05-04 ENCOUNTER — Encounter: Payer: Self-pay | Admitting: Cardiology

## 2019-05-04 DIAGNOSIS — R0609 Other forms of dyspnea: Secondary | ICD-10-CM | POA: Insufficient documentation

## 2019-05-04 DIAGNOSIS — I480 Paroxysmal atrial fibrillation: Secondary | ICD-10-CM

## 2019-05-04 DIAGNOSIS — R079 Chest pain, unspecified: Secondary | ICD-10-CM

## 2019-05-04 DIAGNOSIS — Z951 Presence of aortocoronary bypass graft: Secondary | ICD-10-CM | POA: Diagnosis not present

## 2019-05-04 DIAGNOSIS — I10 Essential (primary) hypertension: Secondary | ICD-10-CM

## 2019-05-04 DIAGNOSIS — I5033 Acute on chronic diastolic (congestive) heart failure: Secondary | ICD-10-CM

## 2019-05-04 DIAGNOSIS — R0600 Dyspnea, unspecified: Secondary | ICD-10-CM

## 2019-05-04 DIAGNOSIS — E1165 Type 2 diabetes mellitus with hyperglycemia: Secondary | ICD-10-CM

## 2019-05-04 DIAGNOSIS — G4733 Obstructive sleep apnea (adult) (pediatric): Secondary | ICD-10-CM

## 2019-05-04 MED ORDER — NAPROXEN 500 MG PO TABS: 500.0000 mg | ORAL_TABLET | Freq: Two times a day (BID) | ORAL | 0 refills | Status: AC

## 2019-05-04 NOTE — Assessment & Plan Note (Signed)
Post op- discharged on Amiodarone, this has been discontniued

## 2019-05-04 NOTE — Assessment & Plan Note (Signed)
Atypical, sharp, grabbing chest pain

## 2019-05-04 NOTE — Patient Instructions (Addendum)
Medication Instructions:  START Naprosyn 500mg  Take 1 tablet twice a day for 7 days INCREASE Lasix to 80mg ( two tablets) once a day for 3 days , then go back to normal dose of 40mg  once a day If you need a refill on your cardiac medications before your next appointment, please call your pharmacy.   Lab work: None  If you have labs (blood work) drawn today and your tests are completely normal, you will receive your results only by: Marland Kitchen MyChart Message (if you have MyChart) OR . A paper copy in the mail If you have any lab test that is abnormal or we need to change your treatment, we will call you to review the results.  Testing/Procedures: None   Follow-Up: At [facility], you and your health needs are our priority.  As part of our continuing mission to provide you with exceptional heart care, we have created designated Provider Care Teams.  These Care Teams include your primary Cardiologist (physician) and Advanced Practice Providers (APPs -  Physician Assistants and Nurse Practitioners) who all work together to provide you with the care you need, when you need it. You will need a follow up appointment in 2 months.  Please call our office 2 months in advance to schedule this appointment.  You may see Sanda Klein, MD or one of the following Advanced Practice Providers on your designated Care Team: [place], [place] . Fabian Sharp, PA-C  Your physician recommends that you schedule a follow-up appointment in: 2-3 weeks with Kerin Ransom, PA-C  Any Other Special Instructions Will Be Listed Below (If Applicable).

## 2019-05-04 NOTE — Progress Notes (Signed)
Cardiology Office Note:    Date:  05/04/2019   ID:  Eric [name], DOB 09/24/1960, MRN [id number]  PCP:  Rudi Coco, MD  Cardiologist:  Thurmon Fair, MD  Electrophysiologist:  None   Referring MD: Rudi Coco, MD   Chief Complaint  Patient presents with  . Other    Patient c/o chest pain and SOB. Meds reviewed verbally with patient.     History of Present Illness:    Eric [name] is a 58 y.o. male with a hx of diabetes and coronary disease.  He was originally evaluated in September 2019 for chest pain.  He had a negative stress test at his PCPs office.  Echocardiogram was unremarkable.  He had recurrent chest pain and a coronary CTA showed LAD disease.  He was admitted for diagnostic catheterization in March 2020 he had bypass grafting x2 with an LIMA to LAD and SVG to the ramus intermedius.  His postoperative course was complicated by PAF.  He was discharged on amiodarone with the intention of stopping and after a few weeks.  He was discharged at the beginning of the COVID pandemic.  He was seen in follow-up virtually.  The patient did well initially after his surgery but then started to complain of localized left chest pain.  A trial of colchicine was called in but I believe it conflicted with the amiodarone as well as being prohibitively expensive.  Patient never actually returned to the cardiology office for follow-up after his CABG.  About 3 weeks ago he was admitted to [facility] after he gained "12 pounds" in cardiac rehab.  He was complaining of dyspnea on exertion.  He was sent from cardiac rehab at [facility] to the emergency room and admitted.  I do not have all the records.  The patient says he was 12 pounds above his usual weight.  Echocardiogram done at [facility] showed preserved LV function with an EF of 60 to 65%.  LV filling pattern was normal.  There was mild concentric LVH.  Myoview was negative for ischemia.  Chest CTA showed no aortic  dissection, there was no mention of pulmonary embolism.  The patient was to follow-up with a cardiologist at [facility] but his wife wanted him to follow-up with Dr.Croitoru.  He is in the office today with complaints of chest pain and dyspnea on exertion.  While I was examining him he had a grabbing left local chest pain.  When I palpated his chest he was exquisitely tender in the left side of his chest.  There is no edema and his lungs were clear.  He was discharged on Lasix 40 mg.  Aldactone 25 mg was also added at [facility].  Patient was somewhat agitated and upset that he was not seeing Dr. Royann Shivers.  Past Medical History:  Diagnosis Date  . Coronary artery disease   . Diabetes mellitus without complication (HCC)   . Fatty liver   . Hyperlipidemia   . Stroke [facility])     Past Surgical History:  Procedure Laterality Date  . CHOLECYSTECTOMY    . CORONARY ARTERY BYPASS GRAFT N/A 11/10/2018   Procedure: CORONARY ARTERY BYPASS GRAFTING (CABG) x Two , using left internal mammary artery and right leg greater saphenous vein harvested endoscopically - SVG to Ramus, LIMA to LAD;  Surgeon: Delight Ovens, MD;  Location: [facility] OR;  Service: Open Heart Surgery;  Laterality: N/A;  . LEFT HEART CATH AND CORONARY ANGIOGRAPHY N/A 11/09/2018   Procedure: LEFT  HEART CATH AND CORONARY ANGIOGRAPHY;  Surgeon: Lennette BihariKelly, Thomas A, MD;  [facility];  Service: Cardiovascular;  Laterality: N/A;  . SHOULDER SURGERY Left   . TEE WITHOUT CARDIOVERSION N/A 11/10/2018   Procedure: TRANSESOPHAGEAL ECHOCARDIOGRAM (TEE);  Surgeon: Delight OvensGerhardt, Edward B, MD;  [facility];  Service: Open Heart Surgery;  Laterality: N/A;    Current Medications: Current Meds  Medication Sig  . ACCU-CHEK AVIVA PLUS test strip   . allopurinol (ZYLOPRIM) 300 MG tablet Take 300 mg by mouth daily.  Marland Kitchen. aspirin EC 81 MG tablet Take 81 mg by mouth daily.  Marland Kitchen. atorvastatin (LIPITOR) 80 MG tablet Take 1 tablet (80 mg total) by mouth  daily at 6 PM.  . diphenhydrAMINE (BENADRYL) 25 MG tablet Take 25 mg by mouth daily as needed for itching.   . furosemide (LASIX) 40 MG tablet Take 40 mg by mouth daily.  Marland Kitchen. gabapentin (NEURONTIN) 400 MG capsule Take 800 mg by mouth 3 (three) times daily.  Marland Kitchen. glipiZIDE (GLUCOTROL) 5 MG tablet Take 5 mg by mouth daily.  Marland Kitchen. guaiFENesin (MUCINEX) 600 MG 12 hr tablet Take 1 tablet (600 mg total) by mouth 2 (two) times daily as needed for cough.  Marland Kitchen. HYDROcodone-acetaminophen (NORCO/VICODIN) 5-325 MG tablet Take by mouth.  . insulin lispro protamine-lispro (HUMALOG 75/25 MIX) (75-25) 100 UNIT/ML SUSP injection Inject 20-30 Units into the skin See admin instructions. Inject 40 units in am and 20 units bedtime.  [name]. Lancet Devices (ADJUSTABLE LANCING DEVICE) MISC   . metFORMIN (GLUCOPHAGE) 1000 MG tablet Take 1,000 mg by mouth 2 (two) times daily with a meal.   . metoprolol tartrate (LOPRESSOR) 25 MG tablet Take 0.5 tablets (12.5 mg total) by mouth 2 (two) times daily.  . Multiple Vitamins-Minerals (MULTIVITAMIN WITH MINERALS) tablet Take 1 tablet by mouth 2 (two) times a week.   Marland Kitchen. omeprazole (PRILOSEC) 20 MG capsule Take 40 mg by mouth daily.   Marland Kitchen. oxyCODONE (OXY IR/ROXICODONE) 5 MG immediate release tablet Take 1 tablet (5 mg total) by mouth every 6 (six) hours as needed for severe pain.  . saw palmetto 160 MG capsule Take 160 mg by mouth daily.  Marland Kitchen. spironolactone (ALDACTONE) 25 MG tablet TAKE 1 TABLET BY MOUTH ONCE DAILY FOR 30 DAYS  . [DISCONTINUED] aspirin EC 325 MG tablet Take 1 tablet (325 mg total) by mouth daily. (Patient taking differently: Take 325 mg by mouth daily. TAKES 81 MG)     Allergies:   Bee venom and Lisinopril   Social History   Socioeconomic History  . Marital status: Married    Spouse name: Luanne BrasLizzie   . Number of children: 3  . Years of education: Not on file  . Highest education level: Not on file  Occupational History  . Occupation: disability   Social Needs  . Financial  resource strain: Not very hard  . Food insecurity    Worry: Never true    Inability: Never true  . Transportation needs    Medical: No    Non-medical: No  Tobacco Use  . Smoking status: Former Smoker    Packs/day: 1.00    Years: 12.00    Pack years: 12.00    Types: Cigarettes    Start date: 1968    Quit date: 1981    Years since quitting: 39.6  . Smokeless tobacco: Current User    Types: Chew  Substance and Sexual Activity  . Alcohol use: Yes    Comment: rare social couple of times  a year  . Drug use: Never  . Sexual activity: Not on file  Lifestyle  . Physical activity    Days per week: 0 days    Minutes per session: 0 min  . Stress: Not at all  Relationships  . Social connections    Talks on phone: More than three times a week    Gets together: More than three times a week    Attends religious service: More than 4 times per year    Active member of club or organization: No    Attends meetings of clubs or organizations: Never    Relationship status: Married  Other Topics Concern  . Not on file  Social History Narrative   Smoked from 1st grade to 12th grade then quit     Family History: The patient's family history includes Heart attack in his father; Heart failure in his father; Hyperlipidemia in his mother; Hypothyroidism in his mother.  ROS:   Please see the history of present illness.     All other systems reviewed and are negative.  EKGs/Labs/Other Studies Reviewed:    The following studies were reviewed today: Echo [facility] Aug 2020 [place] Aug 2020 CTA [place] Aug 2020  EKG:  EKG is ordered today.  The ekg ordered today demonstrates NSR  Recent Labs: 11/09/2018: ALT 44 11/11/2018: Magnesium 2.2 11/16/2018: BUN 18; Creatinine, Ser 1.23; Hemoglobin 11.3; Platelets 191; Potassium 3.8; Sodium 134  Recent Lipid Panel    Component Value Date/Time   CHOL 228 (H) 11/05/2018 1607   TRIG 587 (HH) 11/05/2018 1607   HDL 33 (L) 11/05/2018 1607   CHOLHDL 6.9 (H)  11/05/2018 1607   LDLCALC Comment 11/05/2018 1607    Physical Exam:    VS:  BP 118/88 (BP Location: Left Arm, Patient Position: Sitting, Cuff Size: Normal)   Pulse 67   Ht 5' 6.5" (1.689 m)   Wt 210 lb 6 oz (95.4 kg)   BMI 33.45 kg/m     Wt Readings from Last 3 Encounters:  05/04/19 210 lb 6 oz (95.4 kg)  01/25/19 205 lb (93 kg)  11/30/18 187 lb (84.8 kg)     GEN: Overweight male, well developed in no acute distress HEENT: Normal NECK: No JVD; No carotid bruits LYMPHATICS: No lymphadenopathy CARDIAC: RRR, no murmurs, rubs, gallops RESPIRATORY:  Clear to auscultation without rales, wheezing or rhonchi  ABDOMEN: Soft, non-tender, non-distended MUSCULOSKELETAL:  No edema; No deformity  SKIN: Warm and dry NEUROLOGIC:  Alert and oriented x 3 PSYCHIATRIC:  Normal affect   ASSESSMENT:    Chest pain Atypical, sharp, grabbing chest pain  DOE (dyspnea on exertion) Pt says he did well post CABG until the last few months when he notes increased DOE  Hx of CABG CABG x 2 with LIMA-LAD, SVG-RI 11/10/2018  PAF- transient post CABG Post op- discharged on Amiodarone, this has been discontniued  Uncontrolled type 2 diabetes mellitus with hyperglycemia ([place]) He just saw an endocrinologist last week  OSA (obstructive sleep apnea) On C-pap  Essential hypertension Controlled  Acute on chronic diastolic (congestive) heart failure ([place]) Admitted to [facility] three weeks ago- Echo and Myoview unremarkable  PLAN:    Trial of Naprosyn 500 mg BID.  Increase Lasix to 80 mg QD x 3 days.  F/U 2-3 weeks.   Medication Adjustments/Labs and Tests Ordered: Current medicines are reviewed at length with the patient today.  Concerns regarding medicines are outlined above.  Orders Placed This Encounter  Procedures  . EKG  12-Lead   Meds ordered this encounter  Medications  . naproxen (NAPROSYN) 500 MG tablet    Sig: Take 1 tablet (500 mg total) by mouth 2 (two) times daily with a meal for 7  days.    Dispense:  14 tablet    Refill:  0    Patient Instructions  Medication Instructions:  START Naprosyn 500mg  Take 1 tablet twice a day for 7 days INCREASE Lasix to 80mg ( two tablets) once a day for 3 days , then go back to normal dose of 40mg  once a day If you need a refill on your cardiac medications before your next appointment, please call your pharmacy.   Lab work: None  If you have labs (blood work) drawn today and your tests are completely normal, you will receive your results only by: Marland Kitchen. MyChart Message (if you have MyChart) OR . A paper copy in the mail If you have any lab test that is abnormal or we need to change your treatment, we will call you to review the results.  Testing/Procedures: None   Follow-Up: At [facility], you and your health needs are our priority.  As part of our continuing mission to provide you with exceptional heart care, we have created designated Provider Care Teams.  These Care Teams include your primary Cardiologist (physician) and Advanced Practice Providers (APPs -  Physician Assistants and Nurse Practitioners) who all work together to provide you with the care you need, when you need it. You will need a follow up appointment in 2 months.  Please call our office 2 months in advance to schedule this appointment.  You may see Thurmon FairMihai Croitoru, MD or one of the following Advanced Practice Providers on your designated Care Team: [place]ao Meng, [place] JerseyPA-C . Micah FlesherAngela Duke, PA-C  Your physician recommends that you schedule a follow-up appointment in: 2-3 weeks with Corine ShelterLuke Miata Culbreth, PA-C  Any Other Special Instructions Will Be Listed Below (If Applicable).      Jolene ProvostSigned, Sephora Boyar, PA-C  05/04/2019 4:50 PM    [facility]

## 2019-05-04 NOTE — Assessment & Plan Note (Signed)
CABG x 2 with LIMA-LAD, SVG-RI 11/10/2018

## 2019-05-04 NOTE — Assessment & Plan Note (Signed)
Pt says he did well post CABG until the last few months when he notes increased DOE

## 2019-05-04 NOTE — Assessment & Plan Note (Signed)
Admitted to [facility] three weeks ago- Echo and Myoview unremarkable

## 2019-05-04 NOTE — Progress Notes (Signed)
Can we please find a slot for him to see me next time?

## 2019-05-04 NOTE — Assessment & Plan Note (Signed)
On C-pap 

## 2019-05-04 NOTE — Assessment & Plan Note (Signed)
Controlled.  

## 2019-05-04 NOTE — Assessment & Plan Note (Signed)
He just saw an endocrinologist last week

## 2019-05-27 ENCOUNTER — Telehealth (INDEPENDENT_AMBULATORY_CARE_PROVIDER_SITE_OTHER): Payer: Medicare Other | Admitting: Cardiology

## 2019-05-27 ENCOUNTER — Telehealth: Payer: Self-pay

## 2019-05-27 ENCOUNTER — Encounter: Payer: Self-pay | Admitting: Cardiology

## 2019-05-27 VITALS — BP 123/80 | HR 80 | Ht 66.5 in | Wt 205.0 lb

## 2019-05-27 DIAGNOSIS — I5033 Acute on chronic diastolic (congestive) heart failure: Secondary | ICD-10-CM

## 2019-05-27 DIAGNOSIS — R079 Chest pain, unspecified: Secondary | ICD-10-CM

## 2019-05-27 DIAGNOSIS — R0609 Other forms of dyspnea: Secondary | ICD-10-CM | POA: Diagnosis not present

## 2019-05-27 DIAGNOSIS — I10 Essential (primary) hypertension: Secondary | ICD-10-CM

## 2019-05-27 DIAGNOSIS — Z951 Presence of aortocoronary bypass graft: Secondary | ICD-10-CM

## 2019-05-27 DIAGNOSIS — R0600 Dyspnea, unspecified: Secondary | ICD-10-CM

## 2019-05-27 DIAGNOSIS — I480 Paroxysmal atrial fibrillation: Secondary | ICD-10-CM

## 2019-05-27 NOTE — Patient Instructions (Signed)
Medication Instructions:  Your physician recommends that you continue on your current medications as directed. Please refer to the Current Medication list given to you today. If you need a refill on your cardiac medications before your next appointment, please call your pharmacy.   Lab work: NONE  If you have labs (blood work) drawn today and your tests are completely normal, you will receive your results only by: Marland Kitchen MyChart Message (if you have MyChart) OR . A paper copy in the mail If you have any lab test that is abnormal or we need to change your treatment, we will call you to review the results.  Testing/Procedures: NONE   Follow-Up: At [facility], you and your health needs are our priority.  As part of our continuing mission to provide you with exceptional heart care, we have created designated Provider Care Teams.  These Care Teams include your primary Cardiologist (physician) and Advanced Practice Providers (APPs -  Physician Assistants and Nurse Practitioners) who all work together to provide you with the care you need, when you need it. Marland Kitchen LUKE RECOMMENDS YOU FOLLOW UP AS SCHEDULE WITH DR [id number].   Any Other Special Instructions Will Be Listed Below (If Applicable).

## 2019-05-27 NOTE — Progress Notes (Signed)
Virtual Visit via Telephone Note   This visit type was conducted due to national recommendations for restrictions regarding the COVID-19 Pandemic (e.g. social distancing) in an effort to limit this patient's exposure and mitigate transmission in our community.  Due to his co-morbid illnesses, this patient is at least at moderate risk for complications without adequate follow up.  This format is felt to be most appropriate for this patient at this time.  The patient did not have access to video technology/had technical difficulties with video requiring transitioning to audio format only (telephone).  All issues noted in this document were discussed and addressed.  No physical exam could be performed with this format.  Please refer to the patient's chart for his  consent to telehealth for [facility].   Date:  05/27/2019   ID:  Eric [name], DOB [birth year], MRN [id number]  Patient Location: Home Provider Location: Office  PCP:  Rudi Coco, MD  Cardiologist:  Thurmon Fair, MD  Electrophysiologist:  None   Evaluation Performed:  Follow-Up Visit  Chief Complaint:  Dyspnea  History of Present Illness:    Eric [name] is a 58 y.o. male with a history of diabetes and coronary disease.  He was originally evaluated in September 2019 for chest pain.  He had a negative stress test at his PCPs office.  Echocardiogram was unremarkable.  He had recurrent chest pain and a coronary CTA showed LAD disease.  He was admitted for diagnostic catheterization in March 2020 he had bypass grafting x2 with an LIMA to LAD and SVG to the ramus intermedius.  His postoperative course was complicated by PAF.  He was discharged on amiodarone with the intention of stopping and after a few weeks.  He was discharged at the beginning of the COVID pandemic.  He was seen in follow-up virtually.  The patient did well initially after his surgery but then started to complain of localized left chest pain.  A trial  of colchicine was called in but I believe it conflicted with the amiodarone as well as being prohibitively expensive.  Patient never actually returned to the cardiology office for follow-up after his CABG.  In August 2020 he was admitted to [facility] after he gained "12 pounds" and complained of SOB while in cardiac rehab.  He was sent to the ED at [facility] and admitted.  I do not have all the records.  The patient says he was 12 pounds above his usual weight.  Echocardiogram done at [facility] showed preserved LV function with an EF of 60 to 65%.  LV filling pattern was normal.  There was mild concentric LVH.  Myoview was negative for ischemia.  Chest CTA showed no aortic dissection, there was no mention of pulmonary embolism. Aldactone was added at discharge.   The patient was to follow-up with a cardiologist at [facility] but his wife wanted him to follow-up with Dr.Croitoru.  He was seen in the office 05/04/2019  with complaints of chest pain and dyspnea on exertion.  While I was examining him he had an episode of grabbing left localized chest pain.  When I palpated his chest he was exquisitely tender on the left side of his chest.  There was no edema and his lungs were clear. I added Naprosyn 500 mg BID and increased his Lasix to 80 mg BID for 3 days.  He was contacted today by phone for follow up.  He is doing better, still some DOE.  His chest pain  is improving.  His PCP has checked a BMP.  He is on lasix 40 mg daily and Aldactone 25 mg daily.   The patient does not have symptoms concerning for COVID-19 infection (fever, chills, cough, or new shortness of breath).    Past Medical History:  Diagnosis Date  . Coronary artery disease   . Diabetes mellitus without complication ([place])   . Fatty liver   . Hyperlipidemia   . Stroke [facility])    Past Surgical History:  Procedure Laterality Date  . CHOLECYSTECTOMY    . CORONARY ARTERY BYPASS GRAFT N/A 11/10/2018   Procedure: CORONARY ARTERY  BYPASS GRAFTING (CABG) x Two , using left internal mammary artery and right leg greater saphenous vein harvested endoscopically - SVG to Ramus, LIMA to LAD;  Surgeon: Grace Isaac, MD;  Location: [place];  Service: Open Heart Surgery;  Laterality: N/A;  . LEFT HEART CATH AND CORONARY ANGIOGRAPHY N/A 11/09/2018   Procedure: LEFT HEART CATH AND CORONARY ANGIOGRAPHY;  Surgeon: Troy Sine, MD;  Location: [place] CV LAB;  Service: Cardiovascular;  Laterality: N/A;  . SHOULDER SURGERY Left   . TEE WITHOUT CARDIOVERSION N/A 11/10/2018   Procedure: TRANSESOPHAGEAL ECHOCARDIOGRAM (TEE);  Surgeon: Grace Isaac, MD;  Location: [place];  Service: Open Heart Surgery;  Laterality: N/A;     Current Meds  Medication Sig  . ACCU-CHEK AVIVA PLUS test strip   . allopurinol (ZYLOPRIM) 300 MG tablet Take 300 mg by mouth daily.  Marland Kitchen aspirin EC 81 MG tablet Take 81 mg by mouth daily.  Marland Kitchen atorvastatin (LIPITOR) 80 MG tablet Take 1 tablet (80 mg total) by mouth daily at 6 PM.  . diphenhydrAMINE (BENADRYL) 25 MG tablet Take 25 mg by mouth daily as needed for itching.   . furosemide (LASIX) 40 MG tablet Take 40 mg by mouth daily.  Marland Kitchen gabapentin (NEURONTIN) 400 MG capsule Take 800 mg by mouth 3 (three) times daily.  Marland Kitchen glipiZIDE (GLUCOTROL) 5 MG tablet Take 5 mg by mouth daily.  Marland Kitchen guaiFENesin (MUCINEX) 600 MG 12 hr tablet Take 1 tablet (600 mg total) by mouth 2 (two) times daily as needed for cough.  . insulin lispro protamine-lispro (HUMALOG 75/25 MIX) (75-25) 100 UNIT/ML SUSP injection Inject 20-30 Units into the skin See admin instructions. Inject 40 units in am and 20 units bedtime.  Elmore Guise Devices (ADJUSTABLE LANCING DEVICE) MISC   . metFORMIN (GLUCOPHAGE) 1000 MG tablet Take 1,000 mg by mouth 2 (two) times daily with a meal.   . metoprolol tartrate (LOPRESSOR) 25 MG tablet Take 0.5 tablets (12.5 mg total) by mouth 2 (two) times daily.  . Multiple Vitamins-Minerals (MULTIVITAMIN WITH MINERALS) tablet  Take 1 tablet by mouth 2 (two) times a week.   Marland Kitchen omeprazole (PRILOSEC) 20 MG capsule Take 40 mg by mouth daily.   Marland Kitchen oxyCODONE (OXY IR/ROXICODONE) 5 MG immediate release tablet Take 1 tablet (5 mg total) by mouth every 6 (six) hours as needed for severe pain.  . saw palmetto 160 MG capsule Take 160 mg by mouth daily.  Marland Kitchen spironolactone (ALDACTONE) 25 MG tablet TAKE 1 TABLET BY MOUTH ONCE DAILY FOR 30 DAYS     Allergies:   Bee venom and Lisinopril   Social History   Tobacco Use  . Smoking status: Former Smoker    Packs/day: 1.00    Years: 12.00    Pack years: 12.00    Types: Cigarettes    Start date: 1968    Quit date:  1981    Years since quitting: 39.7  . Smokeless tobacco: Current User    Types: Chew  Substance Use Topics  . Alcohol use: Yes    Comment: rare social couple of times a year  . Drug use: Never     Family Hx: The patient's family history includes Heart attack in his father; Heart failure in his father; Hyperlipidemia in his mother; Hypothyroidism in his mother.  ROS:   Please see the history of present illness.    All other systems reviewed and are negative.   Prior CV studies:   The following studies were reviewed today:    Labs/Other Tests and Data Reviewed:    EKG:  No ECG reviewed.  Recent Labs: 11/09/2018: ALT 44 11/11/2018: Magnesium 2.2 11/16/2018: BUN 18; Creatinine, Ser 1.23; Hemoglobin 11.3; Platelets 191; Potassium 3.8; Sodium 134   Recent Lipid Panel Lab Results  Component Value Date/Time   CHOL 228 (H) 11/05/2018 04:07 PM   TRIG 587 (HH) 11/05/2018 04:07 PM   HDL 33 (L) 11/05/2018 04:07 PM   CHOLHDL 6.9 (H) 11/05/2018 04:07 PM   LDLCALC Comment 11/05/2018 04:07 PM    Wt Readings from Last 3 Encounters:  05/27/19 205 lb (93 kg)  05/04/19 210 lb 6 oz (95.4 kg)  01/25/19 205 lb (93 kg)     Objective:    Vital Signs:  BP 123/80   Pulse 80   Ht 5' 6.5" (1.689 m)   Wt 205 lb (93 kg)   BMI 32.59 kg/m    VITAL SIGNS:  reviewed   ASSESSMENT & PLAN:    Chest pain Atypical, sharp, grabbing chest pain-improving after a course of NSAIDs and increased Lasix  DOE (dyspnea on exertion) Pt says he did well post CABG until the last few months when he noted increased DOE- echo, Myoview, and chest CT-WNL  Hx of CABG CABG x 2 with LIMA-LAD, SVG-RI 11/10/2018  PAF- transient post CABG Post op- discharged on Amiodarone, this has been discontniued  Uncontrolled type 2 diabetes mellitus with hyperglycemia (HCC) [facility]  OSA (obstructive sleep apnea) On C-pap  Essential hypertension Controlled  Acute on chronic diastolic (congestive) heart failure (HCC) [facility] Echo and Myoview unremarkable  COVID-19 Education: The signs and symptoms of COVID-19 were discussed with the patient and how to seek care for testing (follow up with PCP or arrange E-visit).  The importance of social distancing was discussed today.  Time:   Today, I have spent 15 minutes with the patient with telehealth technology discussing the above problems.     Medication Adjustments/Labs and Tests Ordered: Current medicines are reviewed at length with the patient today.  Concerns regarding medicines are outlined above.   Tests Ordered: No orders of the defined types were placed in this encounter.   Medication Changes: No orders of the defined types were placed in this encounter.   Follow Up:  In Person Keep appointnment with Dr Royann Shiversroitoru in Nov- no change in Rx.   Jolene ProvostSigned, Raliegh Scobie, PA-C  05/27/2019 4:07 PM    [facility]

## 2019-05-27 NOTE — Telephone Encounter (Signed)
Contacted patient to discuss AVS Instructions. Gave patient Luke's recommendations from today's virtual office visit. Patient voiced understanding and AVS mailed.    

## 2019-05-27 NOTE — Telephone Encounter (Signed)

## 2019-07-22 ENCOUNTER — Encounter: Payer: Self-pay | Admitting: *Deleted

## 2019-07-27 ENCOUNTER — Encounter: Payer: Self-pay | Admitting: Cardiovascular Disease

## 2019-07-27 ENCOUNTER — Telehealth: Payer: Self-pay | Admitting: *Deleted

## 2019-07-27 ENCOUNTER — Telehealth (INDEPENDENT_AMBULATORY_CARE_PROVIDER_SITE_OTHER): Payer: Medicare Other | Admitting: Cardiovascular Disease

## 2019-07-27 VITALS — BP 115/82 | HR 76 | Ht 66.0 in | Wt 206.0 lb

## 2019-07-27 DIAGNOSIS — I10 Essential (primary) hypertension: Secondary | ICD-10-CM

## 2019-07-27 DIAGNOSIS — I5033 Acute on chronic diastolic (congestive) heart failure: Secondary | ICD-10-CM | POA: Diagnosis not present

## 2019-07-27 DIAGNOSIS — E7800 Pure hypercholesterolemia, unspecified: Secondary | ICD-10-CM

## 2019-07-27 DIAGNOSIS — G4733 Obstructive sleep apnea (adult) (pediatric): Secondary | ICD-10-CM

## 2019-07-27 DIAGNOSIS — E669 Obesity, unspecified: Secondary | ICD-10-CM

## 2019-07-27 DIAGNOSIS — E782 Mixed hyperlipidemia: Secondary | ICD-10-CM

## 2019-07-27 DIAGNOSIS — E118 Type 2 diabetes mellitus with unspecified complications: Secondary | ICD-10-CM

## 2019-07-27 DIAGNOSIS — I25118 Atherosclerotic heart disease of native coronary artery with other forms of angina pectoris: Secondary | ICD-10-CM

## 2019-07-27 DIAGNOSIS — E1149 Type 2 diabetes mellitus with other diabetic neurological complication: Secondary | ICD-10-CM

## 2019-07-27 DIAGNOSIS — E1142 Type 2 diabetes mellitus with diabetic polyneuropathy: Secondary | ICD-10-CM

## 2019-07-27 DIAGNOSIS — E1165 Type 2 diabetes mellitus with hyperglycemia: Secondary | ICD-10-CM

## 2019-07-27 MED ORDER — SPIRONOLACTONE 25 MG PO TABS: 50.0000 mg | ORAL_TABLET | Freq: Every day | ORAL | 1 refills | Status: DC

## 2019-07-27 MED ORDER — FUROSEMIDE 40 MG PO TABS: 80.0000 mg | ORAL_TABLET | Freq: Every day | ORAL | 1 refills | Status: DC

## 2019-07-27 NOTE — Telephone Encounter (Signed)
The patient has been called about the virtual appointment today with Dr. Croitoru. Instructions provided. The AVS will be mailed. The patient verbalized their understanding.  

## 2019-07-27 NOTE — Progress Notes (Signed)
Virtual Visit via Video Note   This visit type was conducted due to national recommendations for restrictions regarding the COVID-19 Pandemic (e.g. social distancing) in an effort to limit this patient's exposure and mitigate transmission in our community.  Due to his co-morbid illnesses, this patient is at least at moderate risk for complications without adequate follow up.  This format is felt to be most appropriate for this patient at this time.  All issues noted in this document were discussed and addressed.  A limited physical exam was performed with this format.  Please refer to the patient's chart for his consent to telehealth for [facility].   Date:  07/27/2019   ID:  Eric [name], DOB 02-12-1961, MRN [id number]  Patient Location: Home Provider Location: Office  PCP:  Rudi Coco, MD  Cardiologist:  Thurmon Fair, MD  Electrophysiologist:  None   Evaluation Performed:  Follow-Up Visit  Chief Complaint:  CAD, CHF  History of Present Illness:    Eric [name] is a 58 y.o. male with coronary artery disease with previous bypass surgery in March 2020 (LIMA to LAD and SVG to ramus intermedius), chronic diastolic heart failure, poorly controlled insulin requiring type 2 diabetes mellitus, obesity, hypertension and severe mixed hyperlipidemia.  He had brief postoperative atrial fibrillation that has not recurred.  He was hospitalized at [facility] in August with about 12 pound weight gain that occurred while he was in cardiac rehab.  He improved with diuretic therapy.  His echocardiogram showed preserved LVEF and his Lexiscan Myoview did not show ischemia or scar.  In September he had a telemedicine visit with [facility] and was gaining fluid again.  His loop diuretic was temporarily increased and he again improved.  He is now again experiencing exertional dyspnea.  He can walk for about 100 feet before asked to stop to catch his breath.  He gets some  chest discomfort as well, but the dyspnea always comes first.  He has dyspnea when bending over to tie his shoelaces.  He does not have orthopnea, but habitually sleeps with the head of the bed elevated at 20 degrees.  I do not think he has had any paroxysmal nocturnal dyspnea.  He denies any lower extremity edema.  He has been taking naproxen for musculoskeletal pain.  Blood sugar control remains very poor.  He has not had symptoms of dizziness or syncope and denies palpitations.  He denies focal neurological events.  His echo in August showed preserved left ventricular systolic function.  The patient does not have symptoms concerning for COVID-19 infection (fever, chills, cough, or new shortness of breath).    Past Medical History:  Diagnosis Date  . Coronary artery disease   . Diabetes mellitus without complication (HCC)   . Fatty liver   . Hyperlipidemia   . Stroke [facility])    Past Surgical History:  Procedure Laterality Date  . CHOLECYSTECTOMY    . CORONARY ARTERY BYPASS GRAFT N/A 11/10/2018   Procedure: CORONARY ARTERY BYPASS GRAFTING (CABG) x Two , using left internal mammary artery and right leg greater saphenous vein harvested endoscopically - SVG to Ramus, LIMA to LAD;  Surgeon: Delight Ovens, MD;  Location: [facility] OR;  Service: Open Heart Surgery;  Laterality: N/A;  . LEFT HEART CATH AND CORONARY ANGIOGRAPHY N/A 11/09/2018   Procedure: LEFT HEART CATH AND CORONARY ANGIOGRAPHY;  Surgeon: Lennette Bihari, MD;  Location: [facility] INVASIVE CV LAB;  Service: Cardiovascular;  Laterality: N/A;  . SHOULDER  SURGERY Left   . TEE WITHOUT CARDIOVERSION N/A 11/10/2018   Procedure: TRANSESOPHAGEAL ECHOCARDIOGRAM (TEE);  Surgeon: Grace Isaac, MD;  Location: [place];  Service: Open Heart Surgery;  Laterality: N/A;     Current Meds  Medication Sig  . ACCU-CHEK AVIVA PLUS test strip   . allopurinol (ZYLOPRIM) 300 MG tablet Take 300 mg by mouth daily.  Marland Kitchen aspirin EC 81 MG tablet Take 81 mg by mouth  daily.  Marland Kitchen atorvastatin (LIPITOR) 80 MG tablet Take 1 tablet (80 mg total) by mouth daily at 6 PM.  . diphenhydrAMINE (BENADRYL) 25 MG tablet Take 25 mg by mouth daily as needed for itching.   . furosemide (LASIX) 40 MG tablet Take 40 mg by mouth daily.  Marland Kitchen gabapentin (NEURONTIN) 400 MG capsule Take 800 mg by mouth 3 (three) times daily.  Marland Kitchen glipiZIDE (GLUCOTROL) 5 MG tablet Take 5 mg by mouth daily.  Marland Kitchen guaiFENesin (MUCINEX) 600 MG 12 hr tablet Take 1 tablet (600 mg total) by mouth 2 (two) times daily as needed for cough.  . insulin lispro protamine-lispro (HUMALOG 75/25 MIX) (75-25) 100 UNIT/ML SUSP injection Inject 20-30 Units into the skin See admin instructions. Inject 40 units in am and 20 units bedtime.  Elmore Guise Devices (ADJUSTABLE LANCING DEVICE) MISC   . metFORMIN (GLUCOPHAGE) 1000 MG tablet Take 1,000 mg by mouth 2 (two) times daily with a meal.   . metoprolol tartrate (LOPRESSOR) 25 MG tablet Take 0.5 tablets (12.5 mg total) by mouth 2 (two) times daily.  . Multiple Vitamins-Minerals (MULTIVITAMIN WITH MINERALS) tablet Take 1 tablet by mouth 2 (two) times a week.   Marland Kitchen omeprazole (PRILOSEC) 20 MG capsule Take 40 mg by mouth daily.   Marland Kitchen oxyCODONE (OXY IR/ROXICODONE) 5 MG immediate release tablet Take 1 tablet (5 mg total) by mouth every 6 (six) hours as needed for severe pain.  Marland Kitchen spironolactone (ALDACTONE) 25 MG tablet TAKE 1 TABLET BY MOUTH ONCE DAILY FOR 30 DAYS     Allergies:   Bee venom and Lisinopril   Social History   Tobacco Use  . Smoking status: Former Smoker    Packs/day: 1.00    Years: 12.00    Pack years: 12.00    Types: Cigarettes    Start date: 1968    Quit date: 1981    Years since quitting: 39.9  . Smokeless tobacco: Current User    Types: Chew  Substance Use Topics  . Alcohol use: Yes    Comment: rare social couple of times a year  . Drug use: Never     Family Hx: The patient's family history includes Heart attack in his father; Heart failure in his father;  Hyperlipidemia in his mother; Hypothyroidism in his mother.  ROS:   Please see the history of present illness.    All other systems reviewed and are negative.   Prior CV studies:   The following studies were reviewed today: Lexiscan Myoview 04/13/2019 1.) LIMITATIONS: None. 2.) MYOCARDIAL PERFUSION: No evidence of fixed defect or reversible transmural ischemia. 3.) LEFT VENTRICULAR EJECTION FRACTION: Normal. 4.) REGIONAL WALL MOTION: Normal.   Echocardiogram August 2020  SUMMARY  The left ventricular size is normal.  Mild left ventricular hypertrophy    Left ventricular systolic function is normal.  LV ejection fraction = 55-60%.    Left ventricular filling pattern is prolonged relaxation.  The right ventricle is normal in size and function.  There is aortic valve sclerosis.  There is no significant valvular stenosis  or regurgitation.  The IVC is normal in size with an inspiratory collapse of greater then 50%,   suggesting normal right atrial pressure.  There is no pericardial effusion.  Probably no significant change in comparison with the prior study noted   MV E max vel: 72.0 cm/sec  MV A max vel: 83.1 cm/sec  MV E/A: 0.87  Med Peak E' Vel: 6.9 cm/sec  Lat Peak E' Vel: 14.7 cm/sec  E/Lat E`: 4.9  E/Med E`: 10.5  MV dec time: 0.25 sec  Labs/Other Tests and Data Reviewed:    EKG:  An ECG dated 05/04/2019 was personally reviewed today and demonstrated:  Sinus rhythm with mild first-degree AV block  Recent Labs: 11/09/2018: ALT 44 11/11/2018: Magnesium 2.2 11/16/2018: BUN 18; Creatinine, Ser 1.23; Hemoglobin 11.3; Platelets 191; Potassium 3.8; Sodium 134   Recent Lipid Panel Lab Results  Component Value Date/Time   CHOL 228 (H) 11/05/2018 04:07 PM   TRIG 587 (HH) 11/05/2018 04:07 PM   HDL 33 (L) 11/05/2018 04:07 PM   CHOLHDL 6.9 (H) 11/05/2018 04:07 PM   LDLCALC Comment 11/05/2018 04:07 PM    Wt Readings from Last 3 Encounters:   07/27/19 206 lb (93.4 kg)  05/27/19 205 lb (93 kg)  05/04/19 210 lb 6 oz (95.4 kg)     Objective:    Vital Signs:  BP 115/82   Pulse 76   Ht  (1.676 m)   Wt 206 lb (93.4 kg)   BMI 33.25 kg/m    VITAL SIGNS:  reviewed GEN:  no acute distress EYES:  sclerae anicteric, EOMI - Extraocular Movements Intact RESPIRATORY:  normal respiratory effort, symmetric expansion CARDIOVASCULAR:  no peripheral edema SKIN:  no rash, lesions or ulcers. MUSCULOSKELETAL:  no obvious deformities. NEURO:  alert and oriented x 3, no obvious focal deficit PSYCH:  normal affect  ASSESSMENT & PLAN:    1. CHF: I think feel he is describing symptoms of congestive heart failure again.  I asked him to stop taking NSAIDs, specifically naproxen.  He will increase his furosemide to 80 mg daily and his spironolactone to 50 mg daily.  We will check labs in a couple of weeks.  He is seeing his primary care provider on December 5 and that would be a good opportunity to draw his labs. 2. CAD: He is having some exertional chest discomfort, but I suspect this is due to heart failure exacerbation rather than new coronary stenoses. 3. HLP: He has severe hypertriglyceridemia, low HDL and a very adverse lipid profile, despite high-dose statin.  To a large degree this is related to poor glycemic control, but also with his sedentary lifestyle and lack of adherence to a truly healthy diet. 4. DM: I think he would greatly benefit from an SGLT2 inhibitor such as Jardiance or [place], which hopefully could replace his insulin or help reduce the dose of insulin he is on.  These agents would definitely help with treatment of his heart failure and statistically would improve his overall cardiovascular long-term outlook. 5. OSA: I recommended 100% compliance with CPAP. 6. HTN: Well-controlled.  Watch for decrease in blood pressure with the increased doses of diuretics.  COVID-19 Education: The signs and symptoms of COVID-19 were  discussed with the patient and how to seek care for testing (follow up with PCP or arrange E-visit).  The importance of social distancing was discussed today.  Time:   Today, I have spent 22 minutes with the patient with telehealth technology discussing the above  problems.     Medication Adjustments/Labs and Tests Ordered: Current medicines are reviewed at length with the patient today.  Concerns regarding medicines are outlined above.   Tests Ordered: No orders of the defined types were placed in this encounter.   Medication Changes: No orders of the defined types were placed in this encounter.   Follow Up:  In Person 2 weeks  Patient Instructions  Medication Instructions:  INCREASE the Furosemide to 80 mg (2 of the 40 mg tablets) once daily INCREASE the Spironolactone to 50 mg (2 of the 25 mg tablets) once daily STOP the Naproxen  *If you need a refill on your cardiac medications before your next appointment, please call your pharmacy*  Lab Work: Your provider would like for you to have the following labs at your PCP visit: Lipid, A1C and CMET  If you have labs (blood work) drawn today and your tests are completely normal, you will receive your results only by: Marland Kitchen. MyChart Message (if you have MyChart) OR . A paper copy in the mail If you have any lab test that is abnormal or we need to change your treatment, we will call you to review the results.  Testing/Procedures: None ordered  Follow-Up: At [facility], you and your health needs are our priority.  As part of our continuing mission to provide you with exceptional heart care, we have created designated Provider Care Teams.  These Care Teams include your primary Cardiologist (physician) and Advanced Practice Providers (APPs -  Physician Assistants and Nurse Practitioners) who all work together to provide you with the care you need, when you need it.  Your next appointment:   Follow up in 2 weeks with Dr. Royann Shiversroitoru or an  APP    Signed, Thurmon FairMihai Ahan Eisenberger, MD  07/27/2019 8:47 AM    [facility]

## 2019-07-27 NOTE — Patient Instructions (Addendum)
Medication Instructions:  INCREASE the Furosemide to 80 mg (2 of the 40 mg tablets) once daily INCREASE the Spironolactone to 50 mg (2 of the 25 mg tablets) once daily STOP the Naproxen  *If you need a refill on your cardiac medications before your next appointment, please call your pharmacy*  Lab Work: Your provider would like for you to have the following labs at your PCP visit: Lipid, A1C and CMET  If you have labs (blood work) drawn today and your tests are completely normal, you will receive your results only by: Marland Kitchen MyChart Message (if you have MyChart) OR . A paper copy in the mail If you have any lab test that is abnormal or we need to change your treatment, we will call you to review the results.  Testing/Procedures: None ordered  Follow-Up: At [facility], you and your health needs are our priority.  As part of our continuing mission to provide you with exceptional heart care, we have created designated Provider Care Teams.  These Care Teams include your primary Cardiologist (physician) and Advanced Practice Providers (APPs -  Physician Assistants and Nurse Practitioners) who all work together to provide you with the care you need, when you need it.  Your next appointment:   Follow up in 2 weeks with Dr. Sallyanne Kuster or an APP

## 2019-08-01 ENCOUNTER — Telehealth: Payer: Self-pay | Admitting: *Deleted

## 2019-08-01 NOTE — Telephone Encounter (Signed)
Pt c/o increased SOB with chest pain/nausea  for the last 2 days - says he used to walk 1.5 miles a day now can only walk about 50 feet without having symptoms - pt sounds SOB over the phone and rate chest pain 8/10 - denies dizziness/swelling - pt will have wife take him to ED for evaluation

## 2019-08-02 ENCOUNTER — Telehealth: Payer: Self-pay | Admitting: Cardiovascular Disease

## 2019-08-02 NOTE — Telephone Encounter (Signed)
Pt state he had lab work done yesterday at [facility] and inquiring if MD has received results.    Will route to Nurse.

## 2019-08-02 NOTE — Telephone Encounter (Signed)
Left message to call back  

## 2019-08-02 NOTE — Telephone Encounter (Signed)
Patient's wife called stating that she would like for the nurse to call her husband as he having some symptoms.  She said for her it sounds like heartburn, but she did not go into any further details.

## 2019-08-02 NOTE — Telephone Encounter (Signed)
Called patient back- he states that the past two days he has had chest pains, he states it feels like someone is squeezing on his chest every time he stands up. He states that he is SOB and can hardly walk around his home- he denies swelling in extremities, but does mention having severe heartburn yesterday- even after not eating all day he continued to have it. He does mention some arm pain at times, but none at this moment. He rated his chest pain on a 8/10- I advised with patient to call 911 and go to ED as it was said for him to go yesterday, but he did not have anyone to take him. I advised to call 911 and have them come out and evaluate him, do an EKG- and take him as his symptoms are not improving and worsening according to patient.  BP has been good per patient, and he has had no changes in medication- patient verbalized understanding and plan to go to ED as instructed, if wife unable to get him- he will call 911.  Advised I would route message to make MD aware.

## 2019-08-02 NOTE — Telephone Encounter (Signed)
Follow Up:     Pt would like his lab results from yesterday please. :

## 2019-08-05 NOTE — Telephone Encounter (Signed)
MyChart message sent regarding this message

## 2019-08-11 ENCOUNTER — Other Ambulatory Visit: Payer: Self-pay

## 2019-08-11 ENCOUNTER — Ambulatory Visit: Payer: Medicare Other | Admitting: Cardiovascular Disease

## 2019-08-11 ENCOUNTER — Encounter: Payer: Self-pay | Admitting: Cardiovascular Disease

## 2019-08-11 VITALS — BP 122/79 | HR 77 | Temp 97.0°F | Ht 66.5 in | Wt 202.0 lb

## 2019-08-11 DIAGNOSIS — I10 Essential (primary) hypertension: Secondary | ICD-10-CM | POA: Diagnosis not present

## 2019-08-11 DIAGNOSIS — E782 Mixed hyperlipidemia: Secondary | ICD-10-CM

## 2019-08-11 DIAGNOSIS — G4733 Obstructive sleep apnea (adult) (pediatric): Secondary | ICD-10-CM

## 2019-08-11 DIAGNOSIS — E1140 Type 2 diabetes mellitus with diabetic neuropathy, unspecified: Secondary | ICD-10-CM

## 2019-08-11 DIAGNOSIS — I5032 Chronic diastolic (congestive) heart failure: Secondary | ICD-10-CM

## 2019-08-11 DIAGNOSIS — I2510 Atherosclerotic heart disease of native coronary artery without angina pectoris: Secondary | ICD-10-CM | POA: Diagnosis not present

## 2019-08-11 DIAGNOSIS — E1165 Type 2 diabetes mellitus with hyperglycemia: Secondary | ICD-10-CM

## 2019-08-11 DIAGNOSIS — Z8673 Personal history of transient ischemic attack (TIA), and cerebral infarction without residual deficits: Secondary | ICD-10-CM

## 2019-08-11 DIAGNOSIS — IMO0002 Reserved for concepts with insufficient information to code with codable children: Secondary | ICD-10-CM

## 2019-08-11 MED ORDER — INVOKAMET 150-1000 MG PO TABS: 1.0000 | ORAL_TABLET | Freq: Two times a day (BID) | ORAL | 3 refills | Status: DC

## 2019-08-11 NOTE — Progress Notes (Signed)
Cardiology Office Note:    Date:  08/13/2019   ID:  Eric [name], DOB [birth year], MRN [id number]  PCP:  Rudi Coco, MD  Cardiologist:  Thurmon Fair, MD  Electrophysiologist:  None   Referring MD: Rudi Coco, MD   Chief Complaint  Patient presents with  . Shortness of Breath    History of Present Illness:    Eric [name] is a 58 y.o. male with a hx of CAD s/p CABG March 2020 (complicated by brief postoperative atrial fibrillation that has not recurred), heart failure type 2 diabetes mellitus (poorly controlled and complicated by polyneuropathy, hyperlipidemia, fatty liver), mixed hyperlipidemia with severe hypertriglyceridemia and low HDL, history of TIA, OSA gastroesophageal reflux disease.  He was hospitalized at [facility] in August for heart failure that improved with diuretic therapy.  He underwent an echocardiogram that again showed normal LVEF and a Lexiscan Myoview that did not show ischemia.  Following discharge she again had fluid gain and after diuretic dose increase he improved (seen in September by Corine Shelter).  He complains of exertional dyspnea just walking 75-100 feet. He has bendopnea.  He is very sedentary.  His weight is actually lower than it was at his telemedicine visit on November 25 and lower than our previous estimation of his dry weight.  He does not have leg edema.  He does not have chest discomfort (this was never a complaint even before his bypass surgery.  He does not have orthopnea or PND or really any complaints at rest.  He denies new focal neurological complaints or claudication.  He is unhappy with his current prescription for Humalog 75/25 and wants to go back on NovoLog 70/30.  He found that more convenient.  Labs performed on November 30 were significant for a glucose of 499 and hemoglobin A1c of 13.6%.  He is drinking fruit juices and eating sugary and starchy foods.  His triglycerides are 977 (total  cholesterol 209, HDL 24).  Also troubling was a creatinine of 2.03, BUN of 46.  His sodium was 131, not as low as expected for his degree of hyperglycemia.  His potassium was normal at 4.8.  Past Medical History:  Diagnosis Date  . Coronary artery disease   . Diabetes mellitus without complication (HCC)   . Fatty liver   . Hyperlipidemia   . Stroke [facility])     Past Surgical History:  Procedure Laterality Date  . CHOLECYSTECTOMY    . CORONARY ARTERY BYPASS GRAFT N/A 11/10/2018   Procedure: CORONARY ARTERY BYPASS GRAFTING (CABG) x Two , using left internal mammary artery and right leg greater saphenous vein harvested endoscopically - SVG to Ramus, LIMA to LAD;  Surgeon: Delight Ovens, MD;  Location: [facility] OR;  Service: Open Heart Surgery;  Laterality: N/A;  . LEFT HEART CATH AND CORONARY ANGIOGRAPHY N/A 11/09/2018   Procedure: LEFT HEART CATH AND CORONARY ANGIOGRAPHY;  Surgeon: Lennette Bihari, MD;  Location: [facility] INVASIVE CV LAB;  Service: Cardiovascular;  Laterality: N/A;  . SHOULDER SURGERY Left   . TEE WITHOUT CARDIOVERSION N/A 11/10/2018   Procedure: TRANSESOPHAGEAL ECHOCARDIOGRAM (TEE);  Surgeon: Delight Ovens, MD;  Location: [facility] OR;  Service: Open Heart Surgery;  Laterality: N/A;    Current Medications: Current Meds  Medication Sig  . ACCU-CHEK AVIVA PLUS test strip   . allopurinol (ZYLOPRIM) 300 MG tablet Take 300 mg by mouth daily.  Marland Kitchen aspirin EC 81 MG tablet Take 81 mg by mouth daily.  Marland Kitchen atorvastatin (LIPITOR) 80  MG tablet Take 1 tablet (80 mg total) by mouth daily at 6 PM.  . diphenhydrAMINE (BENADRYL) 25 MG tablet Take 25 mg by mouth daily as needed for itching.   . furosemide (LASIX) 40 MG tablet Take 2 tablets (80 mg total) by mouth daily.  Marland Kitchen gabapentin (NEURONTIN) 400 MG capsule Take 800 mg by mouth 3 (three) times daily.  Marland Kitchen glipiZIDE (GLUCOTROL) 5 MG tablet Take 5 mg by mouth daily.  Marland Kitchen guaiFENesin (MUCINEX) 600 MG 12 hr tablet Take 1 tablet (600 mg total) by mouth 2  (two) times daily as needed for cough.  . insulin lispro protamine-lispro (HUMALOG 75/25 MIX) (75-25) 100 UNIT/ML SUSP injection Inject 20-30 Units into the skin See admin instructions. Inject 40 units in am and 20 units bedtime.  Elmore Guise Devices (ADJUSTABLE LANCING DEVICE) MISC   . metoprolol tartrate (LOPRESSOR) 25 MG tablet Take 0.5 tablets (12.5 mg total) by mouth 2 (two) times daily.  . Multiple Vitamins-Minerals (MULTIVITAMIN WITH MINERALS) tablet Take 1 tablet by mouth 2 (two) times a week.   Marland Kitchen omeprazole (PRILOSEC) 20 MG capsule Take 40 mg by mouth daily.   Marland Kitchen oxyCODONE (OXY IR/ROXICODONE) 5 MG immediate release tablet Take 1 tablet (5 mg total) by mouth every 6 (six) hours as needed for severe pain.  . saw palmetto 160 MG capsule Take 160 mg by mouth daily.  Marland Kitchen spironolactone (ALDACTONE) 25 MG tablet Take 2 tablets (50 mg total) by mouth daily.  . [DISCONTINUED] metFORMIN (GLUCOPHAGE) 1000 MG tablet Take 1,000 mg by mouth 2 (two) times daily with a meal.      Allergies:   Bee venom and Lisinopril   Social History   Socioeconomic History  . Marital status: Married    Spouse name: Rudene Christians   . Number of children: 3  . Years of education: Not on file  . Highest education level: Not on file  Occupational History  . Occupation: disability   Tobacco Use  . Smoking status: Former Smoker    Packs/day: 1.00    Years: 12.00    Pack years: 12.00    Types: Cigarettes    Start date: 1968    Quit date: 1981    Years since quitting: 39.9  . Smokeless tobacco: Current User    Types: Chew  Substance and Sexual Activity  . Alcohol use: Yes    Comment: rare social couple of times a year  . Drug use: Never  . Sexual activity: Not on file  Other Topics Concern  . Not on file  Social History Narrative   Smoked from 1st grade to 12th grade then quit   Social Determinants of Health   Financial Resource Strain: Low Risk   . Difficulty of Paying Living Expenses: Not very hard  Food  Insecurity: No Food Insecurity  . Worried About Charity fundraiser in the Last Year: Never true  . Ran Out of Food in the Last Year: Never true  Transportation Needs: No Transportation Needs  . Lack of Transportation (Medical): No  . Lack of Transportation (Non-Medical): No  Physical Activity: Inactive  . Days of Exercise per Week: 0 days  . Minutes of Exercise per Session: 0 min  Stress: No Stress Concern Present  . Feeling of Stress : Not at all  Social Connections: Slightly Isolated  . Frequency of Communication with Friends and Family: More than three times a week  . Frequency of Social Gatherings with Friends and Family: More than three times a  week  . Attends Religious Services: More than 4 times per year  . Active Member of Clubs or Organizations: No  . Attends Banker Meetings: Never  . Marital Status: Married  Does use snuff  Family History: The patient's family history includes Heart attack in his father; Heart failure in his father; Hyperlipidemia in his mother; Hypothyroidism in his mother. His father passed away at age 53 from congestive heart failure.  ROS:   Please see the history of present illness.    All other systems are reviewed and are negative.   EKGs/Labs/Other Studies Reviewed:    The following studies were reviewed today: Echo and nuclear stress test from August at [facility]  EKG:  EKG is not ordered today.  It shows normal sinus rhythm, normal tracing, borderline QTC 459 ms.  Recent Labs: 11/09/2018: ALT 44 11/11/2018: Magnesium 2.2 11/16/2018: BUN 18; Creatinine, Ser 1.23; Hemoglobin 11.3; Platelets 191; Potassium 3.8; Sodium 134   08/01/2019 Glucose 499, hemoglobin A1c 13.6 Creatinine 2.03, BUN 46, sodium 131, potassium 4.8 Total cholesterol 209, triglycerides 9077, HDL 24  Recent Lipid Panel August 18 2018 Total cholesterol 203, triglycerides 569, HDL 26 08/01/2019 Total cholesterol 209, triglycerides 9077, HDL 24   Lipid Panel     Component Value Date/Time   CHOL 228 (H) 11/05/2018 1607   TRIG 587 (HH) 11/05/2018 1607   HDL 33 (L) 11/05/2018 1607   CHOLHDL 6.9 (H) 11/05/2018 1607   LDLCALC Comment 11/05/2018 1607     Physical Exam:    VS:  BP 122/79   Pulse 77   Temp (!) 97 F (36.1 C)   Ht 5' 6.5" (1.689 m)   Wt 202 lb (91.6 kg)   SpO2 98%   BMI 32.12 kg/m     Wt Readings from Last 3 Encounters:  08/11/19 202 lb (91.6 kg)  07/27/19 206 lb (93.4 kg)  05/27/19 205 lb (93 kg)     General: Alert, oriented x3, no distress, mildly obese Head: no evidence of trauma, PERRL, EOMI, no exophtalmos or lid lag, no myxedema, no xanthelasma; normal ears, nose and oropharynx Neck: normal jugular venous pulsations and no hepatojugular reflux; brisk carotid pulses without delay and no carotid bruits Chest: clear to auscultation, no signs of consolidation by percussion or palpation, normal fremitus, symmetrical and full respiratory excursions Cardiovascular: normal position and quality of the apical impulse, regular rhythm, normal first and second heart sounds, no murmurs, rubs or gallops Abdomen: no tenderness , prominent adiposity , no masses by palpation, no abnormal pulsatility or arterial bruits, normal bowel sounds, no hepatosplenomegaly Extremities: no clubbing, cyanosis or edema; 2+ radial, ulnar and brachial pulses bilaterally; 2+ right femoral, posterior tibial and dorsalis pedis pulses; 2+ left femoral, posterior tibial and dorsalis pedis pulses; no subclavian or femoral bruits Neurological: grossly nonfocal Psych: Normal mood and affect   ASSESSMENT:    1. Coronary artery disease involving native coronary artery of native heart without angina pectoris   2. Chronic diastolic heart failure (HCC)   3. Type 2 diabetes mellitus, uncontrolled, with neuropathy (HCC)   4. Mixed hyperlipidemia   5. Essential hypertension   6. OSA (obstructive sleep apnea)   7. History of TIA (transient  ischemic attack)    PLAN:    In order of problems listed above:  1. CAD: s/p CABG March 2020 (LIMA to LAD and SVG to ramus intermedius).  He does not have angina pectoris and had a low risk Myoview just a few months  ago. 2. CHF: Symptoms are concerning for heart failure exacerbation (diastolic), but his weight is lower than in the past and his most recent renal parameters were worrisome.  Obesity limits his physical exam but there is no overt evidence of hypervolemia. 3. DM: Very poor control.  Had a lengthy discussion with him about improving his diet, especially avoiding fruit juices, sweets and bread.  He was unable to afford Jardiance which reportedly would have cost $900.  Invokana is covered by his plan.  Marcelline DeistFarxiga is not.  We will stop his Metformin and give him a prescription for Invokamet 1 50-1000 twice daily.  We will have to recheck his lab tests promptly next week.  I did not change his insulin prescription and asked him to check with his diabetes specialist about this. 4. HLP: His hypertriglyceridemia is severe, puts him at risk for acute pancreatitis.  The priority here however is glucose control.  Continue statin. 5. HTN: Normal blood pressure today.  He is on spironolactone, very low-dose metoprolol.  He was intolerant of ACE inhibitors due to cough. 6. OSA: recommend 100% compliance with CPAP, he is only really achieving about 50%..  He does have residual hypersomnolence.   7. History of TIA: On aspirin.  No recent neurological complaints.   Medication Adjustments/Labs and Tests Ordered: Current medicines are reviewed at length with the patient today.  Concerns regarding medicines are outlined above.  Orders Placed This Encounter  Procedures  . EKG 12-Lead   Meds ordered this encounter  Medications  . Canagliflozin-metFORMIN HCl (INVOKAMET) [phone] MG TABS    Sig: Take 1 tablet by mouth 2 (two) times daily.    Dispense:  60 tablet    Refill:  3    No more refills-needs to  follow up with endocrinologist    Patient Instructions  Medication Instructions:  STOP the Metformin START the Invokamet (this replaces the Metformin) [phone] once tablet twice daily  *If you need a refill on your cardiac medications before your next appointment, please call your pharmacy*  Lab Work: None ordered If you have labs (blood work) drawn today and your tests are completely normal, you will receive your results only by: Marland Kitchen. MyChart Message (if you have MyChart) OR . A paper copy in the mail If you have any lab test that is abnormal or we need to change your treatment, we will call you to review the results.  Testing/Procedures: None ordered  Follow-Up: At [facility], you and your health needs are our priority.  As part of our continuing mission to provide you with exceptional heart care, we have created designated Provider Care Teams.  These Care Teams include your primary Cardiologist (physician) and Advanced Practice Providers (APPs -  Physician Assistants and Nurse Practitioners) who all work together to provide you with the care you need, when you need it.  Your next appointment:   3 month(s)  The format for your next appointment:   In Person  Provider:   Thurmon FairMihai Chloe Miyoshi, MD     Signed, Thurmon FairMihai Alie Hardgrove, MD  08/13/2019 8:05 PM    [facility]

## 2019-08-11 NOTE — Patient Instructions (Signed)
Medication Instructions:  STOP the Metformin START the Invokamet (this replaces the Metformin) [phone] once tablet twice daily  *If you need a refill on your cardiac medications before your next appointment, please call your pharmacy*  Lab Work: None ordered If you have labs (blood work) drawn today and your tests are completely normal, you will receive your results only by: Marland Kitchen MyChart Message (if you have MyChart) OR . A paper copy in the mail If you have any lab test that is abnormal or we need to change your treatment, we will call you to review the results.  Testing/Procedures: None ordered  Follow-Up: At [facility], you and your health needs are our priority.  As part of our continuing mission to provide you with exceptional heart care, we have created designated Provider Care Teams.  These Care Teams include your primary Cardiologist (physician) and Advanced Practice Providers (APPs -  Physician Assistants and Nurse Practitioners) who all work together to provide you with the care you need, when you need it.  Your next appointment:   3 month(s)  The format for your next appointment:   In Person  Provider:   Sanda Klein, MD

## 2019-08-13 ENCOUNTER — Encounter: Payer: Self-pay | Admitting: Cardiovascular Disease

## 2019-08-18 ENCOUNTER — Encounter: Payer: Self-pay | Admitting: *Deleted

## 2019-08-18 ENCOUNTER — Other Ambulatory Visit: Payer: Self-pay | Admitting: *Deleted

## 2019-08-18 DIAGNOSIS — I5032 Chronic diastolic (congestive) heart failure: Secondary | ICD-10-CM

## 2019-08-18 DIAGNOSIS — I10 Essential (primary) hypertension: Secondary | ICD-10-CM

## 2019-08-18 NOTE — Addendum Note (Signed)
Addended by: Ricci Barker on: 08/18/2019 09:56 AM   Modules accepted: Orders

## 2019-08-23 ENCOUNTER — Telehealth: Payer: Self-pay | Admitting: *Deleted

## 2019-08-23 DIAGNOSIS — I10 Essential (primary) hypertension: Secondary | ICD-10-CM

## 2019-08-23 DIAGNOSIS — I5032 Chronic diastolic (congestive) heart failure: Secondary | ICD-10-CM

## 2019-08-23 NOTE — Telephone Encounter (Signed)
The patient has been called and advised to come in for lab work Artist). He stated that it is easier to go to his PCP. PCP has been called to see if he can go have his lab work completed there.   The PCP office stated that he would be not be able to have the labs drawn there due to the office not being a part of [facility].  The patient stated that he will go to a local [facility].

## 2019-09-20 ENCOUNTER — Encounter: Payer: Self-pay | Admitting: *Deleted

## 2019-09-28 LAB — BASIC METABOLIC PANEL
BUN/Creatinine Ratio: 21 — ABNORMAL HIGH (ref 9–20)
BUN: 34 mg/dL — ABNORMAL HIGH (ref 6–24)
CO2: 21 mmol/L (ref 20–29)
Calcium: 9.8 mg/dL (ref 8.7–10.2)
Chloride: 90 mmol/L — ABNORMAL LOW (ref 96–106)
Creatinine, Ser: 1.59 mg/dL — ABNORMAL HIGH (ref 0.76–1.27)
GFR calc Af Amer: 55 mL/min/{1.73_m2} — ABNORMAL LOW (ref >59)
GFR calc non Af Amer: 47 mL/min/{1.73_m2} — ABNORMAL LOW (ref >59)
Glucose: 584 mg/dL (ref 65–99)
Potassium: 5.1 mmol/L (ref 3.5–5.2)
Sodium: 128 mmol/L — ABNORMAL LOW (ref 134–144)

## 2019-09-30 IMAGING — DX DG CHEST 1V PORT
1 series · 1 of 1 positions shown · non-contrast
Comparison: None.

CLINICAL DATA: Chest pain, shortness of breath and dizziness onset
today. History of diabetes.

EXAM:
PORTABLE CHEST 1 VIEW

[chest]
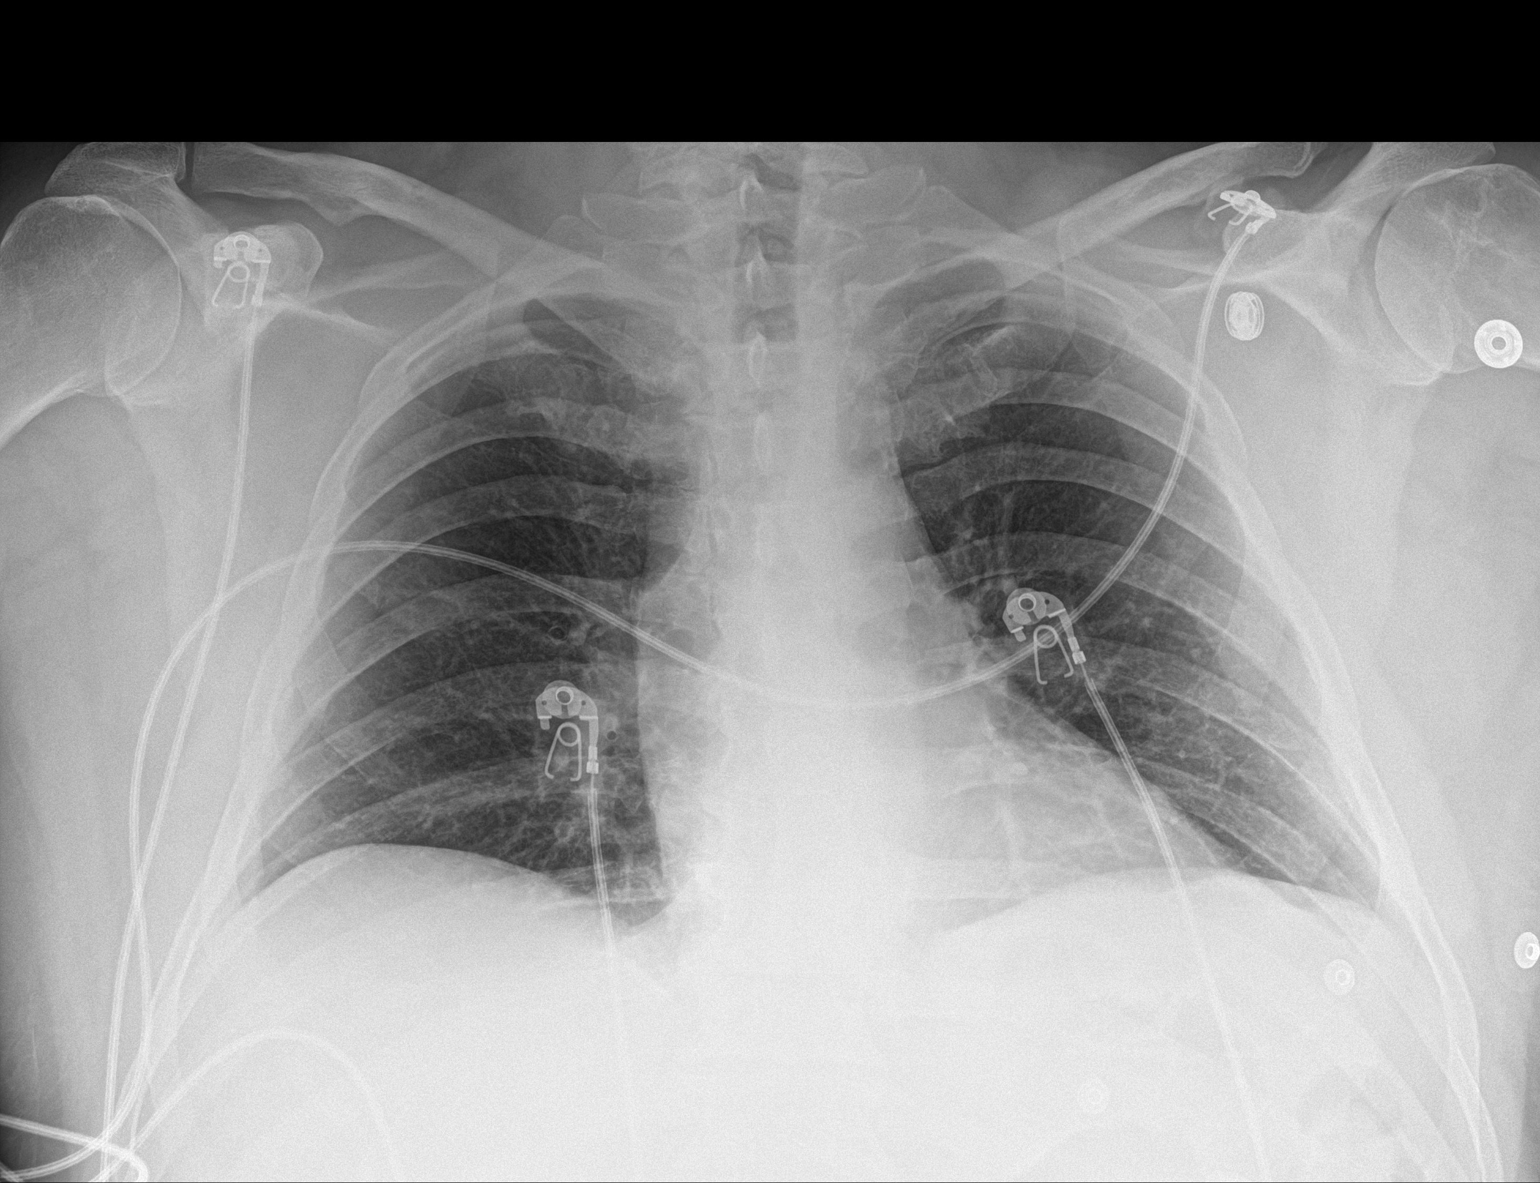

[1 of 1 positions shown; findings below may reference images not displayed]

FINDINGS: The heart size and mediastinal contours are within normal limits.
Both lungs are clear. The visualized skeletal structures are
unremarkable.
IMPRESSION: No active disease.  No evidence of pneumonia or pulmonary edema.

## 2019-10-10 ENCOUNTER — Other Ambulatory Visit: Payer: Self-pay

## 2019-10-11 MED ORDER — SPIRONOLACTONE 25 MG PO TABS: 50.0000 mg | ORAL_TABLET | Freq: Every day | ORAL | 2 refills | Status: DC

## 2019-10-12 ENCOUNTER — Other Ambulatory Visit: Payer: Self-pay | Admitting: Cardiovascular Disease

## 2019-10-12 MED ORDER — SPIRONOLACTONE 25 MG PO TABS: 50.0000 mg | ORAL_TABLET | Freq: Every day | ORAL | 2 refills | Status: DC

## 2019-10-12 MED ORDER — FUROSEMIDE 40 MG PO TABS: 80.0000 mg | ORAL_TABLET | Freq: Every day | ORAL | 2 refills | Status: DC

## 2019-10-12 NOTE — Telephone Encounter (Signed)
Pt needs medications resent to [facility] mail order pharmacy. Please address

## 2019-11-23 ENCOUNTER — Ambulatory Visit: Payer: Medicare Other | Admitting: Cardiovascular Disease

## 2019-12-08 ENCOUNTER — Telehealth: Payer: Self-pay | Admitting: Cardiovascular Disease

## 2019-12-08 NOTE — Progress Notes (Signed)
°Cardiology Office Note:   ° °Date:  12/09/2019  ° °ID:  Eric [name], DOB 08/12/1961, MRN [id number] ° °PCP:  Marlowe-Rogers, Hidi, MD  °Cardiologist:  Mihai Croitoru, MD  °Electrophysiologist:  None  ° °Referring MD: Marlowe-Rogers, Hidi, MD  ° °Chief Complaint: weakness and shortness of breath ° °History of Present Illness:   ° °Eric [name] is a 58 y.o. 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 Croitoru and presents today for evaluation of shortness of breath and weakness. ° °Patient underwent left heart catheterization in 10/2018 which showed significant ostial LAD stenosis at a sharp angle arising form the left main with 70% proximal optional diagonal stenosis. CT surgery was consulted and patient underwent CABG x2 with LIMA to LAD and SVG to RI on 11/10/2018. Patient then hospitalized at [facility] in 04/2019 with diastolic CHF that improved with diuresis. Echo at that time showed normal LVEF and Myoview did not show ischemia. Diabetes has been poorly controlled lately Glucose in the 400 to 600 range on BMET in Care Everywhere and Hemoglobin A1c of 13.6 in 07/2019. Patient was last seen by Dr. Croitoru on 08/11/2019 at which time he reported exertional dyspnea with minimal activity such as walking 75 to 100 feet as well as bendopnea. However, not chest pain or other signs of CHF. Weight was actually lower than previous visit; therefore. Dr. Croitoru stopped his Metformin and started Invokamet at that time.  ° °Patient was seen by his PCP on 12/01/2019 with urinary symptoms and diagnosed with UT. He was started on a 10 day course of Cipro as well as Flomax.  ° °Patient's wife called our office yesterday with concerned for weakness/fatigue and shortness of breath with exertion;  therefore, today's visit was scheduled.  ° °Here today with wife. Patient notes progressive weakness and shortness of breath with exertion over the last 6 months. He states he used to be able to walk a mile and a half but now he can barely walk 50 feet or walk up short flight of steps without getting very short of breath and having to stop.  He also notes associated chest discomfort with this that at times is radiated down his left arm.  No chest pain at rest but he does have some chest wall tenderness.  He denies any orthopnea and is not sure if he has had any PND.  No lower extremity edema.  He states he has actually been losing weight and states he is gone from 210 lbs to 180 lbs in about a month.  However, his wife is not sure if this is accurate.  He is down about 5 lbs since his last office visit in 08/2019.  He also reports a significant dry productive cough that he has had for several months but denies any fevers or aches.  No nasal congestion. He notes shortness of breath, cough, and chest pain are all similar to symptoms he had prior to his CABG. His wife is also concerned that he has been more unsteady on his feet.  He has frequent episodes of dizziness where sometimes he ends up falling down.  He does note some palpitations with these events but has difficult elaborating on these episodes. No syncope though.  ° °We had a long and honest discussion about patient's poorly   controlled diabetes.  He seems to have very little insight into just how severe his diabetes it is and the impact that that plays on the rest of his health.  He continues to eat foods that are high in starches, carbs, sugars.  He also admitted to me that he is not always compliant with his insulin.  His hemoglobin A1c in 09/2019 was 14.8.  He recently saw his Endocrinologist at [facility] last month who suspected that patient was not compliant with his insulin given how high his blood sugars have been.  Patient states that his blood  sugars are often in the 600s and 700s but states his meter just "reads high."  Patient does describe symptoms of polyuria and polydipsia. ° °Past Medical History:  °Diagnosis Date  °• Coronary artery disease   °• Diabetes mellitus without complication (HCC)   °• Fatty liver   °• Hyperlipidemia   °• Stroke (HCC)   ° ° °Past Surgical History:  °Procedure Laterality Date  °• CHOLECYSTECTOMY    °• CORONARY ARTERY BYPASS GRAFT N/A 11/10/2018  ° Procedure: CORONARY ARTERY BYPASS GRAFTING (CABG) x Two , using left internal mammary artery and right leg greater saphenous vein harvested endoscopically - SVG to Ramus, LIMA to LAD;  Surgeon: Gerhardt, Edward B, MD;  Location: [facility] OR;  Service: Open Heart Surgery;  Laterality: N/A;  °• LEFT HEART CATH AND CORONARY ANGIOGRAPHY N/A 11/09/2018  ° Procedure: LEFT HEART CATH AND CORONARY ANGIOGRAPHY;  Surgeon: Lark, Thomas A, MD;  Location: [facility] INVASIVE [facility] LAB;  Service: Cardiovascular;  Laterality: N/A;  °• SHOULDER SURGERY Left   °• TEE WITHOUT CARDIOVERSION N/A 11/10/2018  ° Procedure: TRANSESOPHAGEAL ECHOCARDIOGRAM (TEE);  Surgeon: Gerhardt, Edward B, MD;  Location: [facility] OR;  Service: Open Heart Surgery;  Laterality: N/A;  ° ° °Current Medications: °Current Meds  °Medication Sig  °• ACCU-CHEK AVIVA PLUS test strip   °• allopurinol (ZYLOPRIM) 300 MG tablet TAKE 1 TABLET BY MOUTH  DAILY  °• atorvastatin (LIPITOR) 80 MG tablet Take by mouth.  °• ciprofloxacin (CIPRO) 500 MG tablet Take by mouth.  °• diphenhydrAMINE (SOMINEX) 25 MG tablet Take by mouth.  °• Easy Comfort Lancets MISC   °• furosemide (LASIX) 40 MG tablet Take by mouth.  °• gabapentin (NEURONTIN) 400 MG capsule TAKE 2 CAPSULES BY MOUTH 3  TIMES DAILY  °• glipiZIDE (GLUCOTROL) 5 MG tablet Take 5 mg by mouth daily.  °• HYDROcodone-acetaminophen (NORCO/VICODIN) 5-325 MG tablet Take by mouth.  °• insulin aspart (FIASP FLEXTOUCH) 100 UNIT/ML FlexTouch Pen Take as directed  °• insulin glargine, 2 Unit Dial, (TOUJEO MAX) 300 UNIT/ML  Solostar Pen Take as directed. Up to 120 units daily.  °• Insulin Pen Needle (B-D ULTRAFINE III SHORT PEN) 31G X 8 MM MISC Use to inject insulin 1 time daily  °• insulin regular (NOVOLIN R) 100 units/mL injection Inject 35 units TID before meals, per Endocrinology  °• JARDIANCE 10 MG TABS tablet Take 10 mg by mouth daily.  °• naproxen (NAPROSYN) 500 MG tablet Take 500 mg by mouth 2 (two) times daily as needed.  °• omeprazole (PRILOSEC) 20 MG capsule TAKE 2 CAPSULES BY MOUTH ONCE DAILY FOR  ACID  REFLUX  °• spironolactone (ALDACTONE) 25 MG tablet Take 2 tablets (50 mg total) by mouth daily.  °• tamsulosin (FLOMAX) 0.4 MG CAPS capsule Take by mouth.  °  ° °Allergies:   Bee venom and Lisinopril  ° °Social History  ° °Socioeconomic History  °• Marital status: Married  °    Spouse name: [name]   °• Number of children: 3  °• Years of education: Not on file  °• Highest education level: Not on file  °Occupational History  °• Occupation: disability   °Tobacco Use  °• Smoking status: Former Smoker  °  Packs/day: 1.00  °  Years: 12.00  °  Pack years: 12.00  °  Types: Cigarettes  °  Start date: 1968  °  Quit date: 1981  °  Years since quitting: 40.2  °• Smokeless tobacco: Current User  °  Types: Chew  °Substance and Sexual Activity  °• Alcohol use: Yes  °  Comment: rare social couple of times a year  °• Drug use: Never  °• Sexual activity: Not on file  °Other Topics Concern  °• Not on file  °Social History Narrative  ° Smoked from 1st grade to 12th grade then quit  ° °Social Determinants of Health  ° °Financial Resource Strain:   °• Difficulty of Paying Living Expenses:   °Food Insecurity:   °• Worried About Running Out of Food in the Last Year:   °• Ran Out of Food in the Last Year:   °Transportation Needs:   °• Lack of Transportation (Medical):   °• Lack of Transportation (Non-Medical):   °Physical Activity:   °• Days of Exercise per Week:   °• Minutes of Exercise per Session:   °Stress:   °• Feeling of Stress :   °Social  Connections:   °• Frequency of Communication with Friends and Family:   °• Frequency of Social Gatherings with Friends and Family:   °• Attends Religious Services:   °• Active Member of Clubs or Organizations:   °• Attends Club or Organization Meetings:   °• Marital Status:   °  ° °Family History: °The patient's family history includes Heart attack in his father; Heart failure in his father; Hyperlipidemia in his mother; Hypothyroidism in his mother. ° °ROS:   °Please see the history of present illness.    ° °EKGs/Labs/Other Studies Reviewed:   ° °The following studies were reviewed today: ° °Echocardiogram 10/14/2018: °Impressions: ° 1. The left ventricle has normal systolic function with an ejection  °fraction of 60-65%. The cavity size was normal. Left ventricular diastolic  °Doppler parameters are consistent with impaired relaxation No evidence of  °left ventricular regional wall  °motion abnormalities.  ° 2. The mitral valve is normal in structure. There is mild calcification.  °No evidence of mitral valve stenosis. No regurgitation.  ° 3. The tricuspid valve is normal in structure.  ° 4. The aortic valve is tricuspid There is mild calcification of the  °aortic valve. No stenosis.  ° 5. The pulmonic valve was normal in structure.  ° 6. The aortic root is normal in size and structure.  ° 7. There is mild dilatation of the ascending aorta measuring 39 mm.  ° 8. No evidence of left ventricular regional wall motion abnormalities.  ° 9. Right atrial pressure is estimated at 3 mmHg.  °10. No complete TR doppler jet so unable to estimate PA systolic pressure. °_______________ ° °Left Heart Catheterization 11/09/2018:  °· Ost Ramus to Ramus lesion is 70% stenosed. °· Dist LM to Ost LAD lesion is 85% stenosed. °  °Significant ostial 85% LAD stenosis at a sharp angle arising from the left main with 70% proximal optional diagonal stenosis; normal left circumflex and normal large dominant RCA. °  °Low normal global LV  function with mild anterolateral hypocontractility with an EF of approximately 50 to 55%.  LVEDP 8 mmHg. °  °  Recommendation: °Surgical consult for CABG revascularization surgery.  Will change lovastatin to atorvastatin 80 mg.  ° °EKG:  EKG ordered today. EKG personally reviewed and demonstrates normal sinus rhythm, rate 88 bpm, with some non-specific T wave flattening but no concerning St/T changes. Borderline right axis deviation. PR and QRS intervals normal. QTc 471 ms.  ° °Recent Labs: °09/27/2019: BUN 34; Creatinine, Ser 1.59; Potassium 5.1; Sodium 128  °Recent Lipid Panel °   °Component Value Date/Time  ° CHOL 228 (H) 11/05/2018 1607  ° TRIG 587 (HH) 11/05/2018 1607  ° HDL 33 (L) 11/05/2018 1607  ° CHOLHDL 6.9 (H) 11/05/2018 1607  ° LDLCALC Comment 11/05/2018 1607  ° ° °Physical Exam:   ° °Vital Signs: BP 130/80    Pulse 88    Temp (!) 97 °F (36.1 °C)    Ht 5' 6.5" (1.689 m)    Wt 197 lb 9.6 oz (89.6 kg)    BMI 31.42 kg/m²    ° °Wt Readings from Last 3 Encounters:  °12/09/19 197 lb 9.6 oz (89.6 kg)  °08/11/19 202 lb (91.6 kg)  °07/27/19 206 lb (93.4 kg)  °  ° °General: 58 y.o. male in no acute distress. °HEENT: Normocephalic and atraumatic. °Neck: Supple. No carotid bruits. No JVD. °Heart: RRR. Distinct S1 and S2. No murmurs, gallops, or rubs. Radial pedal pulses 2+ and equal bilaterally. °Lungs: No increased work of breathing. Clear to ausculation bilaterally. No wheezes, rhonchi, or rales.  °Abdomen: Soft, non-distended, and non-tender to palpation. Bowel sounds present. °Extremities: No lower extremity edema.    °Skin: Warm and dry. °Neuro: Alert and oriented x3. No focal deficits. °Psych: Normal affect. Responds appropriately. ° ° °Assessment:   ° °1. DOE (dyspnea on exertion)   °2. Chest pain, unspecified type   °3. Coronary artery disease involving native coronary artery of native heart without angina pectoris   °4. Hx of CABG   °5. Chronic diastolic CHF (congestive heart failure) (HCC)   °6. Dizziness     °7. Palpitations   °8. Essential hypertension   °9. Mixed hyperlipidemia   °10. Uncontrolled type 2 diabetes mellitus with hyperglycemia (HCC)   ° ° °Plan:   ° °Dyspnea on Exertion and Chest Pain °- Patient reports multiple symptoms today including worsening dyspnea on exertion with associated chest pain as well progressive weakness over the last several months.  He states the symptoms are similar to how he felt before his CABG last year. °- EKG today shows no acute ischemic changes. °- No signs of CHF on exam. °- Will check BNP, CBC, CMET, and TSH today. °- This is a difficult situation. Patient has multiple complaints today - many of which could very well be well be due to his poorly controlled diabetes. However, given his history and progressive dyspnea with associated chest pain, I do think we should rule out progressive CAD. I discussed patient with Dr. Croitoru, his primary Cardiologist, and we decided to go ahead and proceed with a right/left heart catheterization to get a definitive answer. The patient understands that risks include but are not limited to stroke (1 in 1000), death (1 in 1000), kidney failure [usually temporary] (1 in 500), bleeding (1 in 200), allergic reaction [possibly serious] (1 in 200), and agrees to proceed.  ° °CAD s/p CABG °- s/p CABG x2 with LIMA to LAD and SVG to RI in 10/2018.  °- Continue Aspirin 81mg daily and Lipitor 80mg daily.  °- Previously on Lopressor 12.5mg twice daily but stopped taking   this because he felt he was on too many medications. Given palpitations, will restart this.  ° °Chronic Diastolic CHF °- Echo at [facility] in 04/2019 showed LVEF of 55-60% with normal wall motion and grade 1 diastolic dysfunction.  °- He does reports dyspnea on exertion but no symptoms at rest and no other symptoms of heart failure. He does not appear volume overloaded on exam.  °- Continue Lasix 80mg daily and Spironolactone 50mg daily. °- Will check BNP.  °- Plan is for right/left  heart cath next week.  ° °Dizziness / Palpitations °- Patient reports frequent episodes of dizziness with associated palpitations. Occurs multiple times per day.This dizziness could very well be due to severe hyperglycemia. However, will order 5 day Zio monitor.  ° °Hypertension °- BP well controlled at 130/80.  °- Continue Spironolactone and restart Lopressor as above.  ° °Hyperlipidemia °- Lipid panel from 07/2019 (Care Everywhere: Total Cholesterol 209, Triglycerides 977, HDL 24, LDL unable to be calculated due to triglycerides.  °- LDL goal <70 given CAD.  °- Triglyceride level also puts him at risk for pancreatitis. However, priority at this point is still glucose control.  °- Continue Lipitor 80mg daily.  °- Discussed the importance of diet modification.  ° °Type 2 Diabetes Mellitus - Poorly Controlled °- Hemoglobin A1c 14.8 in 09/2019. He states his blood sugars often run in the 600's to 700's.  °- Insulin regimen adjusted at last visit with Endocrinologist last month. However, patient admitted to not always being compliant with his insulin °- Patient also continues to eat a diet high in starches, carbs, and sugar. He has very little insight into the significance of his diabetes and the role that it plays in his overall health. I had a long honest discussion with him about the importance of getting his diabetes under control. °- Patient agreeable to referral to Nutritionist.  °- Follow-up with Endocrinologist as directed.  ° ° °Disposition: Keep virtual visit with Dr. Croitoru on 12/22/2019 ° ° °Medication Adjustments/Labs and Tests Ordered: °Current medicines are reviewed at length with the patient today.  Concerns regarding medicines are outlined above.  °Orders Placed This Encounter  °Procedures  °• Brain natriuretic peptide  °• TSH  °• CBC  °• Comprehensive metabolic panel  °• Ambulatory referral to Nutrition and Diabetic Education  °• LONG TERM MONITOR (3-14 DAYS)  ° °Meds ordered this encounter   °Medications  °• metoprolol tartrate (LOPRESSOR) 25 MG tablet  °  Sig: Take 0.5 tablets (12.5 mg total) by mouth 2 (two) times daily.  °  Dispense:  90 tablet  °  Refill:  1  ° ° °Patient Instructions  °Medication Instructions:  ° °Restart Metoprolol 25 mg---take 1/2 tablet (12.5 mg) two times daily ° °*If you need a refill on your cardiac medications before your next appointment, please call your pharmacy* ° ° °Lab Work: °Your physician recommends that you return for lab work today: BNP, TSH, CBC, CMET ° °If you have labs (blood work) drawn today and your tests are completely normal, you will receive your results only by: °• MyChart Message (if you have MyChart) OR °• A paper copy in the mail °If you have any lab test that is abnormal or we need to change your treatment, we will call you to review the results. ° ° °Testing/Procedures: °ZIO XT- Long Term Monitor Instructions  ° °Your physician has requested you wear your ZIO patch monitor____5___days.  ° °This is a single patch monitor.  [employer] supplies   one patch monitor per enrollment.  Additional stickers are not available. °  °Please do not apply patch if you will be having a Nuclear Stress Test, Echocardiogram, Cardiac CT, MRI, or Chest Xray during the time frame you would be wearing the monitor. The patch cannot be worn during these tests.  You cannot remove and re-apply the ZIO XT patch monitor. °  °Your ZIO patch monitor will be sent USPS Priority mail from [employer] directly to your home address. The monitor may also be mailed to a PO BOX if home delivery is not available.   It may take 3-5 days to receive your monitor after you have been enrolled. °  °Once you have received you monitor, please review enclosed instructions.  Your monitor has already been registered assigning a specific monitor serial # to you. °  °Applying the monitor  ° °Shave hair from upper left chest. °  °Hold abrader disc by orange tab.  Rub abrader in 40 strokes over left  upper chest as indicated in your monitor instructions. °  °Clean area with 4 enclosed alcohol pads .  Use all pads to assure are is cleaned thoroughly.  Let dry.  ° °Apply patch as indicated in monitor instructions.  Patch will be place under collarbone on left side of chest with arrow pointing upward. °  °Rub patch adhesive wings for 2 minutes.Remove white label marked "1".  Remove white label marked "2".  Rub patch adhesive wings for 2 additional minutes. °  °While looking in a mirror, press and release button in center of patch.  A small green light will flash 3-4 times .  This will be your only indicator the monitor has been turned on. °    °Do not shower for the first 24 hours.  You may shower after the first 24 hours. °  °Press button if you feel a symptom. You will hear a small click.  Record Date, Time and Symptom in the Patient Log Book. °  °When you are ready to remove patch, follow instructions on last 2 pages of Patient Log Book.  Stick patch monitor onto last page of Patient Log Book. °  °Place Patient Log Book in Blue box.  Use locking tab on box and tape box closed securely.  The Orange and White box has prepaid postage on it.  Please place in mailbox as soon as possible.  Your physician should have your test results approximately 7 days after the monitor has been mailed back to [employer]. °  °Call [employer] Customer Care at [phone] if you have questions regarding your ZIO XT patch monitor.  Call them immediately if you see an orange light blinking on your monitor. °  °If your monitor falls off in less than 4 days contact our Monitor department at [phone].  If your monitor becomes loose or falls off after 4 days call [employer] at [phone] for suggestions on securing your monitor.  ° ° ° ° °[facility] °[facility] °[address] °[place] [address] °Dept: [phone] °Loc: [phone] ° °Jalyn  Woulfe  12/09/2019 ° °You are scheduled for a Cardiac Catheterization on Friday, April 16 with Dr. David Harding. ° °1. Please arrive at the [facility] (Main Entrance A) at [facility]: [address] [place], [place] [address] at 5:30 AM (This time is two hours before your procedure to ensure your preparation). Free valet parking service is available.  ° °Special note:   Every effort is made to have your procedure done on time. Please understand that emergencies sometimes delay scheduled procedures. ° °2. Diet: Do not eat solid foods after midnight.  The patient may have clear liquids until 5am upon the day of the procedure. ° °3. Labs: You will need to have blood drawn today. You do not need to be fasting. ° °4. Medication instructions in preparation for your procedure: ° °Stop taking, Lasix (Furosemide)  Thursday, April 15, ° °Take ONLY 1/2 DOSE of insulin the night before your procedure. DO NOT TAKE ANY INSULIN THE MORNING OF YOUR PROCEDURE. ° °Do not take Diabetes Med Jardiance and Glipizide on the day of the procedure and HOLD 48 HOURS AFTER THE PROCEDURE. ° °On the morning of your procedure, take any morning medicines NOT listed above.  You may use sips of water. ° °5. Plan for one night stay--bring personal belongings. °6. Bring a current list of your medications and current insurance cards. °7. You MUST have a responsible person to drive you home. °8. Someone MUST be with you the first 24 hours after you arrive home or your discharge will be delayed. °9. Please wear clothes that are easy to get on and off and wear slip-on shoes. ° °Thank you for allowing us to care for you! °  -- [facility] ° °COVID TEST: Scheduled on Tuesday 12/13/19 at 10:20 AM at [address], [place]. ° °Follow-Up: °At [facility], you and your health needs are our priority.  As part of our continuing mission to provide you with exceptional heart care, we have created designated  Provider Care Teams.  These Care Teams include your primary Cardiologist (physician) and Advanced Practice Providers (APPs -  Physician Assistants and Nurse Practitioners) who all work together to provide you with the care you need, when you need it. ° °We recommend signing up for the patient portal called "MyChart".  Sign up information is provided on this After Visit Summary.  MyChart is used to connect with patients for Virtual Visits (Telemedicine).  Patients are able to view lab/test results, encounter notes, upcoming appointments, etc.  Non-urgent messages can be sent to your provider as well.   °To learn more about what you can do with MyChart, go to [email or web address].   ° °Your next appointment:   °Please keep your follow-up appointment scheduled with Dr. Croitoru on 12/22/19---Virtual. ° °Other Instructions °You have been referred to a Nutrition specialist. They will contact you to schedule an appointment. ° °  ° °Signed, °Barri Neidlinger E Andrewjames Weirauch, PA-C  °12/09/2019 7:56 PM    °[facility] °

## 2019-12-08 NOTE — Telephone Encounter (Signed)
  Pt c/o Shortness Of Breath: STAT if SOB developed within the last 24 hours or pt is noticeably SOB on the phone  1. Are you currently SOB (can you hear that pt is SOB on the phone)? no  2. How long have you been experiencing SOB? Few months  3. Are you SOB when sitting or when up moving around? Moving around  4. Are you currently experiencing any other symptoms? Dizzy,  cold all the time  Patient calling requesting a nurse call his wife Luanne Bras to discuss his symptoms at [phone]. He states he would like to go back on some medications and she can discuss it in more detail. He states he has been SOB for the last few months and has been getting dizzy and cold. He also states he can't walk 50 ft without getting out of breath. He states his BP has been normal around 100/80.

## 2019-12-08 NOTE — Telephone Encounter (Signed)
He has very poorly controlled DM, which may have something to do with it. Check TSH also.

## 2019-12-08 NOTE — Telephone Encounter (Signed)
Pts wife, Luanne Bras, ok to talk to per pt, called to report the pt has been very weak, tired, and SOB with minimal exertion for the past few weeks. She says he is light headed and is not thriving well at home. She is concerned because he was at [facility] in the Fall for rehab and told he was in heart failure. He takes his diuretics daily and no improvement in his SOB... she denies peripheral edema.   He is 10 days into being treated with antibiotics for a UTI. He is not having any other sx such as frequency.   She says he is "cold" all of the time and is eating and drinking some but not as much as he used to in the past.   He denies palpitations and chest pain. She is worried about the weakness and would like him seen asap. He has virtual visit with Dr. Royann Shivers 12/22/19 but needs to be seen in person. Appt made with Marjie Skiff 12/09/19.

## 2019-12-08 NOTE — Telephone Encounter (Signed)
LMTCB

## 2019-12-08 NOTE — H&P (View-Only) (Signed)
Cardiology Office Note:    Date:  12/09/2019   ID:  Eric [name], DOB 1961-08-14, MRN [id number]  PCP:  Marlowe-Rogers, Hidi, MD  Cardiologist:  Eric Fair, MD  Electrophysiologist:  None   Referring MD: Eric Coco, MD   Chief Complaint: weakness and shortness of breath  History of Present Illness:    Eric [name] is a 59 y.o. 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 Royann [name] and presents today for evaluation of shortness of breath and weakness.  Patient underwent left heart catheterization in 10/2018 which showed significant ostial LAD stenosis at a sharp angle arising form the left main with 70% proximal optional diagonal stenosis. CT surgery was consulted and patient underwent CABG x2 with LIMA to LAD and SVG to RI on 11/10/2018. Patient then hospitalized at [facility] in 04/2019 with diastolic CHF that improved with diuresis. Echo at that time showed normal LVEF and Myoview did not show ischemia. Diabetes has been poorly controlled lately Glucose in the 400 to 600 range on BMET in Care Everywhere and Hemoglobin A1c of 13.6 in 07/2019. Patient was last seen by Dr. Royann [name] on 08/11/2019 at which time he reported exertional dyspnea with minimal activity such as walking 75 to 100 feet as well as bendopnea. However, not chest pain or other signs of CHF. Weight was actually lower than previous visit; therefore. Dr. Royann [name] stopped his Metformin and started Invokamet at that time.   Patient was seen by his PCP on 12/01/2019 with urinary symptoms and diagnosed with UT. He was started on a 10 day course of Cipro as well as Flomax.   Patient's wife called our office yesterday with concerned for weakness/fatigue and shortness of breath with exertion;  therefore, today's visit was scheduled.   Here today with wife. Patient notes progressive weakness and shortness of breath with exertion over the last 6 months. He states he used to be able to walk a mile and a half but now he can barely walk 50 feet or walk up short flight of steps without getting very short of breath and having to stop.  He also notes associated chest discomfort with this that at times is radiated down his left arm.  No chest pain at rest but he does have some chest wall tenderness.  He denies any orthopnea and is not sure if he has had any PND.  No lower extremity edema.  He states he has actually been losing weight and states he is gone from 210 lbs to 180 lbs in about a month.  However, his wife is not sure if this is accurate.  He is down about 5 lbs since his last office visit in 08/2019.  He also reports a significant dry productive cough that he has had for several months but denies any fevers or aches.  No nasal congestion. He notes shortness of breath, cough, and chest pain are all similar to symptoms he had prior to his CABG. His wife is also concerned that he has been more unsteady on his feet.  He has frequent episodes of dizziness where sometimes he ends up falling down.  He does note some palpitations with these events but has difficult elaborating on these episodes. No syncope though.   We had a long and honest discussion about patient's poorly  controlled diabetes.  He seems to have very little insight into just how severe his diabetes it is and the impact that that plays on the rest of his health.  He continues to eat foods that are high in starches, carbs, sugars.  He also admitted to me that he is not always compliant with his insulin.  His hemoglobin A1c in 09/2019 was 14.8.  He recently saw his Endocrinologist at [facility] last month who suspected that patient was not compliant with his insulin given how high his blood sugars have been.  Patient states that his blood  sugars are often in the 600s and 700s but states his meter just "reads high."  Patient does describe symptoms of polyuria and polydipsia.  Past Medical History:  Diagnosis Date   Coronary artery disease    Diabetes mellitus without complication (HCC)    Fatty liver    Hyperlipidemia    Stroke [facility])     Past Surgical History:  Procedure Laterality Date   CHOLECYSTECTOMY     CORONARY ARTERY BYPASS GRAFT N/A 11/10/2018   Procedure: CORONARY ARTERY BYPASS GRAFTING (CABG) x Two , using left internal mammary artery and right leg greater saphenous vein harvested endoscopically - SVG to Ramus, LIMA to LAD;  Surgeon: Delight Ovens, MD;  Location: [facility] OR;  Service: Open Heart Surgery;  Laterality: N/A;   LEFT HEART CATH AND CORONARY ANGIOGRAPHY N/A 11/09/2018   Procedure: LEFT HEART CATH AND CORONARY ANGIOGRAPHY;  Surgeon: Lennette Bihari, MD;  Location: [facility] INVASIVE CV LAB;  Service: Cardiovascular;  Laterality: N/A;   SHOULDER SURGERY Left    TEE WITHOUT CARDIOVERSION N/A 11/10/2018   Procedure: TRANSESOPHAGEAL ECHOCARDIOGRAM (TEE);  Surgeon: Delight Ovens, MD;  Location: [facility] OR;  Service: Open Heart Surgery;  Laterality: N/A;    Current Medications: Current Meds  Medication Sig   ACCU-CHEK AVIVA PLUS test strip    allopurinol (ZYLOPRIM) 300 MG tablet TAKE 1 TABLET BY MOUTH  DAILY   atorvastatin (LIPITOR) 80 MG tablet Take by mouth.   ciprofloxacin (CIPRO) 500 MG tablet Take by mouth.   diphenhydrAMINE (SOMINEX) 25 MG tablet Take by mouth.   Easy Comfort Lancets MISC    furosemide (LASIX) 40 MG tablet Take by mouth.   gabapentin (NEURONTIN) 400 MG capsule TAKE 2 CAPSULES BY MOUTH 3  TIMES DAILY   glipiZIDE (GLUCOTROL) 5 MG tablet Take 5 mg by mouth daily.   HYDROcodone-acetaminophen (NORCO/VICODIN) 5-325 MG tablet Take by mouth.   insulin aspart (FIASP FLEXTOUCH) 100 UNIT/ML FlexTouch Pen Take as directed   insulin glargine, 2 Unit Dial, (TOUJEO MAX) 300 UNIT/ML  Solostar Pen Take as directed. Up to 120 units daily.   Insulin Pen Needle (B-D ULTRAFINE III SHORT PEN) 31G X 8 MM MISC Use to inject insulin 1 time daily   insulin regular (NOVOLIN R) 100 units/mL injection Inject 35 units TID before meals, per Endocrinology   JARDIANCE 10 MG TABS tablet Take 10 mg by mouth daily.   naproxen (NAPROSYN) 500 MG tablet Take 500 mg by mouth 2 (two) times daily as needed.   omeprazole (PRILOSEC) 20 MG capsule TAKE 2 CAPSULES BY MOUTH ONCE DAILY FOR  ACID  REFLUX   spironolactone (ALDACTONE) 25 MG tablet Take 2 tablets (50 mg total) by mouth daily.   tamsulosin (FLOMAX) 0.4 MG CAPS capsule Take by mouth.     Allergies:   Bee venom and Lisinopril   Social History   Socioeconomic History   Marital status: Married  Spouse name: Luanne BrasLizzie    Number of children: 3   Years of education: Not on file   Highest education level: Not on file  Occupational History   Occupation: disability   Tobacco Use   Smoking status: Former Smoker    Packs/day: 1.00    Years: 12.00    Pack years: 12.00    Types: Cigarettes    Start date: 1968    Quit date: 1981    Years since quitting: 40.2   Smokeless tobacco: Current User    Types: Chew  Substance and Sexual Activity   Alcohol use: Yes    Comment: rare social couple of times a year   Drug use: Never   Sexual activity: Not on file  Other Topics Concern   Not on file  Social History Narrative   Smoked from 1st grade to 12th grade then quit   Social Determinants of Health   Financial Resource Strain:    Difficulty of Paying Living Expenses:   Food Insecurity:    Worried About Programme researcher, broadcasting/film/videounning Out of Food in the Last Year:    Baristaan Out of Food in the Last Year:   Transportation Needs:    Freight forwarderLack of Transportation (Medical):    Lack of Transportation (Non-Medical):   Physical Activity:    Days of Exercise per Week:    Minutes of Exercise per Session:   Stress:    Feeling of Stress :   Social  Connections:    Frequency of Communication with Friends and Family:    Frequency of Social Gatherings with Friends and Family:    Attends Religious Services:    Active Member of Clubs or Organizations:    Attends BankerClub or Organization Meetings:    Marital Status:      Family History: The patient's family history includes Heart attack in his father; Heart failure in his father; Hyperlipidemia in his mother; Hypothyroidism in his mother.  ROS:   Please see the history of present illness.     EKGs/Labs/Other Studies Reviewed:    The following studies were reviewed today:  Echocardiogram 10/14/2018: Impressions: 1. The left ventricle has normal systolic function with an ejection  fraction of 60-65%. The cavity size was normal. Left ventricular diastolic  Doppler parameters are consistent with impaired relaxation No evidence of  left ventricular regional wall  motion abnormalities.  2. The mitral valve is normal in structure. There is mild calcification.  No evidence of mitral valve stenosis. No regurgitation.  3. The tricuspid valve is normal in structure.  4. The aortic valve is tricuspid There is mild calcification of the  aortic valve. No stenosis.  5. The pulmonic valve was normal in structure.  6. The aortic root is normal in size and structure.  7. There is mild dilatation of the ascending aorta measuring 39 mm.  8. No evidence of left ventricular regional wall motion abnormalities.  9. Right atrial pressure is estimated at 3 mmHg.  10. No complete TR doppler jet so unable to estimate PA systolic pressure. _______________  Left Heart Catheterization 11/09/2018:   Ost Ramus to Ramus lesion is 70% stenosed.  Dist LM to Ost LAD lesion is 85% stenosed.   Significant ostial 85% LAD stenosis at a sharp angle arising from the left main with 70% proximal optional diagonal stenosis; normal left circumflex and normal large dominant RCA.  Low normal global LV  function with mild anterolateral hypocontractility with an EF of approximately 50 to 55%.  LVEDP 8 mmHg.  Recommendation: Surgical consult for CABG revascularization surgery.  Will change lovastatin to atorvastatin 80 mg.   EKG:  EKG ordered today. EKG personally reviewed and demonstrates normal sinus rhythm, rate 88 bpm, with some non-specific T wave flattening but no concerning St/T changes. Borderline right axis deviation. PR and QRS intervals normal. QTc 471 ms.   Recent Labs: 09/27/2019: BUN 34; Creatinine, Ser 1.59; Potassium 5.1; Sodium 128  Recent Lipid Panel    Component Value Date/Time   CHOL 228 (H) 11/05/2018 1607   TRIG 587 (HH) 11/05/2018 1607   HDL 33 (L) 11/05/2018 1607   CHOLHDL 6.9 (H) 11/05/2018 1607   LDLCALC Comment 11/05/2018 1607    Physical Exam:    Vital Signs: BP 130/80    Pulse 88    Temp (!) 97 F (36.1 C)    Ht 5' 6.5" (1.689 m)    Wt 197 lb 9.6 oz (89.6 kg)    BMI 31.42 kg/m     Wt Readings from Last 3 Encounters:  12/09/19 197 lb 9.6 oz (89.6 kg)  08/11/19 202 lb (91.6 kg)  07/27/19 206 lb (93.4 kg)     General: 59 y.o. male in no acute distress. HEENT: Normocephalic and atraumatic. Neck: Supple. No carotid bruits. No JVD. Heart: RRR. Distinct S1 and S2. No murmurs, gallops, or rubs. Radial pedal pulses 2+ and equal bilaterally. Lungs: No increased work of breathing. Clear to ausculation bilaterally. No wheezes, rhonchi, or rales.  Abdomen: Soft, non-distended, and non-tender to palpation. Bowel sounds present. Extremities: No lower extremity edema.    Skin: Warm and dry. Neuro: Alert and oriented x3. No focal deficits. Psych: Normal affect. Responds appropriately.   Assessment:    1. DOE (dyspnea on exertion)   2. Chest pain, unspecified type   3. Coronary artery disease involving native coronary artery of native heart without angina pectoris   4. Hx of CABG   5. Chronic diastolic CHF (congestive heart failure) (HCC)   6. Dizziness     7. Palpitations   8. Essential hypertension   9. Mixed hyperlipidemia   10. Uncontrolled type 2 diabetes mellitus with hyperglycemia (HCC)     Plan:    Dyspnea on Exertion and Chest Pain - Patient reports multiple symptoms today including worsening dyspnea on exertion with associated chest pain as well progressive weakness over the last several months.  He states the symptoms are similar to how he felt before his CABG last year. - EKG today shows no acute ischemic changes. - No signs of CHF on exam. - Will check BNP, CBC, CMET, and TSH today. - This is a difficult situation. Patient has multiple complaints today - many of which could very well be well be due to his poorly controlled diabetes. However, given his history and progressive dyspnea with associated chest pain, I do think we should rule out progressive CAD. I discussed patient with Dr. Royann [name], his primary Cardiologist, and we decided to go ahead and proceed with a right/left heart catheterization to get a definitive answer. The patient understands that risks include but are not limited to stroke (1 in 1000), death (1 in 1000), kidney failure [usually temporary] (1 in 500), bleeding (1 in 200), allergic reaction [possibly serious] (1 in 200), and agrees to proceed.   CAD s/p CABG - s/p CABG x2 with LIMA to LAD and SVG to RI in 10/2018.  - Continue Aspirin 81mg  daily and Lipitor 80mg  daily.  - Previously on Lopressor 12.5mg  twice daily but stopped taking  this because he felt he was on too many medications. Given palpitations, will restart this.   Chronic Diastolic CHF - Echo at [facility] in 04/2019 showed LVEF of 55-60% with normal wall motion and grade 1 diastolic dysfunction.  - He does reports dyspnea on exertion but no symptoms at rest and no other symptoms of heart failure. He does not appear volume overloaded on exam.  - Continue Lasix  daily and Spironolactone  daily. - Will check BNP.  - Plan is for right/left  heart cath next week.   Dizziness / Palpitations - Patient reports frequent episodes of dizziness with associated palpitations. Occurs multiple times per day.This dizziness could very well be due to severe hyperglycemia. However, will order 5 day Zio monitor.   Hypertension - BP well controlled at 130/80.  - Continue Spironolactone and restart Lopressor as above.   Hyperlipidemia - Lipid panel from 07/2019 (Care Everywhere: Total Cholesterol 209, Triglycerides 977, HDL 24, LDL unable to be calculated due to triglycerides.  - LDL goal <70 given CAD.  - Triglyceride level also puts him at risk for pancreatitis. However, priority at this point is still glucose control.  - Continue Lipitor  daily.  - Discussed the importance of diet modification.   Type 2 Diabetes Mellitus - Poorly Controlled - Hemoglobin A1c 14.8 in 09/2019. He states his blood sugars often run in the 600's to 700's.  - Insulin regimen adjusted at last visit with Endocrinologist last month. However, patient admitted to not always being compliant with his insulin - Patient also continues to eat a diet high in starches, carbs, and sugar. He has very little insight into the significance of his diabetes and the role that it plays in his overall health. I had a long honest discussion with him about the importance of getting his diabetes under control. - Patient agreeable to referral to Nutritionist.  - Follow-up with Endocrinologist as directed.    Disposition: Keep virtual visit with Dr. Royann [name] on 12/22/2019   Medication Adjustments/Labs and Tests Ordered: Current medicines are reviewed at length with the patient today.  Concerns regarding medicines are outlined above.  Orders Placed This Encounter  Procedures   Brain natriuretic peptide   TSH   CBC   Comprehensive metabolic panel   Ambulatory referral to Nutrition and Diabetic Education   LONG TERM MONITOR (3-14 DAYS)   Meds ordered this encounter   Medications   metoprolol tartrate (LOPRESSOR) 25 MG tablet    Sig: Take 0.5 tablets (12.5 mg total) by mouth 2 (two) times daily.    Dispense:  90 tablet    Refill:  1    Patient Instructions  Medication Instructions:   Restart Metoprolol 25 mg---take 1/2 tablet (12.5 mg) two times daily  *If you need a refill on your cardiac medications before your next appointment, please call your pharmacy*   Lab Work: Your physician recommends that you return for lab work today: BNP, TSH, CBC, CMET  If you have labs (blood work) drawn today and your tests are completely normal, you will receive your results only by:  MyChart Message (if you have MyChart) OR  A paper copy in the mail If you have any lab test that is abnormal or we need to change your treatment, we will call you to review the results.   Testing/Procedures: Christena Deem- Long Term Monitor Instructions   Your physician has requested you wear your ZIO patch monitor____5___days.   This is a single patch monitor.  [employer] supplies  one patch monitor per enrollment.  Additional stickers are not available.   Please do not apply patch if you will be having a Nuclear Stress Test, Echocardiogram, Cardiac CT, MRI, or Chest Xray during the time frame you would be wearing the monitor. The patch cannot be worn during these tests.  You cannot remove and re-apply the ZIO XT patch monitor.   Your ZIO patch monitor will be sent USPS Priority mail from [facility] directly to your home address. The monitor may also be mailed to a PO BOX if home delivery is not available.   It may take 3-5 days to receive your monitor after you have been enrolled.   Once you have received you monitor, please review enclosed instructions.  Your monitor has already been registered assigning a specific monitor serial # to you.   Applying the monitor   Shave hair from upper left chest.   Hold abrader disc by orange tab.  Rub abrader in 40 strokes over left  upper chest as indicated in your monitor instructions.   Clean area with 4 enclosed alcohol pads .  Use all pads to assure are is cleaned thoroughly.  Let dry.   Apply patch as indicated in monitor instructions.  Patch will be place under collarbone on left side of chest with arrow pointing upward.   Rub patch adhesive wings for 2 minutes.Remove white label marked "1".  Remove white label marked "2".  Rub patch adhesive wings for 2 additional minutes.   While looking in a mirror, press and release button in center of patch.  A small green light will flash 3-4 times .  This will be your only indicator the monitor has been turned on.     Do not shower for the first 24 hours.  You may shower after the first 24 hours.   Press button if you feel a symptom. You will hear a small click.  Record Date, Time and Symptom in the Patient Log Book.   When you are ready to remove patch, follow instructions on last 2 pages of Patient Log Book.  Stick patch monitor onto last page of Patient Log Book.   Place Patient Log Book in [place] box.  Use locking tab on box and tape box closed securely.  The [employer] and [employer] has [employer] on it.  Please place in mailbox as soon as possible.  Your physician should have your test results approximately 7 days after the monitor has been mailed back to [facility].   Call [place] at [phone] if you have questions regarding your ZIO XT patch monitor.  Call them immediately if you see an orange light blinking on your monitor.   If your monitor falls off in less than 4 days contact our Monitor department at [phone].  If your monitor becomes loose or falls off after 4 days call [employer] at [phone] for suggestions on securing your monitor.      [place] [address] Dept: [phone] Loc: [place]  12/09/2019  You are scheduled for a Cardiac Catheterization on Friday, April 16 with Dr. Glenetta Hew.  1. Please arrive at the [facility] (Main Entrance A) at [facility]: [address] [place], [place] [address] at 5:30 AM (This time is two hours before your procedure to ensure your preparation). Free valet parking service is available.   Special note:  Every effort is made to have your procedure done on time. Please understand that emergencies sometimes delay scheduled procedures.  2. Diet: Do not eat solid foods after midnight.  The patient may have clear liquids until 5am upon the day of the procedure.  3. Labs: You will need to have blood drawn today. You do not need to be fasting.  4. Medication instructions in preparation for your procedure:  Stop taking, Lasix (Furosemide)  Thursday, April 15,  Take ONLY 1/2 DOSE of insulin the night before your procedure. DO NOT TAKE ANY INSULIN THE MORNING OF YOUR PROCEDURE.  Do not take Diabetes Med Jardiance and Glipizide on the day of the procedure and HOLD 48 HOURS AFTER THE PROCEDURE.  On the morning of your procedure, take any morning medicines NOT listed above.  You may use sips of water.  5. Plan for one night stay--bring personal belongings. 6. Bring a current list of your medications and current insurance cards. 7. You MUST have a responsible person to drive you home. 8. Someone MUST be with you the first 24 hours after you arrive home or your discharge will be delayed. 9. Please wear clothes that are easy to get on and off and wear slip-on shoes.  Thank you for allowing [place] to care for you!   -- [place] [facility]  COVID TEST: Scheduled on Tuesday 12/13/19 at 10:20 AM at [address] [facility] [place].  Follow-Up: At [facility] [place], you and your health needs are our priority.  As part of our continuing mission to provide you with exceptional heart care, we have created designated  Provider Care Teams.  These Care Teams include your primary Cardiologist (physician) and Advanced Practice Providers (APPs -  Physician Assistants and Nurse Practitioners) who all work together to provide you with the care you need, when you need it.  We recommend signing up for the patient portal called "MyChart".  Sign up information is provided on this After Visit Summary.  MyChart is used to connect with patients for Virtual Visits (Telemedicine).  Patients are able to view lab/test results, encounter notes, upcoming appointments, etc.  Non-urgent messages can be sent to your provider as well.   To learn more about what you can do with MyChart, go to [email or web address].    Your next appointment:   Please keep your follow-up appointment scheduled with Dr. Royann [name] on 12/22/19---Virtual.  Other Instructions You have been referred to a Nutrition specialist. They will contact you to schedule an appointment.     Signed, Corrin Parker, PA-C  12/09/2019 7:56 PM    [facility]

## 2019-12-09 ENCOUNTER — Encounter: Payer: Self-pay | Admitting: Student

## 2019-12-09 ENCOUNTER — Ambulatory Visit: Payer: Medicare Other | Admitting: Student

## 2019-12-09 ENCOUNTER — Other Ambulatory Visit: Payer: Self-pay

## 2019-12-09 VITALS — BP 130/80 | HR 88 | Temp 97.0°F | Ht 66.5 in | Wt 197.6 lb

## 2019-12-09 DIAGNOSIS — Z951 Presence of aortocoronary bypass graft: Secondary | ICD-10-CM | POA: Diagnosis not present

## 2019-12-09 DIAGNOSIS — R0609 Other forms of dyspnea: Secondary | ICD-10-CM

## 2019-12-09 DIAGNOSIS — I2510 Atherosclerotic heart disease of native coronary artery without angina pectoris: Secondary | ICD-10-CM

## 2019-12-09 DIAGNOSIS — R42 Dizziness and giddiness: Secondary | ICD-10-CM

## 2019-12-09 DIAGNOSIS — R079 Chest pain, unspecified: Secondary | ICD-10-CM | POA: Diagnosis not present

## 2019-12-09 DIAGNOSIS — R0600 Dyspnea, unspecified: Secondary | ICD-10-CM

## 2019-12-09 DIAGNOSIS — E1165 Type 2 diabetes mellitus with hyperglycemia: Secondary | ICD-10-CM

## 2019-12-09 DIAGNOSIS — R002 Palpitations: Secondary | ICD-10-CM

## 2019-12-09 DIAGNOSIS — I10 Essential (primary) hypertension: Secondary | ICD-10-CM

## 2019-12-09 DIAGNOSIS — E782 Mixed hyperlipidemia: Secondary | ICD-10-CM

## 2019-12-09 DIAGNOSIS — I5032 Chronic diastolic (congestive) heart failure: Secondary | ICD-10-CM

## 2019-12-09 MED ORDER — METOPROLOL TARTRATE 25 MG PO TABS: 12.5000 mg | ORAL_TABLET | Freq: Two times a day (BID) | ORAL | 1 refills | Status: DC

## 2019-12-09 NOTE — Patient Instructions (Signed)
Medication Instructions:   Restart Metoprolol 25 mg---take 1/2 tablet (12.5 mg) two times daily  *If you need a refill on your cardiac medications before your next appointment, please call your pharmacy*   Lab Work: Your physician recommends that you return for lab work today: BNP, TSH, CBC, CMET  If you have labs (blood work) drawn today and your tests are completely normal, you will receive your results only by: Marland Kitchen MyChart Message (if you have MyChart) OR . A paper copy in the mail If you have any lab test that is abnormal or we need to change your treatment, we will call you to review the results.   Testing/Procedures: Eric [name]- Long Term Monitor Instructions   Your physician has requested you wear your ZIO patch monitor____5___days.   This is a single patch monitor.  [employer] supplies one patch monitor per enrollment.  Additional stickers are not available.   Please do not apply patch if you will be having a Nuclear Stress Test, Echocardiogram, Cardiac CT, MRI, or Chest Xray during the time frame you would be wearing the monitor. The patch cannot be worn during these tests.  You cannot remove and re-apply the ZIO XT patch monitor.   Your ZIO patch monitor will be sent USPS Priority mail from [facility] directly to your home address. The monitor may also be mailed to a PO BOX if home delivery is not available.   It may take 3-5 days to receive your monitor after you have been enrolled.   Once you have received you monitor, please review enclosed instructions.  Your monitor has already been registered assigning a specific monitor serial # to you.   Applying the monitor   Shave hair from upper left chest.   Hold abrader disc by orange tab.  Rub abrader in 40 strokes over left upper chest as indicated in your monitor instructions.   Clean area with 4 enclosed alcohol pads .  Use all pads to assure are is cleaned thoroughly.  Let dry.   Apply patch as indicated in monitor  instructions.  Patch will be place under collarbone on left side of chest with arrow pointing upward.   Rub patch adhesive wings for 2 minutes.Remove white label marked "1".  Remove white label marked "2".  Rub patch adhesive wings for 2 additional minutes.   While looking in a mirror, press and release button in center of patch.  A small green light will flash 3-4 times .  This will be your only indicator the monitor has been turned on.     Do not shower for the first 24 hours.  You may shower after the first 24 hours.   Press button if you feel a symptom. You will hear a small click.  Record Date, Time and Symptom in the Patient Log Book.   When you are ready to remove patch, follow instructions on last 2 pages of Patient Log Book.  Stick patch monitor onto last page of Patient Log Book.   Place Patient Log Book in [place] box.  Use locking tab on box and tape box closed securely.  The [employer] and [employer] has [employer] on it.  Please place in mailbox as soon as possible.  Your physician should have your test results approximately 7 days after the monitor has been mailed back to [facility].   Call [facility] Customer Care at [phone] if you have questions regarding your ZIO XT patch monitor.  Call them immediately if you  see an orange light blinking on your monitor.   If your monitor falls off in less than 4 days contact our Monitor department at [phone].  If your monitor becomes loose or falls off after 4 days call [employer] at [phone] for suggestions on securing your monitor.      [address] [place] [address] Dept: [phone] Loc: [place]  12/09/2019  You are scheduled for a Cardiac Catheterization on Friday, April 16 with Dr. Glenetta Hew.  1. Please arrive at the [facility] (Main Entrance A) at [facility]: [address] [place], [place] [address] at 5:30 AM (This time is two hours before your procedure to ensure your preparation). Free valet parking service is available.   Special note: Every effort is made to have your procedure done on time. Please understand that emergencies sometimes delay scheduled procedures.  2. Diet: Do not eat solid foods after midnight.  The patient may have clear liquids until 5am upon the day of the procedure.  3. Labs: You will need to have blood drawn today. You do not need to be fasting.  4. Medication instructions in preparation for your procedure:  Stop taking, Lasix (Furosemide)  Thursday, April 15,  Take ONLY 1/2 DOSE of insulin the night before your procedure. DO NOT TAKE ANY INSULIN THE MORNING OF YOUR PROCEDURE.  Do not take Diabetes Med Jardiance and Glipizide on the day of the procedure and HOLD 48 HOURS AFTER THE PROCEDURE.  On the morning of your procedure, take any morning medicines NOT listed above.  You may use sips of water.  5. Plan for one night stay--bring personal belongings. 6. Bring a current list of your medications and current insurance cards. 7. You MUST have a responsible person to drive you home. 8. Someone MUST be with you the first 24 hours after you arrive home or your discharge will be delayed. 9. Please wear clothes that are easy to get on and off and wear slip-on shoes.  Thank you for allowing [place] to care for you!   -- [facility]  COVID TEST: Scheduled on Tuesday 12/13/19 at 10:20 AM at [place].  Follow-Up: At [facility], you and your health needs are our priority.  As part of our continuing mission to provide you with exceptional heart care, we have created designated Provider Care Teams.  These Care Teams include your primary Cardiologist (physician) and Advanced Practice Providers (APPs -  Physician Assistants and Nurse Practitioners) who all work together to provide you with  the care you need, when you need it.  We recommend signing up for the patient portal called "MyChart".  Sign up information is provided on this After Visit Summary.  MyChart is used to connect with patients for Virtual Visits (Telemedicine).  Patients are able to view lab/test results, encounter notes, upcoming appointments, etc.  Non-urgent messages can be sent to your provider as well.   To learn more about what you can do with MyChart, go to [email or web address].    Your next appointment:   Please keep your follow-up appointment scheduled with Dr. Sallyanne Kuster on 12/22/19---Virtual.  Other Instructions You have been referred to a Nutrition specialist. They will contact you to schedule an appointment.

## 2019-12-10 LAB — CBC
Hematocrit: 44.7 % (ref 37.5–51.0)
Hemoglobin: 14.8 g/dL (ref 13.0–17.7)
MCH: 29.1 pg (ref 26.6–33.0)
MCHC: 33.1 g/dL (ref 31.5–35.7)
MCV: 88 fL (ref 79–97)
Platelets: 251 10*3/uL (ref 150–450)
RBC: 5.08 x10E6/uL (ref 4.14–5.80)
RDW: 12.8 % (ref 11.6–15.4)
WBC: 9.4 10*3/uL (ref 3.4–10.8)

## 2019-12-10 LAB — TSH: TSH: 0.652 u[IU]/mL (ref 0.450–4.500)

## 2019-12-10 LAB — COMPREHENSIVE METABOLIC PANEL
ALT: 27 IU/L (ref 0–44)
AST: 26 IU/L (ref 0–40)
Albumin/Globulin Ratio: 1 — ABNORMAL LOW (ref 1.2–2.2)
Albumin: 4 g/dL (ref 3.8–4.9)
Alkaline Phosphatase: 126 IU/L — ABNORMAL HIGH (ref 39–117)
BUN/Creatinine Ratio: 19 (ref 9–20)
BUN: 35 mg/dL — ABNORMAL HIGH (ref 6–24)
Bilirubin Total: 0.6 mg/dL (ref 0.0–1.2)
CO2: 21 mmol/L (ref 20–29)
Calcium: 10.1 mg/dL (ref 8.7–10.2)
Chloride: 88 mmol/L — ABNORMAL LOW (ref 96–106)
Creatinine, Ser: 1.88 mg/dL — ABNORMAL HIGH (ref 0.76–1.27)
GFR calc Af Amer: 45 mL/min/{1.73_m2} — ABNORMAL LOW (ref >59)
GFR calc non Af Amer: 39 mL/min/{1.73_m2} — ABNORMAL LOW (ref >59)
Globulin, Total: 4.1 g/dL (ref 1.5–4.5)
Glucose: 608 mg/dL (ref 65–99)
Potassium: 4.6 mmol/L (ref 3.5–5.2)
Sodium: 129 mmol/L — ABNORMAL LOW (ref 134–144)
Total Protein: 8.1 g/dL (ref 6.0–8.5)

## 2019-12-10 LAB — BRAIN NATRIURETIC PEPTIDE: BNP: 13 pg/mL (ref 0.0–100.0)

## 2019-12-12 ENCOUNTER — Telehealth: Payer: Self-pay | Admitting: Cardiovascular Disease

## 2019-12-12 NOTE — Telephone Encounter (Signed)
Patient's wife returned Julie's call in regards to lab results. She then hung up before I could get in touch with Raynelle Fanning. I tried to call back but went to VM - LVM to call back.

## 2019-12-12 NOTE — Telephone Encounter (Signed)
Called and spoke to wife regarding lab work and instruction change for CATH  Wife verbalized understanding, also asked that I send it to [employer].  Lab ordered.

## 2019-12-12 NOTE — Telephone Encounter (Signed)
Patient's wife states she is returning call to [place] for lab results. Please call to discuss.

## 2019-12-12 NOTE — Addendum Note (Signed)
Addended by: Marjie Skiff on: 12/12/2019 11:12 AM   Modules accepted: Orders

## 2019-12-12 NOTE — Addendum Note (Signed)
Addended by: Darene Lamer T on: 12/12/2019 10:34 AM   Modules accepted: Orders

## 2019-12-13 ENCOUNTER — Other Ambulatory Visit (HOSPITAL_COMMUNITY)
Admission: RE | Admit: 2019-12-13 | Discharge: 2019-12-13 | Disposition: A | Discharge status: Home / Self Care | Payer: Medicare Other | Source: Ambulatory Visit | Attending: Cardiology | Admitting: Cardiology

## 2019-12-13 DIAGNOSIS — Z20822 Contact with and (suspected) exposure to covid-19: Secondary | ICD-10-CM | POA: Diagnosis not present

## 2019-12-13 DIAGNOSIS — Z01812 Encounter for preprocedural laboratory examination: Secondary | ICD-10-CM | POA: Insufficient documentation

## 2019-12-13 LAB — SARS CORONAVIRUS 2 (TAT 6-24 HRS): SARS Coronavirus 2: NEGATIVE

## 2019-12-14 ENCOUNTER — Telehealth: Payer: Self-pay | Admitting: *Deleted

## 2019-12-14 NOTE — Telephone Encounter (Addendum)
Pt contacted pre-catheterization scheduled at [facility] for: Friday December 16, 2019 12 noon Verified arrival time and place: [facility] Main Entrance A [facility]) at: 6 AM pre procedure hydration   No solid food after midnight prior to cath, clear liquids until 5 AM day of procedure.  Hold: Jardiance -AM of procedure-not currently taking per pt's wife. Glipizide-AM of procedure Insulin-AM of procedure Lasix-day before and day of procedure-GFR 39 Ibuprofen-day before and day of procedure-GFR 39 Spironolactone-discontinued 12/13/19  Except hold medications AM meds can be  taken pre-cath with sip of water including: ASA 81 mg   Confirmed patient has responsible adult to drive home post procedure and observe 24 hours after arriving home:   Currently, due to Covid-19 pandemic, only one person will be allowed with patient. Must be the same person for patient's entire stay and will be required to wear a mask. They will be asked to wait in the waiting room for the duration of the patient's stay.  Patients are required to wear a mask when they enter the hospital.  I spoke with patient's wife and briefly reviewed procedure instructions, including medications to hold prior to procedure. Pt's wife was at work and asked me to call her late tomorrow afternoon to review general procedure instructions. I will plan to follow up with patient's wife late tomorrow afternoon.

## 2019-12-15 ENCOUNTER — Telehealth: Payer: Self-pay | Admitting: Radiology

## 2019-12-15 NOTE — Telephone Encounter (Signed)
Reviewed procedure/mask/visitor instructions, COVID-19 questions with patient's wife, Luanne Bras.  Pt's wife states patient is hard of hearing, he does lip-read.

## 2019-12-15 NOTE — Telephone Encounter (Signed)
Enrolled patient for a 5 day Zio monitor to be mailed to patients home.

## 2019-12-15 NOTE — Telephone Encounter (Signed)
LMTCB for pt's wife to review general procedure instructions

## 2019-12-16 ENCOUNTER — Ambulatory Visit (HOSPITAL_COMMUNITY)
Admission: RE | Admit: 2019-12-16 | Discharge: 2019-12-16 | Disposition: A | Discharge status: Home / Self Care | Payer: Medicare Other | Attending: Cardiology | Admitting: Cardiology

## 2019-12-16 ENCOUNTER — Encounter (HOSPITAL_COMMUNITY)
Admission: RE | Disposition: A | Discharge status: Home / Self Care | Payer: Self-pay | Source: Home / Self Care | Attending: Cardiology

## 2019-12-16 ENCOUNTER — Other Ambulatory Visit: Payer: Self-pay

## 2019-12-16 DIAGNOSIS — Z7982 Long term (current) use of aspirin: Secondary | ICD-10-CM | POA: Diagnosis not present

## 2019-12-16 DIAGNOSIS — R002 Palpitations: Secondary | ICD-10-CM | POA: Insufficient documentation

## 2019-12-16 DIAGNOSIS — E782 Mixed hyperlipidemia: Secondary | ICD-10-CM | POA: Diagnosis not present

## 2019-12-16 DIAGNOSIS — Z87891 Personal history of nicotine dependence: Secondary | ICD-10-CM | POA: Diagnosis not present

## 2019-12-16 DIAGNOSIS — I25700 Atherosclerosis of coronary artery bypass graft(s), unspecified, with unstable angina pectoris: Secondary | ICD-10-CM | POA: Insufficient documentation

## 2019-12-16 DIAGNOSIS — I110 Hypertensive heart disease with heart failure: Secondary | ICD-10-CM | POA: Diagnosis not present

## 2019-12-16 DIAGNOSIS — I5032 Chronic diastolic (congestive) heart failure: Secondary | ICD-10-CM | POA: Insufficient documentation

## 2019-12-16 DIAGNOSIS — Z951 Presence of aortocoronary bypass graft: Secondary | ICD-10-CM | POA: Diagnosis not present

## 2019-12-16 DIAGNOSIS — R42 Dizziness and giddiness: Secondary | ICD-10-CM | POA: Diagnosis not present

## 2019-12-16 DIAGNOSIS — I2089 Other forms of angina pectoris: Secondary | ICD-10-CM

## 2019-12-16 DIAGNOSIS — I208 Other forms of angina pectoris: Secondary | ICD-10-CM

## 2019-12-16 DIAGNOSIS — I25119 Atherosclerotic heart disease of native coronary artery with unspecified angina pectoris: Secondary | ICD-10-CM

## 2019-12-16 DIAGNOSIS — E1165 Type 2 diabetes mellitus with hyperglycemia: Secondary | ICD-10-CM | POA: Insufficient documentation

## 2019-12-16 DIAGNOSIS — Z8673 Personal history of transient ischemic attack (TIA), and cerebral infarction without residual deficits: Secondary | ICD-10-CM | POA: Insufficient documentation

## 2019-12-16 DIAGNOSIS — Z794 Long term (current) use of insulin: Secondary | ICD-10-CM | POA: Diagnosis not present

## 2019-12-16 DIAGNOSIS — Z79899 Other long term (current) drug therapy: Secondary | ICD-10-CM | POA: Diagnosis not present

## 2019-12-16 DIAGNOSIS — I25719 Atherosclerosis of autologous vein coronary artery bypass graft(s) with unspecified angina pectoris: Secondary | ICD-10-CM

## 2019-12-16 DIAGNOSIS — R0609 Other forms of dyspnea: Secondary | ICD-10-CM | POA: Diagnosis present

## 2019-12-16 DIAGNOSIS — E1140 Type 2 diabetes mellitus with diabetic neuropathy, unspecified: Secondary | ICD-10-CM | POA: Insufficient documentation

## 2019-12-16 DIAGNOSIS — K219 Gastro-esophageal reflux disease without esophagitis: Secondary | ICD-10-CM | POA: Diagnosis not present

## 2019-12-16 DIAGNOSIS — G4733 Obstructive sleep apnea (adult) (pediatric): Secondary | ICD-10-CM | POA: Insufficient documentation

## 2019-12-16 DIAGNOSIS — R0600 Dyspnea, unspecified: Secondary | ICD-10-CM

## 2019-12-16 HISTORY — PX: INTRAVASCULAR PRESSURE WIRE/FFR STUDY: CATH118243

## 2019-12-16 HISTORY — PX: RIGHT/LEFT HEART CATH AND CORONARY/GRAFT ANGIOGRAPHY: CATH118267

## 2019-12-16 LAB — POCT I-STAT EG7
Acid-Base Excess: 1 mmol/L (ref 0.0–2.0)
Acid-Base Excess: 2 mmol/L (ref 0.0–2.0)
Bicarbonate: 27.6 mmol/L (ref 20.0–28.0)
Bicarbonate: 28.5 mmol/L — ABNORMAL HIGH (ref 20.0–28.0)
Calcium, Ion: 1.2 mmol/L (ref 1.15–1.40)
Calcium, Ion: 1.22 mmol/L (ref 1.15–1.40)
HCT: 36 % — ABNORMAL LOW (ref 39.0–52.0)
HCT: 36 % — ABNORMAL LOW (ref 39.0–52.0)
Hemoglobin: 12.2 g/dL — ABNORMAL LOW (ref 13.0–17.0)
Hemoglobin: 12.2 g/dL — ABNORMAL LOW (ref 13.0–17.0)
O2 Saturation: 64 %
O2 Saturation: 66 %
Potassium: 4 mmol/L (ref 3.5–5.1)
Potassium: 4.1 mmol/L (ref 3.5–5.1)
Sodium: 138 mmol/L (ref 135–145)
Sodium: 139 mmol/L (ref 135–145)
TCO2: 29 mmol/L (ref 22–32)
TCO2: 30 mmol/L (ref 22–32)
pCO2, Ven: 51.6 mmHg (ref 44.0–60.0)
pCO2, Ven: 51.8 mmHg (ref 44.0–60.0)
pH, Ven: 7.335 (ref 7.250–7.430)
pH, Ven: 7.349 (ref 7.250–7.430)
pO2, Ven: 36 mmHg (ref 32.0–45.0)
pO2, Ven: 37 mmHg (ref 32.0–45.0)

## 2019-12-16 LAB — GLUCOSE, CAPILLARY
Glucose-Capillary: 428 mg/dL — ABNORMAL HIGH (ref 70–99)
Glucose-Capillary: 388 mg/dL — ABNORMAL HIGH (ref 70–99)

## 2019-12-16 LAB — POCT I-STAT 7, (LYTES, BLD GAS, ICA,H+H)
Acid-Base Excess: 1 mmol/L (ref 0.0–2.0)
Bicarbonate: 26.8 mmol/L (ref 20.0–28.0)
Calcium, Ion: 1.24 mmol/L (ref 1.15–1.40)
HCT: 36 % — ABNORMAL LOW (ref 39.0–52.0)
Hemoglobin: 12.2 g/dL — ABNORMAL LOW (ref 13.0–17.0)
O2 Saturation: 97 %
Potassium: 4.2 mmol/L (ref 3.5–5.1)
Sodium: 137 mmol/L (ref 135–145)
TCO2: 28 mmol/L (ref 22–32)
pCO2 arterial: 47.1 mmHg (ref 32.0–48.0)
pH, Arterial: 7.363 (ref 7.350–7.450)
pO2, Arterial: 91 mmHg (ref 83.0–108.0)

## 2019-12-16 LAB — BASIC METABOLIC PANEL
Anion gap: 9 (ref 5–15)
BUN: 20 mg/dL (ref 6–20)
CO2: 23 mmol/L (ref 22–32)
Calcium: 7.9 mg/dL — ABNORMAL LOW (ref 8.9–10.3)
Chloride: 103 mmol/L (ref 98–111)
Creatinine, Ser: 1.24 mg/dL (ref 0.61–1.24)
GFR calc Af Amer: >60 mL/min (ref >60)
GFR calc non Af Amer: >60 mL/min (ref >60)
Glucose, Bld: 345 mg/dL — ABNORMAL HIGH (ref 70–99)
Potassium: 4 mmol/L (ref 3.5–5.1)
Sodium: 135 mmol/L (ref 135–145)

## 2019-12-16 LAB — POCT ACTIVATED CLOTTING TIME: Activated Clotting Time: 268 seconds

## 2019-12-16 SURGERY — RIGHT/LEFT HEART CATH AND CORONARY/GRAFT ANGIOGRAPHY
Anesthesia: LOCAL

## 2019-12-16 MED ORDER — ACETAMINOPHEN 325 MG PO TABS: 650.0000 mg | ORAL_TABLET | ORAL | Status: DC | PRN

## 2019-12-16 MED ORDER — INSULIN ASPART 100 UNIT/ML ~~LOC~~ SOLN
7.0000 [IU] | Freq: Once | SUBCUTANEOUS | Status: AC
  Administered 2019-12-16: 7 [IU] via SUBCUTANEOUS
  Filled 2019-12-16: qty 0.07

## 2019-12-16 MED ORDER — SODIUM CHLORIDE 0.9% FLUSH: 3.0000 mL | INTRAVENOUS | Status: DC | PRN

## 2019-12-16 MED ORDER — LIDOCAINE HCL (PF) 1 % IJ SOLN
INTRAMUSCULAR | Status: AC
  Filled 2019-12-16: qty 30

## 2019-12-16 MED ORDER — SODIUM CHLORIDE 0.9 % IV SOLN: INTRAVENOUS | Status: AC

## 2019-12-16 MED ORDER — FENTANYL CITRATE (PF) 100 MCG/2ML IJ SOLN
INTRAMUSCULAR | Status: AC
  Filled 2019-12-16: qty 2

## 2019-12-16 MED ORDER — VERAPAMIL HCL 2.5 MG/ML IV SOLN
INTRAVENOUS | Status: DC | PRN
  Administered 2019-12-16: 15:00:00 10 mL via INTRA_ARTERIAL

## 2019-12-16 MED ORDER — FENTANYL CITRATE (PF) 100 MCG/2ML IJ SOLN
INTRAMUSCULAR | Status: DC | PRN
  Administered 2019-12-16: 50 ug via INTRAVENOUS

## 2019-12-16 MED ORDER — HEPARIN (PORCINE) IN NACL 1000-0.9 UT/500ML-% IV SOLN
INTRAVENOUS | Status: DC | PRN
  Administered 2019-12-16 (×2): 500 mL

## 2019-12-16 MED ORDER — HYDRALAZINE HCL 20 MG/ML IJ SOLN: 10.0000 mg | INTRAMUSCULAR | Status: AC | PRN

## 2019-12-16 MED ORDER — VERAPAMIL HCL 2.5 MG/ML IV SOLN
INTRAVENOUS | Status: AC
  Filled 2019-12-16: qty 2

## 2019-12-16 MED ORDER — SODIUM CHLORIDE 0.9 % WEIGHT BASED INFUSION
3.0000 mL/kg/h | INTRAVENOUS | Status: DC
  Administered 2019-12-16: 3 mL/kg/h via INTRAVENOUS

## 2019-12-16 MED ORDER — ASPIRIN 81 MG PO CHEW: 81.0000 mg | CHEWABLE_TABLET | ORAL | Status: DC

## 2019-12-16 MED ORDER — MIDAZOLAM HCL 2 MG/2ML IJ SOLN
INTRAMUSCULAR | Status: AC
  Filled 2019-12-16: qty 2

## 2019-12-16 MED ORDER — SODIUM CHLORIDE 0.9% FLUSH: 3.0000 mL | Freq: Two times a day (BID) | INTRAVENOUS | Status: DC

## 2019-12-16 MED ORDER — INSULIN ASPART 100 UNIT/ML ~~LOC~~ SOLN
SUBCUTANEOUS | Status: AC
  Filled 2019-12-16: qty 1

## 2019-12-16 MED ORDER — HEPARIN SODIUM (PORCINE) 1000 UNIT/ML IJ SOLN
INTRAMUSCULAR | Status: AC
  Filled 2019-12-16: qty 1

## 2019-12-16 MED ORDER — SODIUM CHLORIDE 0.9 % WEIGHT BASED INFUSION
1.0000 mL/kg/h | INTRAVENOUS | Status: DC
  Administered 2019-12-16: 1 mL/kg/h via INTRAVENOUS

## 2019-12-16 MED ORDER — IOHEXOL 350 MG/ML SOLN
INTRAVENOUS | Status: DC | PRN
  Administered 2019-12-16: 130 mL

## 2019-12-16 MED ORDER — SODIUM CHLORIDE 0.9 % IV SOLN: 250.0000 mL | INTRAVENOUS | Status: DC | PRN

## 2019-12-16 MED ORDER — ONDANSETRON HCL 4 MG/2ML IJ SOLN: 4.0000 mg | Freq: Four times a day (QID) | INTRAMUSCULAR | Status: DC | PRN

## 2019-12-16 MED ORDER — HEPARIN (PORCINE) IN NACL 1000-0.9 UT/500ML-% IV SOLN
INTRAVENOUS | Status: AC
  Filled 2019-12-16: qty 1000

## 2019-12-16 MED ORDER — LABETALOL HCL 5 MG/ML IV SOLN: 10.0000 mg | INTRAVENOUS | Status: AC | PRN

## 2019-12-16 MED ORDER — HEPARIN SODIUM (PORCINE) 1000 UNIT/ML IJ SOLN
INTRAMUSCULAR | Status: DC | PRN
  Administered 2019-12-16: 4000 [IU] via INTRAVENOUS
  Administered 2019-12-16: 5000 [IU] via INTRAVENOUS

## 2019-12-16 MED ORDER — MIDAZOLAM HCL 2 MG/2ML IJ SOLN
INTRAMUSCULAR | Status: DC | PRN
  Administered 2019-12-16: 1 mg via INTRAVENOUS

## 2019-12-16 MED ORDER — LIDOCAINE HCL (PF) 1 % IJ SOLN
INTRAMUSCULAR | Status: DC | PRN
  Administered 2019-12-16 (×2): 2 mL

## 2019-12-16 MED ORDER — IOHEXOL 350 MG/ML SOLN
INTRAVENOUS | Status: AC
  Filled 2019-12-16: qty 1

## 2019-12-16 SURGICAL SUPPLY — 17 items
CATH BALLN WEDGE 5F 110CM (CATHETERS) ×2 IMPLANT
CATH INFINITI 5 FR IM (CATHETERS) ×2 IMPLANT
CATH INFINITI 5FR AL1 (CATHETERS) ×2 IMPLANT
CATH INFINITI 5FR MULTPACK ANG (CATHETERS) ×2 IMPLANT
CATH VISTA GUIDE 6FR XBLAD3.5 (CATHETERS) ×2 IMPLANT
DEVICE RAD COMP TR BAND LRG (VASCULAR PRODUCTS) ×2 IMPLANT
GLIDESHEATH SLEND SS 6F .021 (SHEATH) ×4 IMPLANT
GUIDEWIRE INQWIRE 1.5J.035X260 (WIRE) ×1 IMPLANT
GUIDEWIRE PRESSURE COMET II (WIRE) ×2 IMPLANT
INQWIRE 1.5J .035X260CM (WIRE) ×2
KIT ESSENTIALS PG (KITS) ×2 IMPLANT
KIT HEART LEFT (KITS) ×2 IMPLANT
PACK CARDIAC CATHETERIZATION (CUSTOM PROCEDURE TRAY) ×2 IMPLANT
SHEATH GLIDE SLENDER 4/5FR (SHEATH) ×2 IMPLANT
SHEATH PROBE COVER 6X72 (BAG) ×2 IMPLANT
TRANSDUCER W/STOPCOCK (MISCELLANEOUS) ×2 IMPLANT
TUBING CIL FLEX 10 FLL-RA (TUBING) ×2 IMPLANT

## 2019-12-16 NOTE — Discharge Instructions (Signed)
Radial Site Care  This sheet gives you information about how to care for yourself after your procedure. Your health care provider may also give you more specific instructions. If you have problems or questions, contact your health care provider. What can I expect after the procedure? After the procedure, it is common to have:  Bruising and tenderness at the catheter insertion area. Follow these instructions at home: Medicines  Take over-the-counter and prescription medicines only as told by your health care provider. Insertion site care  Follow instructions from your health care provider about how to take care of your insertion site. Make sure you: ? Wash your hands with soap and water before you change your bandage (dressing). If soap and water are not available, use hand sanitizer. ? Change your dressing as told by your health care provider. ? Leave stitches (sutures), skin glue, or adhesive strips in place. These skin closures may need to stay in place for 2 weeks or longer. If adhesive strip edges start to loosen and curl up, you may trim the loose edges. Do not remove adhesive strips completely unless your health care provider tells you to do that.  Check your insertion site every day for signs of infection. Check for: ? Redness, swelling, or pain. ? Fluid or blood. ? Pus or a bad smell. ? Warmth.  Do not take baths, swim, or use a hot tub until your health care provider approves.  You may shower 24-48 hours after the procedure, or as directed by your health care provider. ? Remove the dressing and gently wash the site with plain soap and water. ? Pat the area dry with a clean towel. ? Do not rub the site. That could cause bleeding.  Do not apply powder or lotion to the site. Activity   For 24 hours after the procedure, or as directed by your health care provider: ? Do not flex or bend the affected arm. ? Do not push or pull heavy objects with the affected arm. ? Do not  drive yourself home from the hospital or clinic. You may drive 24 hours after the procedure unless your health care provider tells you not to. ? Do not operate machinery or power tools.  Do not lift anything that is heavier than 10 lb (4.5 kg), or the limit that you are told, until your health care provider says that it is safe.  Ask your health care provider when it is okay to: ? Return to work or school. ? Resume usual physical activities or sports. ? Resume sexual activity. General instructions  If the catheter site starts to bleed, raise your arm and put firm pressure on the site. If the bleeding does not stop, get help right away. This is a medical emergency.  If you went home on the same day as your procedure, a responsible adult should be with you for the first 24 hours after you arrive home.  Keep all follow-up visits as told by your health care provider. This is important. Contact a health care provider if:  You have a fever.  You have redness, swelling, or yellow drainage around your insertion site. Get help right away if:  You have unusual pain at the radial site.  The catheter insertion area swells very fast.  The insertion area is bleeding, and the bleeding does not stop when you hold steady pressure on the area.  Your arm or hand becomes pale, cool, tingly, or numb. These symptoms may represent a serious problem   that is an emergency. Do not wait to see if the symptoms will go away. Get medical help right away. Call your local emergency services (911 in the [place] Do not drive yourself to the hospital. Summary  After the procedure, it is common to have bruising and tenderness at the site.  Follow instructions from your health care provider about how to take care of your radial site wound. Check the wound every day for signs of infection.  Do not lift anything that is heavier than 10 lb (4.5 kg), or the limit that you are told, until your health care provider says  that it is safe. This information is not intended to replace advice given to you by your health care provider. Make sure you discuss any questions you have with your health care provider. Document Revised: 09/23/2017 Document Reviewed: 09/23/2017 Elsevier Patient Education  2020 Elsevier Inc.  

## 2019-12-16 NOTE — Progress Notes (Signed)
Pt wife called and requested that Dr Herbie Baltimore come out to speak with her she has a lot of questions. Dr Herbie Baltimore called and informed. States he will when he can he is in a procedure. Pt wife informed. Discharge instructions reviewed with pt and his wife( via telephone) both voice understanding.

## 2019-12-16 NOTE — Interval H&P Note (Signed)
History and Physical Interval Note:  12/16/2019 2:48 PM  Eric [name]  has presented today for surgery, with the diagnosis of Chest pain / SHORTNESS OF BREATH (? ANGINA EQUIVALENT).  The various methods of treatment have been discussed with the patient and family. After consideration of risks, benefits and other options for treatment, the patient has consented to  Procedure(s): RIGHT/LEFT HEART CATH AND CORONARY/GRAFT ANGIOGRAPHY (N/A) PERCUTANEOUS CORONARY INTERVENTION    as a surgical intervention.  The patient's history has been reviewed, patient examined, no change in status, stable for surgery.  I have reviewed the patient's chart and labs.  Questions were answered to the patient's satisfaction.   Cath Lab Visit (complete for each Cath Lab visit)  Clinical Evaluation Leading to the Procedure:   ACS: No.  Non-ACS:    Anginal Classification: CCS II  Anti-ischemic medical therapy: Maximal Therapy (2 or more classes of medications)  Non-Invasive Test Results: No non-invasive testing performed  Prior CABG: Previous CABG   Bryan Lemma

## 2019-12-19 ENCOUNTER — Ambulatory Visit: Payer: Medicare Other | Admitting: Student

## 2019-12-19 ENCOUNTER — Other Ambulatory Visit: Payer: Medicare Other

## 2019-12-19 DIAGNOSIS — R079 Chest pain, unspecified: Secondary | ICD-10-CM

## 2019-12-19 DIAGNOSIS — R002 Palpitations: Secondary | ICD-10-CM

## 2019-12-19 DIAGNOSIS — R0600 Dyspnea, unspecified: Secondary | ICD-10-CM

## 2019-12-19 DIAGNOSIS — R42 Dizziness and giddiness: Secondary | ICD-10-CM

## 2019-12-19 DIAGNOSIS — R0609 Other forms of dyspnea: Secondary | ICD-10-CM

## 2019-12-20 ENCOUNTER — Other Ambulatory Visit (INDEPENDENT_AMBULATORY_CARE_PROVIDER_SITE_OTHER): Payer: Medicare Other

## 2019-12-20 DIAGNOSIS — I10 Essential (primary) hypertension: Secondary | ICD-10-CM | POA: Diagnosis not present

## 2019-12-22 ENCOUNTER — Telehealth (INDEPENDENT_AMBULATORY_CARE_PROVIDER_SITE_OTHER): Payer: Medicare Other | Admitting: Cardiovascular Disease

## 2019-12-22 ENCOUNTER — Encounter: Payer: Self-pay | Admitting: Cardiovascular Disease

## 2019-12-22 VITALS — BP 119/84 | HR 91 | Ht 66.0 in | Wt 194.2 lb

## 2019-12-22 DIAGNOSIS — I25810 Atherosclerosis of coronary artery bypass graft(s) without angina pectoris: Secondary | ICD-10-CM | POA: Diagnosis not present

## 2019-12-22 DIAGNOSIS — E782 Mixed hyperlipidemia: Secondary | ICD-10-CM | POA: Diagnosis not present

## 2019-12-22 DIAGNOSIS — G4733 Obstructive sleep apnea (adult) (pediatric): Secondary | ICD-10-CM

## 2019-12-22 DIAGNOSIS — E1165 Type 2 diabetes mellitus with hyperglycemia: Secondary | ICD-10-CM | POA: Diagnosis not present

## 2019-12-22 DIAGNOSIS — I10 Essential (primary) hypertension: Secondary | ICD-10-CM

## 2019-12-22 DIAGNOSIS — Z8673 Personal history of transient ischemic attack (TIA), and cerebral infarction without residual deficits: Secondary | ICD-10-CM

## 2019-12-22 DIAGNOSIS — J984 Other disorders of lung: Secondary | ICD-10-CM

## 2019-12-22 MED ORDER — FUROSEMIDE 40 MG PO TABS: ORAL_TABLET | ORAL | 1 refills | Status: DC

## 2019-12-22 NOTE — Progress Notes (Addendum)
Virtual Visit via Telephone Note   This visit type was conducted due to national recommendations for restrictions regarding the COVID-19 Pandemic (e.g. social distancing) in an effort to limit this patient's exposure and mitigate transmission in our community.  Due to his co-morbid illnesses, this patient is at least at moderate risk for complications without adequate follow up.  This format is felt to be most appropriate for this patient at this time.  The patient did not have access to video technology/had technical difficulties with video requiring transitioning to audio format only (telephone).  All issues noted in this document were discussed and addressed.  No physical exam could be performed with this format.  Please refer to the patient's chart for his  consent to telehealth for [facility].   The patient was identified using 2 identifiers.  Date:  12/22/2019   ID:  Eric [name], DOB 10/29/1960, MRN [id number]  Patient Location: Home Provider Location: Home  PCP:  Marlowe-Rogers, Hidi, MD  Cardiologist:  Thurmon Fair, MD  Electrophysiologist:  None   Evaluation Performed:  Follow-Up Visit  Chief Complaint:  CAD f/u after cardiac cath   Chief Complaint  Patient presents with  . Coronary Artery Disease    History of Present Illness:    Mutasim Tuckey is a 59 y.o. male with a hx of CAD s/p CABG March 2020 (complicated by brief postoperative atrial fibrillation that has not recurred), history of diastolic heart failure, type 2 diabetes mellitus (poorly controlled and complicated by polyneuropathy, severe mixed hyperlipidemia, fatty liver),  history of TIA, OSA, gastroesophageal reflux disease.  He was hospitalized at [facility] in August for heart failure that improved with diuretic therapy.  He underwent an echocardiogram that again showed normal LVEF and a Lexiscan Myoview that did not show ischemia.  Following discharge he again had fluid gain and after  diuretic dose increase he improved.  Due to recurrent complaints of fatigue, dyspnea and a persistent nonproductive cough he eventually underwent right and left heart catheterization on 12/16/2019.  Right and left heart filling pressures were very normal (mean PAWP 12, mean RA 4, PA 27/8, LVEDP 12, cardiac index 2.1).  There was no evidence of congestive heart failure.  Angiography did show early failure of the SVG-ramus intermedius, but the ramus intermedius artery itself had about a 60% stenosis that was not significant by FFR.  The LIMA to the LAD was widely patent.  There was an approximately 40% stenosis in the left circumflex and no visible stenosis in the right coronary artery.  He continues to complain of fatigue, but his biggest complaint is a persistent nonproductive hacking cough.  It occurs immediately after he lays down in bed at night and starts again when he gets up in the morning.  He coughed several times during the discussion today.  He has noticed that his oxygen saturation drops to 89% following a bout of cough, but then rebounds to 95%.  He has never had angina pectoris.  He does not have orthopnea, PND or leg edema.  Glycemic control remains extremely poor, with glucose levels in the 400-500 range consistently.  His most recent hemoglobin A1c was 14.8%.  He is no longer taking Invokamet but is on a combination of Toujeo 110 mg daily and Humalog 45 units with each meal.  He frequently skips meals if his wife is not there to prepare him for him.  The most recent lipid profile I have access to shows a triglyceride level of  977.  His cholesterol is only 209 and he has a very low HDL at 24.  Unable to calculate the LDL.  He is taking atorvastatin 80 mg daily.  His creatinine on the day of his heart cath, April 16, was 1.24.  Urinalysis did not show proteinuria.  Note that he did have pulmonary function tests before his bypass surgery in March 2020.  FVC was 2.3 L (53% of predicted) and  FEV1 was 1.83 L/minute (comments right reduction of 55% of predicted).  He has a relatively short history of smoking 1 pack a day for about 10 years (he started at age 89), but has not smoked since [address]1981.  He worked as a Psychologist, occupationalwelder for a very long time.  Past Medical History:  Diagnosis Date  . Coronary artery disease   . Diabetes mellitus without complication (HCC)   . Fatty liver   . Hyperlipidemia   . Stroke [facility](HCC)     Past Surgical History:  Procedure Laterality Date  . CHOLECYSTECTOMY    . CORONARY ARTERY BYPASS GRAFT N/A 11/10/2018   Procedure: CORONARY ARTERY BYPASS GRAFTING (CABG) x Two , using left internal mammary artery and right leg greater saphenous vein harvested endoscopically - SVG to Ramus, LIMA to LAD;  Surgeon: Delight OvensGerhardt, Edward B, MD;  [facility];  Service: Open Heart Surgery;  Laterality: N/A;  . INTRAVASCULAR PRESSURE WIRE/FFR STUDY N/A 12/16/2019   Procedure: INTRAVASCULAR PRESSURE WIRE/FFR STUDY;  Surgeon: Marykay LexHarding, David W, MD;  [facility];  Service: Cardiovascular;  Laterality: N/A;  . LEFT HEART CATH AND CORONARY ANGIOGRAPHY N/A 11/09/2018   Procedure: LEFT HEART CATH AND CORONARY ANGIOGRAPHY;  Surgeon: Lennette BihariKelly, Thomas A, MD;  Location: [facility] INVASIVE CV LAB;  Service: Cardiovascular;  Laterality: N/A;  . RIGHT/LEFT HEART CATH AND CORONARY/GRAFT ANGIOGRAPHY N/A 12/16/2019   Procedure: RIGHT/LEFT HEART CATH AND CORONARY/GRAFT ANGIOGRAPHY;  Surgeon: Marykay LexHarding, David W, MD;  [facility];  Service: Cardiovascular;  Laterality: N/A;  . SHOULDER SURGERY Left   . TEE WITHOUT CARDIOVERSION N/A 11/10/2018   Procedure: TRANSESOPHAGEAL ECHOCARDIOGRAM (TEE);  Surgeon: Delight OvensGerhardt, Edward B, MD;  [facility];  Service: Open Heart Surgery;  Laterality: N/A;    Current Medications: Current Meds  Medication Sig  . allopurinol (ZYLOPRIM) 300 MG tablet Take 300 mg by mouth daily.   Marland Kitchen. atorvastatin (LIPITOR) 80 MG tablet Take 80 mg by mouth every evening.     . Bilberry, Vaccinium myrtillus, (BILBERRY PO) Take 1 tablet by mouth daily.  . furosemide (LASIX) 40 MG tablet Take one tablet as needed for a weight gain of over 5 pounds in one week.  . gabapentin (NEURONTIN) 400 MG capsule Take 800 mg by mouth 3 (three) times daily.   Marland Kitchen. glipiZIDE (GLUCOTROL) 5 MG tablet Take 5 mg by mouth daily.  Marland Kitchen. HYDROcodone-acetaminophen (NORCO/VICODIN) 5-325 MG tablet Take 1 tablet by mouth daily as needed for moderate pain.   Marland Kitchen. insulin aspart (FIASP FLEXTOUCH) 100 UNIT/ML FlexTouch Pen Inject 30 Units into the skin daily.   . insulin glargine, 2 Unit Dial, (TOUJEO MAX) 300 UNIT/ML Solostar Pen Inject 120 Units into the skin daily.   . Insulin Pen Needle (B-D ULTRAFINE III SHORT PEN) 31G X 8 MM MISC Use to inject insulin 1 time daily  . metoprolol tartrate (LOPRESSOR) 25 MG tablet Take 0.5 tablets (12.5 mg total) by mouth 2 (two) times daily.  Marland Kitchen. omeprazole (PRILOSEC) 20 MG capsule 40 mg daily.   Marland Kitchen. spironolactone (ALDACTONE) 25  MG tablet Take 50 mg by mouth daily.  . [DISCONTINUED] furosemide (LASIX) 40 MG tablet Take 40 mg by mouth daily.   . [DISCONTINUED] ibuprofen (ADVIL) 200 MG tablet Take 400 mg by mouth every 6 (six) hours as needed for headache or moderate pain.     Allergies:   Bee venom and Lisinopril   Social History   Socioeconomic History  . Marital status: Married    Spouse name: Rudene Christians   . Number of children: 3  . Years of education: Not on file  . Highest education level: Not on file  Occupational History  . Occupation: disability   Tobacco Use  . Smoking status: Former Smoker    Packs/day: 1.00    Years: 12.00    Pack years: 12.00    Types: Cigarettes    Start date: 1968    Quit date: 1981    Years since quitting: 40.3  . Smokeless tobacco: Current User    Types: Chew  Substance and Sexual Activity  . Alcohol use: Yes    Comment: rare social couple of times a year  . Drug use: Never  . Sexual activity: Not on file  Other Topics  Concern  . Not on file  Social History Narrative   Smoked from 1st grade to 12th grade then quit   Social Determinants of Health   Financial Resource Strain:   . Difficulty of Paying Living Expenses:   Food Insecurity:   . Worried About Charity fundraiser in the Last Year:   . Arboriculturist in the Last Year:   Transportation Needs:   . Film/video editor (Medical):   Marland Kitchen Lack of Transportation (Non-Medical):   Physical Activity:   . Days of Exercise per Week:   . Minutes of Exercise per Session:   Stress:   . Feeling of Stress :   Social Connections:   . Frequency of Communication with Friends and Family:   . Frequency of Social Gatherings with Friends and Family:   . Attends Religious Services:   . Active Member of Clubs or Organizations:   . Attends Archivist Meetings:   Marland Kitchen Marital Status:   Does use snuff  Family History: The patient's family history includes Heart attack in his father; Heart failure in his father; Hyperlipidemia in his mother; Hypothyroidism in his mother. His father passed away at age 44 from congestive heart failure.  ROS:   Please see the history of present illness.    All other systems are reviewed and are negative.  EKGs/Labs/Other Studies Reviewed:    The following studies were reviewed today: Coronary angiograms and results of hemodynamic measurements at cardiac cath December 16, 2019 EKG:  EKG is not ordered today.  It shows normal sinus rhythm, normal tracing, borderline QTC 459 ms.  Recent Labs: 12/09/2019: ALT 27; BNP 13.0; Platelets 251; TSH 0.652 12/16/2019: BUN 20; Creatinine, Ser 1.24; Hemoglobin 12.2; Potassium 4.0; Sodium 139   08/01/2019 Glucose 499, hemoglobin A1c 13.6 Creatinine 2.03, BUN 46, sodium 131, potassium 4.8 Total cholesterol 209, triglycerides 9077, HDL 24  Recent Lipid Panel August 18 2018 Total cholesterol 203, triglycerides 569, HDL 26 08/01/2019 Total cholesterol 209, triglycerides 9077, HDL  24  Lipid Panel     Component Value Date/Time   CHOL 228 (H) 11/05/2018 1607   TRIG 587 (HH) 11/05/2018 1607   HDL 33 (L) 11/05/2018 1607   CHOLHDL 6.9 (H) 11/05/2018 1607   [place] Comment 11/05/2018 1607  Physical Exam:    VS:  BP 119/84   Pulse 91   Ht 5\' 6"  (1.676 m)   Wt 194 lb 3.2 oz (88.1 kg)   BMI 31.34 kg/m     Wt Readings from Last 3 Encounters:  12/22/19 194 lb 3.2 oz (88.1 kg)  12/16/19 194 lb (88 kg)  12/09/19 197 lb 9.6 oz (89.6 kg)     General: Alert, oriented x3, no distress, mildly obese Head: no evidence of trauma, PERRL, EOMI, no exophtalmos or lid lag, no myxedema, no xanthelasma; normal ears, nose and oropharynx Neck: normal jugular venous pulsations and no hepatojugular reflux; brisk carotid pulses without delay and no carotid bruits Chest: clear to auscultation, no signs of consolidation by percussion or palpation, normal fremitus, symmetrical and full respiratory excursions Cardiovascular: normal position and quality of the apical impulse, regular rhythm, normal first and second heart sounds, no murmurs, rubs or gallops Abdomen: no tenderness , prominent adiposity , no masses by palpation, no abnormal pulsatility or arterial bruits, normal bowel sounds, no hepatosplenomegaly Extremities: no clubbing, cyanosis or edema; 2+ radial, ulnar and brachial pulses bilaterally; 2+ right femoral, posterior tibial and dorsalis pedis pulses; 2+ left femoral, posterior tibial and dorsalis pedis pulses; no subclavian or femoral bruits Neurological: grossly nonfocal Psych: Normal mood and affect   ASSESSMENT:    1. Coronary artery disease involving autologous vein coronary bypass graft without angina pectoris   2. Uncontrolled type 2 diabetes mellitus with hyperglycemia (HCC)   3. Mixed hyperlipidemia   4. Essential hypertension   5. OSA (obstructive sleep apnea)   6. History of transient ischemic attack (TIA)   7. Restrictive lung disease    PLAN:    In  order of problems listed above:  1. CAD: s/p CABG March 2020 (LIMA to LAD and SVG to ramus intermedius).  Although he has lost the SVG to ramus intermedius, the native artery is still patent.  He does not have complaints of angina pectoris.  The focus is on risk factor modification.. 2. CHF: He did have heart failure exacerbation in August 2020, responded to diuretics.  Current cardiac catheterization shows optimal volume status even though he was complaining of shortness of breath.  I believe he is actually at risk for hypovolemia since his glucose levels are so high.  I advised that he stop taking furosemide on a daily basis, but continue to weigh himself daily.  If he gains more than 5 pounds from current weight he should take furosemide and let 02[birth year]28[birth year]1992 know (call us if he reaches 200 pounds). 3. DM: Very poor control.  He was on Invokana metformin for a while, but is no longer receiving this.  His wife reports that she make sure he gets his insulin and believes that for the most part he is following the recommended diet.  I have recommended that they get in touch with his diabetes specialist sooner than his scheduled appointment which is several months from now. 4. HLP: His hypertriglyceridemia is severe, puts him at risk for acute pancreatitis.  The best way to improve this is with better glycemic control.  Continue statin. 5. HTN: Well-controlled.  He was intolerant of ACE inhibitors due to cough.  He is no longer taking these and still has a cough. 6. OSA: I am not sure how long as he is going about his compliance with CPAP. 7. History of TIA: On aspirin.  No recent neurological complaints. 8. Restrictive lung disease: FVC roughly 55% of predicted range  on prebypass surgery pulmonary function testing.  He has a chronic cough.  This is not productive.  He is being scheduled for follow-up with a pulmonary specialist in [place].  I spoke with his wife Luanne Bras and told her to contact me if there is a  delay in providing pulmonary follow-up.  We will try to see if we can get this done faster in [place].  Cardiac catheterization shows normal filling pressures, confirming that his dyspnea is not related to heart disease.   Medication Adjustments/Labs and Tests Ordered: Current medicines are reviewed at length with the patient today.  Concerns regarding medicines are outlined above.  No orders of the defined types were placed in this encounter.  Meds ordered this encounter  Medications  . furosemide (LASIX) 40 MG tablet    Sig: Take one tablet as needed for a weight gain of over 5 pounds in one week.    Dispense:  30 tablet    Refill:  1    Note dose change. Do not refill. Patient will call when he needs a refill    Patient Instructions  Medication Instructions:  CHANGE: how you take the Furosemide 40 mg  to one tablet as needed for a weight gain over 5 pounds in one week.  *If you need a refill on your cardiac medications before your next appointment, please call your pharmacy*   Lab Work: None ordered If you have labs (blood work) drawn today and your tests are completely normal, you will receive your results only by: Marland Kitchen MyChart Message (if you have MyChart) OR . A paper copy in the mail If you have any lab test that is abnormal or we need to change your treatment, we will call you to review the results.   Testing/Procedures: None ordered   Follow-Up: At [facility], you and your health needs are our priority.  As part of our continuing mission to provide you with exceptional heart care, we have created designated Provider Care Teams.  These Care Teams include your primary Cardiologist (physician) and Advanced Practice Providers (APPs -  Physician Assistants and Nurse Practitioners) who all work together to provide you with the care you need, when you need it.  We recommend signing up for the patient portal called "MyChart".  Sign up information is provided on this After  Visit Summary.  MyChart is used to connect with patients for Virtual Visits (Telemedicine).  Patients are able to view lab/test results, encounter notes, upcoming appointments, etc.  Non-urgent messages can be sent to your provider as well.   To learn more about what you can do with MyChart, go to [email or web address].    Your next appointment:   6 month(s)  The format for your next appointment:   In Person  Provider:   You may see Thurmon Fair, MD or one of the following Advanced Practice Providers on your designated Care Team:    Azalee Course, PA-C  Micah Flesher, [place] or   Judy Pimple, [place]     Time spent with patient during this encounter: 24 minutes.  Signed, Thurmon Fair, MD  12/22/2019 3:17 PM    [facility]

## 2019-12-22 NOTE — Patient Instructions (Addendum)
Medication Instructions:  CHANGE: how you take the Furosemide 40 mg  to one tablet as needed for a weight gain over 5 pounds in one week.  *If you need a refill on your cardiac medications before your next appointment, please call your pharmacy*   Lab Work: None ordered If you have labs (blood work) drawn today and your tests are completely normal, you will receive your results only by: Marland Kitchen MyChart Message (if you have MyChart) OR . A paper copy in the mail If you have any lab test that is abnormal or we need to change your treatment, we will call you to review the results.   Testing/Procedures: None ordered   Follow-Up: At [facility], you and your health needs are our priority.  As part of our continuing mission to provide you with exceptional heart care, we have created designated Provider Care Teams.  These Care Teams include your primary Cardiologist (physician) and Advanced Practice Providers (APPs -  Physician Assistants and Nurse Practitioners) who all work together to provide you with the care you need, when you need it.  We recommend signing up for the patient portal called "MyChart".  Sign up information is provided on this After Visit Summary.  MyChart is used to connect with patients for Virtual Visits (Telemedicine).  Patients are able to view lab/test results, encounter notes, upcoming appointments, etc.  Non-urgent messages can be sent to your provider as well.   To learn more about what you can do with MyChart, go to [email or web address].    Your next appointment:   6 month(s)  The format for your next appointment:   In Person  Provider:   You may see Thurmon Fair, MD or one of the following Advanced Practice Providers on your designated Care Team:    Azalee Course, PA-C  Micah Flesher, PA-C or   Judy Pimple, [place]

## 2020-01-12 ENCOUNTER — Encounter: Payer: Medicare Other | Attending: Student | Admitting: Dietician

## 2020-01-12 ENCOUNTER — Ambulatory Visit: Payer: Medicare Other | Admitting: Dietician

## 2020-01-12 ENCOUNTER — Encounter: Payer: Self-pay | Admitting: Dietician

## 2020-01-12 DIAGNOSIS — IMO0002 Reserved for concepts with insufficient information to code with codable children: Secondary | ICD-10-CM

## 2020-01-12 DIAGNOSIS — E1149 Type 2 diabetes mellitus with other diabetic neurological complication: Secondary | ICD-10-CM | POA: Diagnosis present

## 2020-01-12 DIAGNOSIS — E1165 Type 2 diabetes mellitus with hyperglycemia: Secondary | ICD-10-CM | POA: Diagnosis present

## 2020-01-12 NOTE — Progress Notes (Signed)
Diabetes Self-Management Education  MyChart Visit  Visit Type: First/Initial  Appt. Start Time: 4:05pm  Appt. End Time: 5:00pm  01/12/2020  [name], identified by name and date of birth, is a 59 y.o. male with a diagnosis of Diabetes: Type 2.   ASSESSMENT  Patient's most recent A1c was 14.8% as of January 2021. States that, when he checks his blood sugar, his meter just reads "high." States he does not like taking medication and does not want to. Per diet history provided by patient, only eats once per day at supper. States everything he eats "turns to sugar." May eat out throughout the week such as [employer], [employer], or [employer] and may have sirloin with sweet potato or ribs with corn on the cob. States he sleeps about 45 minutes out of each hour at night because he has to get up to urinate throughout the night. Does not get any regular physical activity throughout the week.   Diabetes Self-Management Education - 01/12/20 1613      Visit Information   Visit Type  First/Initial      Initial Visit   Diabetes Type  Type 2    Are you currently following a meal plan?  No    Are you taking your medications as prescribed?  Yes    Date Diagnosed  2008      Psychosocial Assessment   Patient Belief/Attitude about Diabetes  Defeat/Burnout    Self-management support  Doctor's office    Other persons present  Patient   patient only via MyChart   Patient Concerns  Glycemic Control;Nutrition/Meal planning    Special Needs  None    Preferred Learning Style  No preference indicated    Learning Readiness  Contemplating      Complications   Last HgB A1C per patient/outside source  14.8 %   January 2021   How often do you check your blood sugar?  1-2 times/day    Fasting Blood glucose range (mg/dL)  >433    Postprandial Blood glucose range (mg/dL)  >295    Have you had a dilated eye exam in the past 12 months?  Yes    Have you had a dental exam in the past 12 months?  No   hasn't  been to the dentist since 1981   Are you checking your feet?  Yes   states he can't see them very well but that they "feel like fire" due to neuropathy     Dietary Intake   Breakfast  -    Snack (morning)  -    Lunch  -    Snack (afternoon)  -    Dinner  salmon + air fried cauliflower + sweet potato chips   or air fried BBQ chicken wings + steamed broccoli; or deer meat   Snack (evening)  popcorn    Beverage(s)  sugar-free flavored water      Exercise   Exercise Type  ADL's      Patient Education   Previous Diabetes Education  Yes (please comment)    Disease state   Explored patient's options for treatment of their diabetes    Nutrition management   Role of diet in the treatment of diabetes and the relationship between the three main macronutrients and blood glucose level;Reviewed blood glucose goals for pre and post meals and how to evaluate the patients' food intake on their blood glucose level.    Physical activity and exercise   Role of exercise  on diabetes management, blood pressure control and cardiac health.      Individualized Goals (developed by patient)   Nutrition  Follow meal plan discussed   Eat 3 times per day (instead of once); include protein at all meals/snacks   Medications  take my medication as prescribed      Outcomes   Expected Outcomes  Demonstrated limited interest in learning.  Expect minimal changes    Future DMSE  PRN       Individualized Plan for Diabetes Self-Management Training:   Learning Objective:  Patient will have a greater understanding of diabetes self-management. Patient education plan is to attend individual and/or group sessions per assessed needs and concerns.   Plan:  Try to eat at least 3 times per day to spread your food into smaller meals/snacks throughout the day.   Include a source of protein at each meal and snack. Use the handouts provided for ideas.   Expected Outcomes:  Demonstrated limited interest in learning.  Expect  minimal changes  Education material provided: My Plate, Meal Ideas, Balanced Snacks  If problems or questions, patient to contact team via:  Phone and Email  Future DSME appointment: PRN

## 2020-01-12 NOTE — Patient Instructions (Signed)
Try to eat at least 3 times per day to spread your food into smaller meals/snacks throughout the day.   Include a source of protein at each meal and snack. Use the handouts provided for ideas.

## 2020-01-13 ENCOUNTER — Other Ambulatory Visit: Payer: Self-pay | Admitting: Student

## 2020-01-13 DIAGNOSIS — R079 Chest pain, unspecified: Secondary | ICD-10-CM

## 2020-01-13 DIAGNOSIS — R002 Palpitations: Secondary | ICD-10-CM

## 2020-01-13 DIAGNOSIS — R0600 Dyspnea, unspecified: Secondary | ICD-10-CM

## 2020-01-13 DIAGNOSIS — R0609 Other forms of dyspnea: Secondary | ICD-10-CM

## 2020-01-13 DIAGNOSIS — R42 Dizziness and giddiness: Secondary | ICD-10-CM

## 2020-02-21 ENCOUNTER — Other Ambulatory Visit: Payer: Self-pay | Admitting: Adult Health

## 2020-03-26 IMAGING — DX PORTABLE CHEST - 1 VIEW
1 series · 1 of 1 positions shown · non-contrast
Comparison: 11/09/2018

CLINICAL DATA: Status post CABG

EXAM:
PORTABLE CHEST 1 VIEW

[chest]
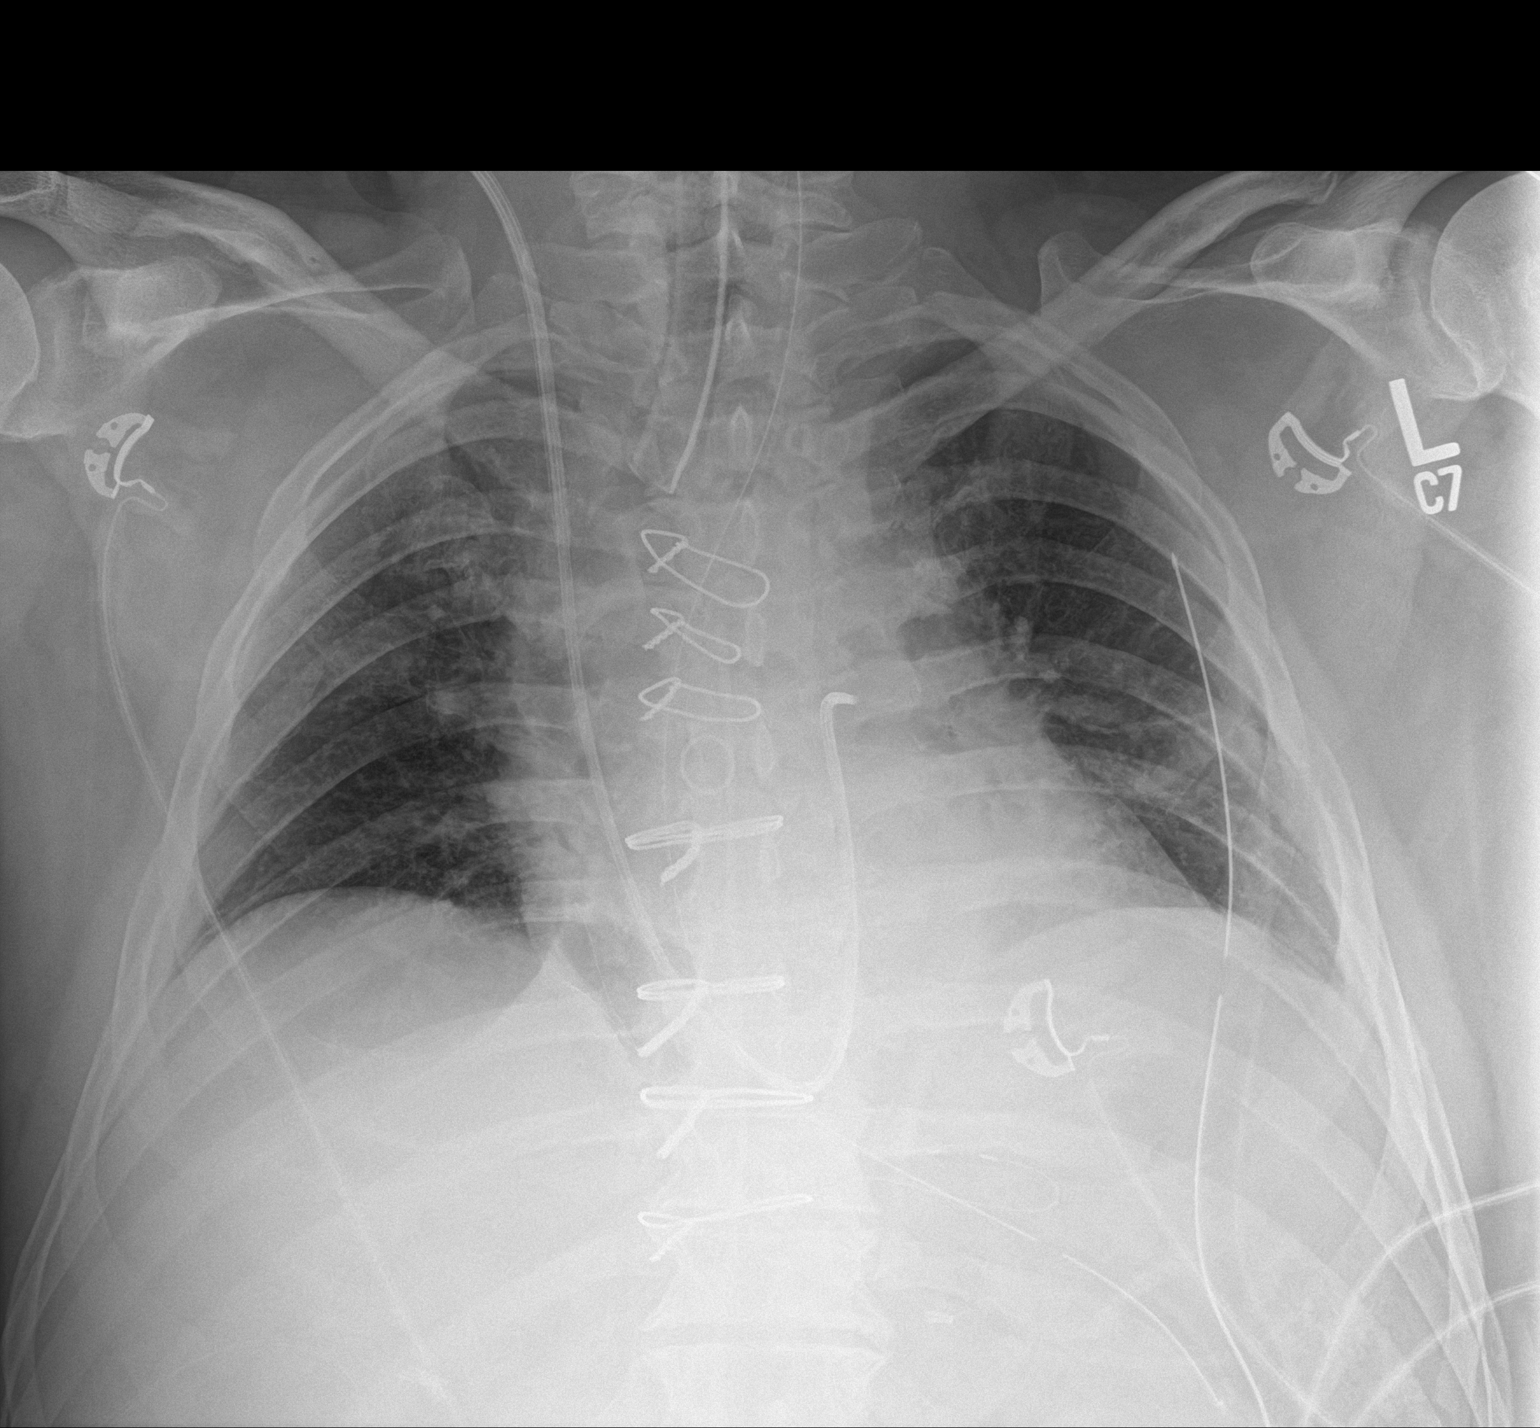

[1 of 1 positions shown; findings below may reference images not displayed]

FINDINGS: Endotracheal tube terminates 3 cm above the carina.

Right IJ Swan-Ganz catheter terminates in the left main pulmonary
artery.

Left chest tube and mediastinal drain.  No pneumothorax is seen.

Enteric tube terminates in the proximal gastric body.

Lungs are essentially clear.  No pleural effusion or pneumothorax.

Heart is normal in size. Postsurgical changes related to prior CABG.
IMPRESSION: Endotracheal tube terminates 3 cm above the carina.

Left chest tube and mediastinal drain.  No pneumothorax is seen.

Additional support apparatus as above.

Postsurgical changes related to prior CABG.

## 2020-03-27 IMAGING — DX PORTABLE CHEST - 1 VIEW
1 series · 1 of 1 positions shown · non-contrast
Comparison: 11/10/2018

CLINICAL DATA: Chest 2, status post CABG

EXAM:
PORTABLE CHEST 1 VIEW

[chest]
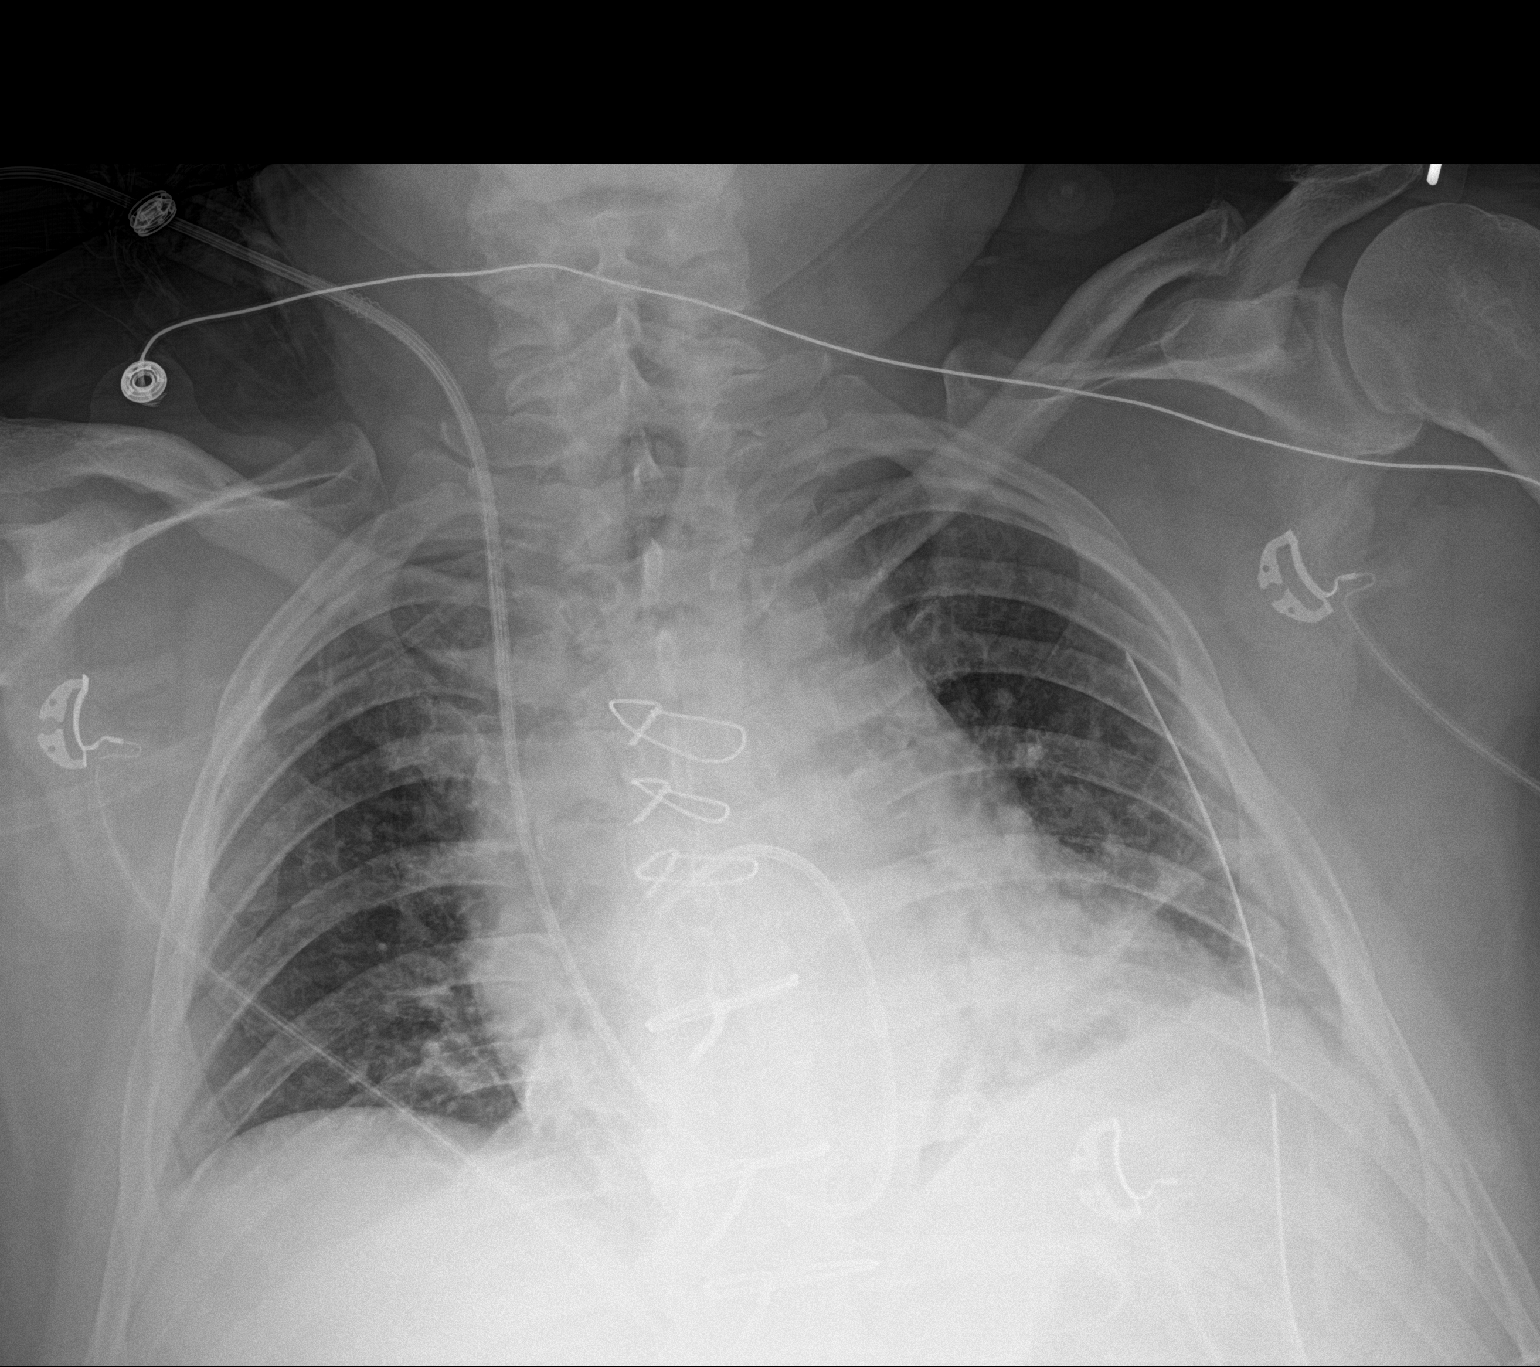

[1 of 1 positions shown; findings below may reference images not displayed]

FINDINGS: Interval removal of endotracheal and esophagogastric tubes.
Left-sided chest tube remains without significant pneumothorax
appreciated. Right neck pulmonary vascular catheter has been
repositioned, tip directed over the right pulmonary artery. Minimal
diffuse interstitial pulmonary opacity and pulmonary vascular
prominence. No focal airspace opacity. Cardiomegaly status post
median sternotomy.
IMPRESSION: Interval removal of endotracheal and esophagogastric tubes.
Left-sided chest tube remains without significant pneumothorax
appreciated. Right neck pulmonary vascular catheter has been
repositioned, tip directed over the right pulmonary artery. Minimal
diffuse interstitial pulmonary opacity and pulmonary vascular
prominence. No focal airspace opacity. Cardiomegaly status post
median sternotomy.

## 2020-03-28 IMAGING — DX PORTABLE CHEST - 1 VIEW
1 series · 1 of 1 positions shown · non-contrast
Comparison: 11/11/2018

CLINICAL DATA: Coronary bypass

EXAM:
PORTABLE CHEST 1 VIEW

[chest ap]
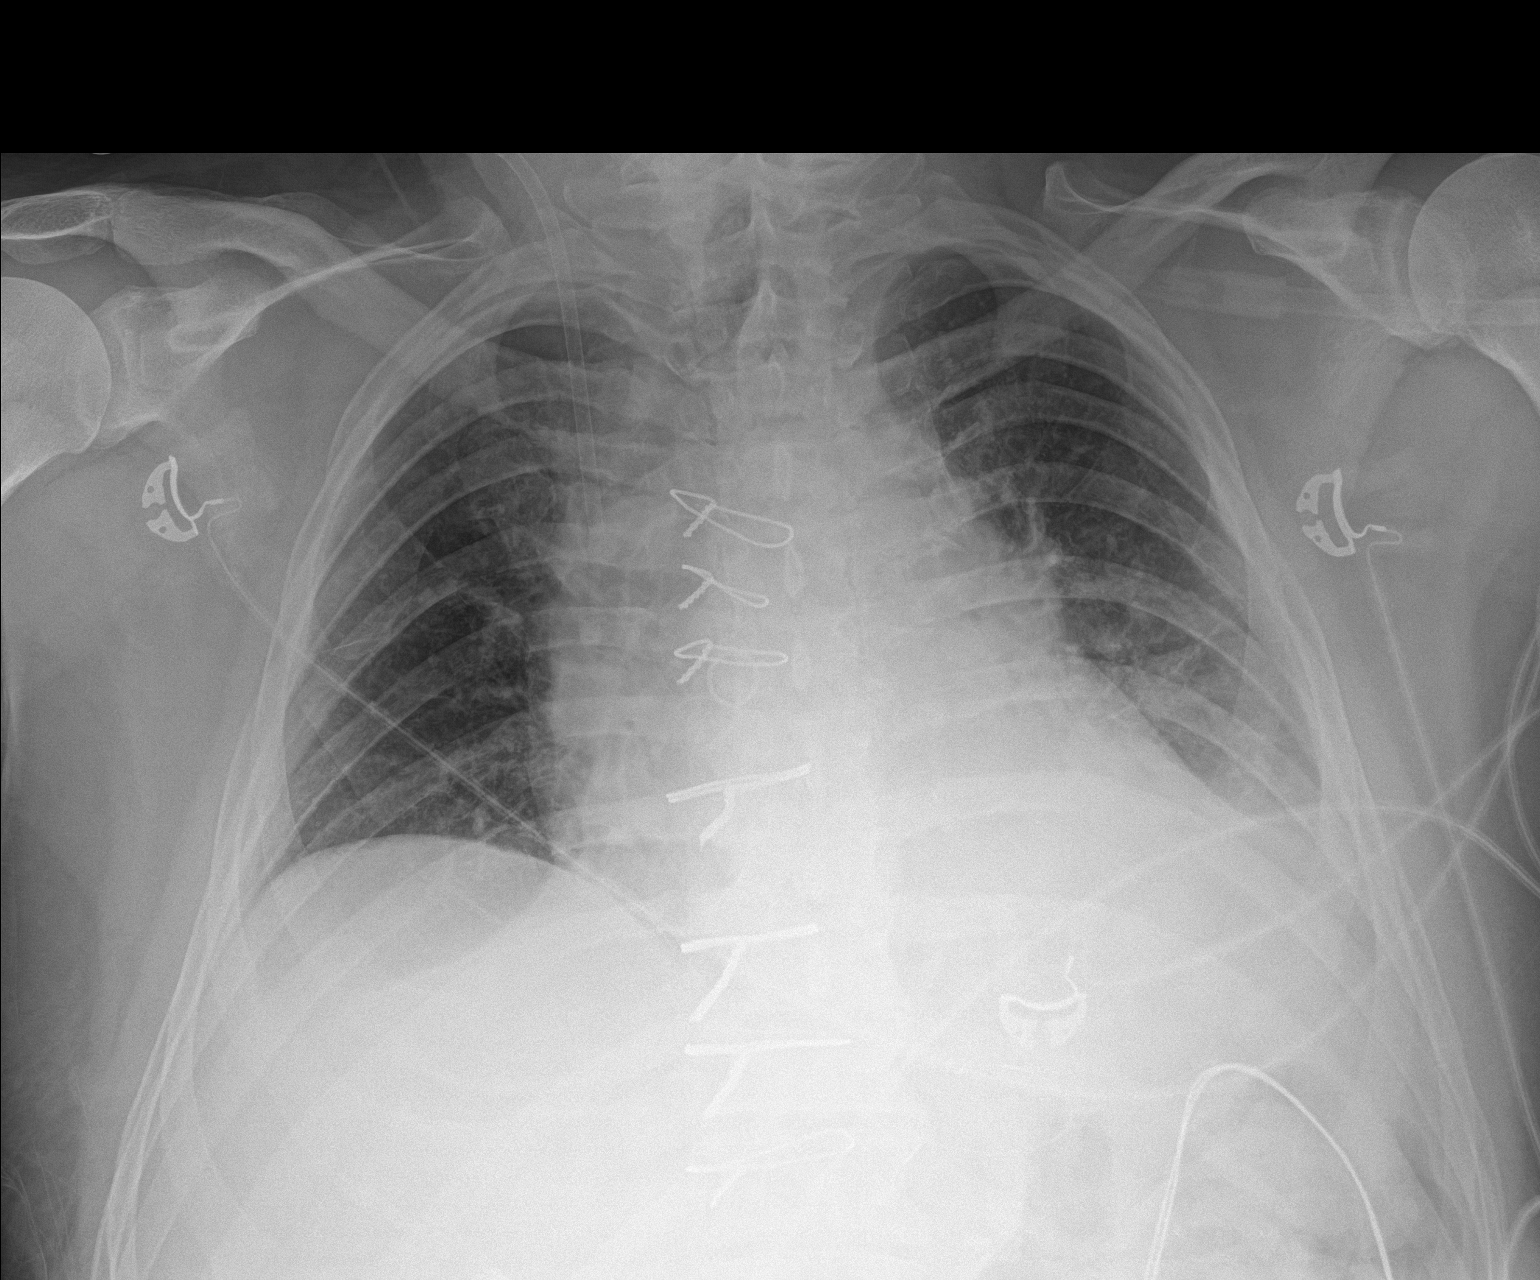

[1 of 1 positions shown; findings below may reference images not displayed]

FINDINGS: Swan-Ganz catheter and left chest tube have been removed. Right IJ
vascular sheath remains at the innominate venous confluence.
Previous coronary bypass changes noted. Stable cardiomegaly with
central vascular congestion, low lung volumes. Increased left
basilar atelectasis/consolidation. Small left effusion not excluded.
No pneumothorax. Trachea is midline.
IMPRESSION: Persistent cardiomegaly with vascular congestion and low lung
volumes

Increased left lower lobe atelectasis/consolidation

No pneumothorax

## 2020-04-01 IMAGING — DX CHEST - 2 VIEW
2 series · 2 of 2 positions shown · non-contrast
Comparison: 11/15/2018.

CLINICAL DATA: Sore chest.

EXAM:
CHEST - 2 VIEW

[w chest pa]
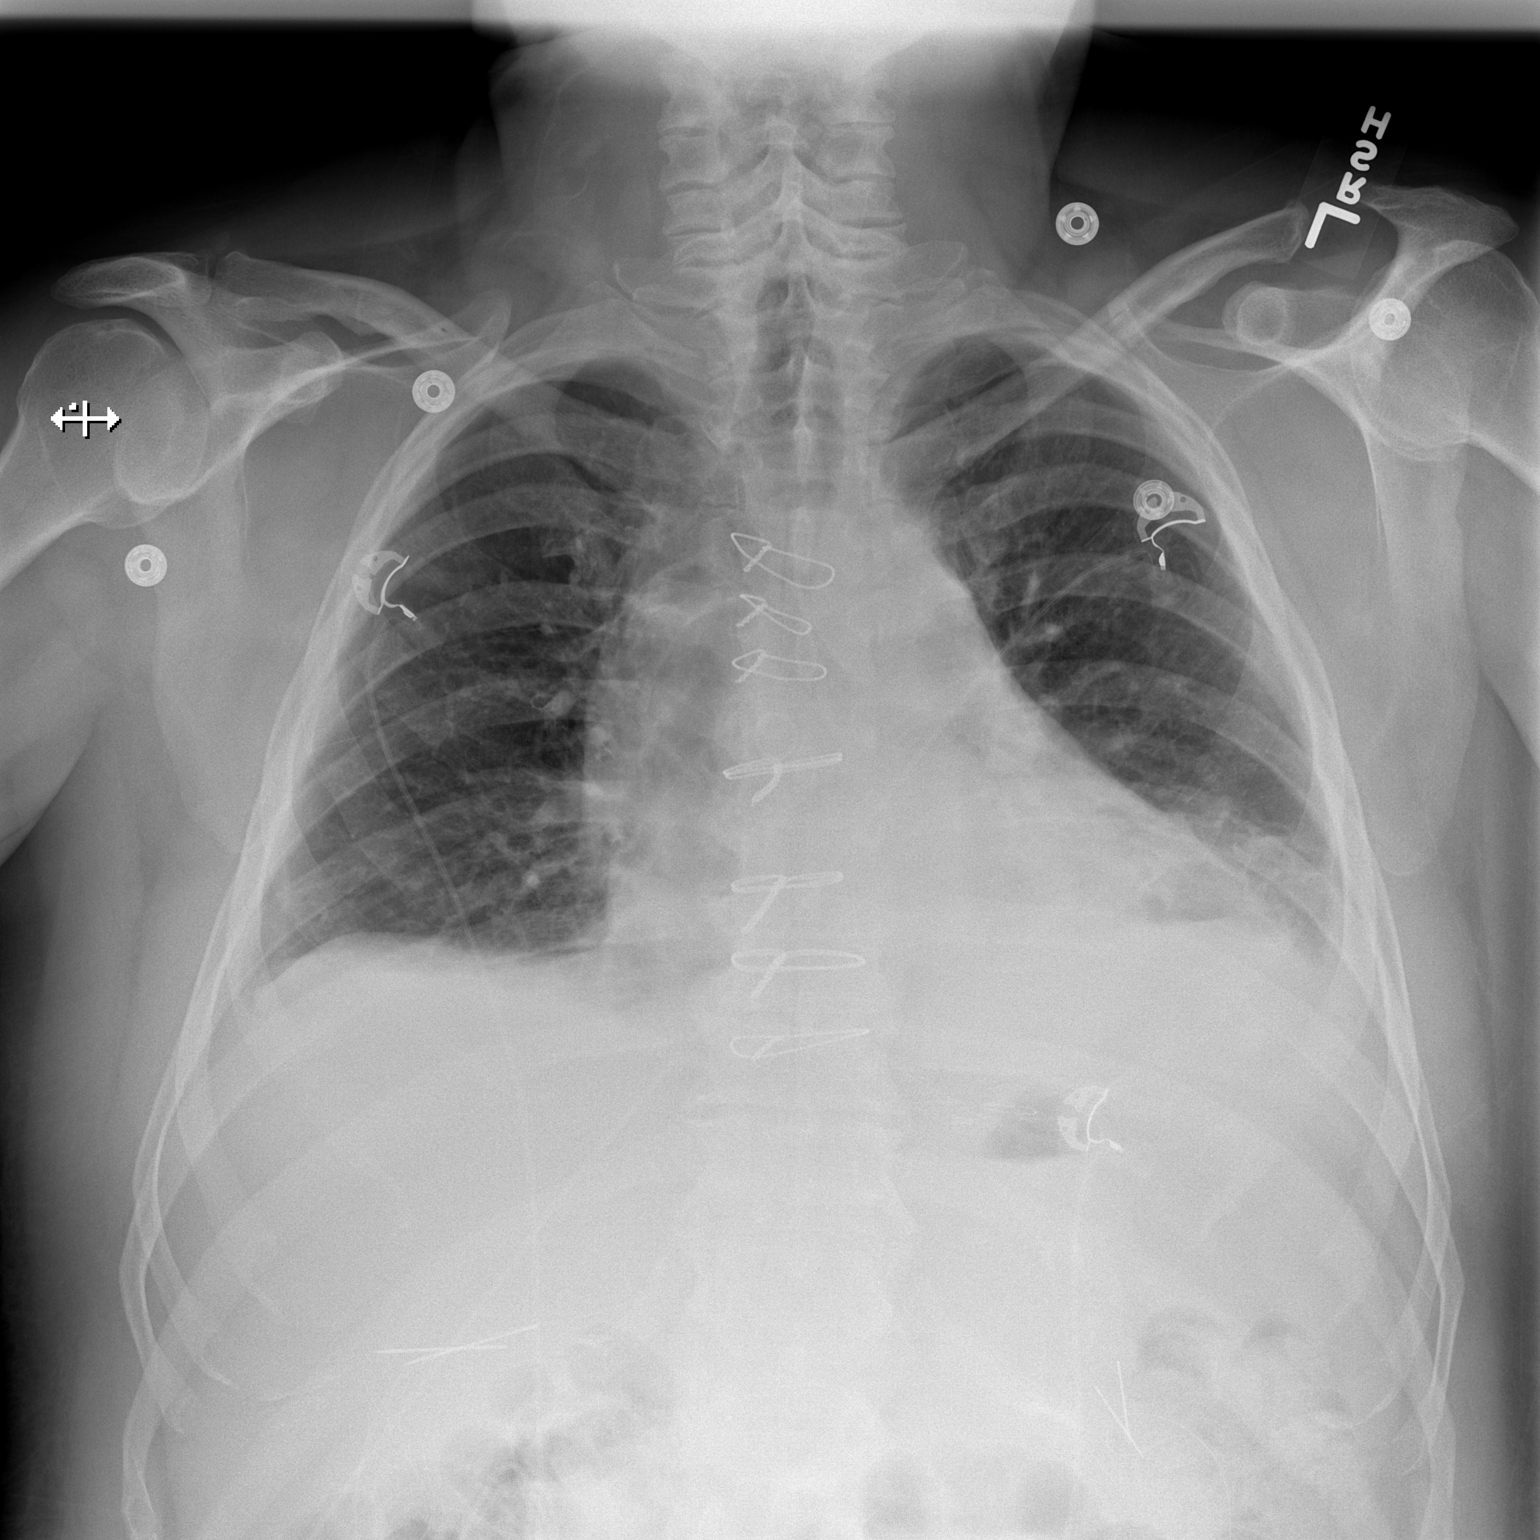

[w chest lat]
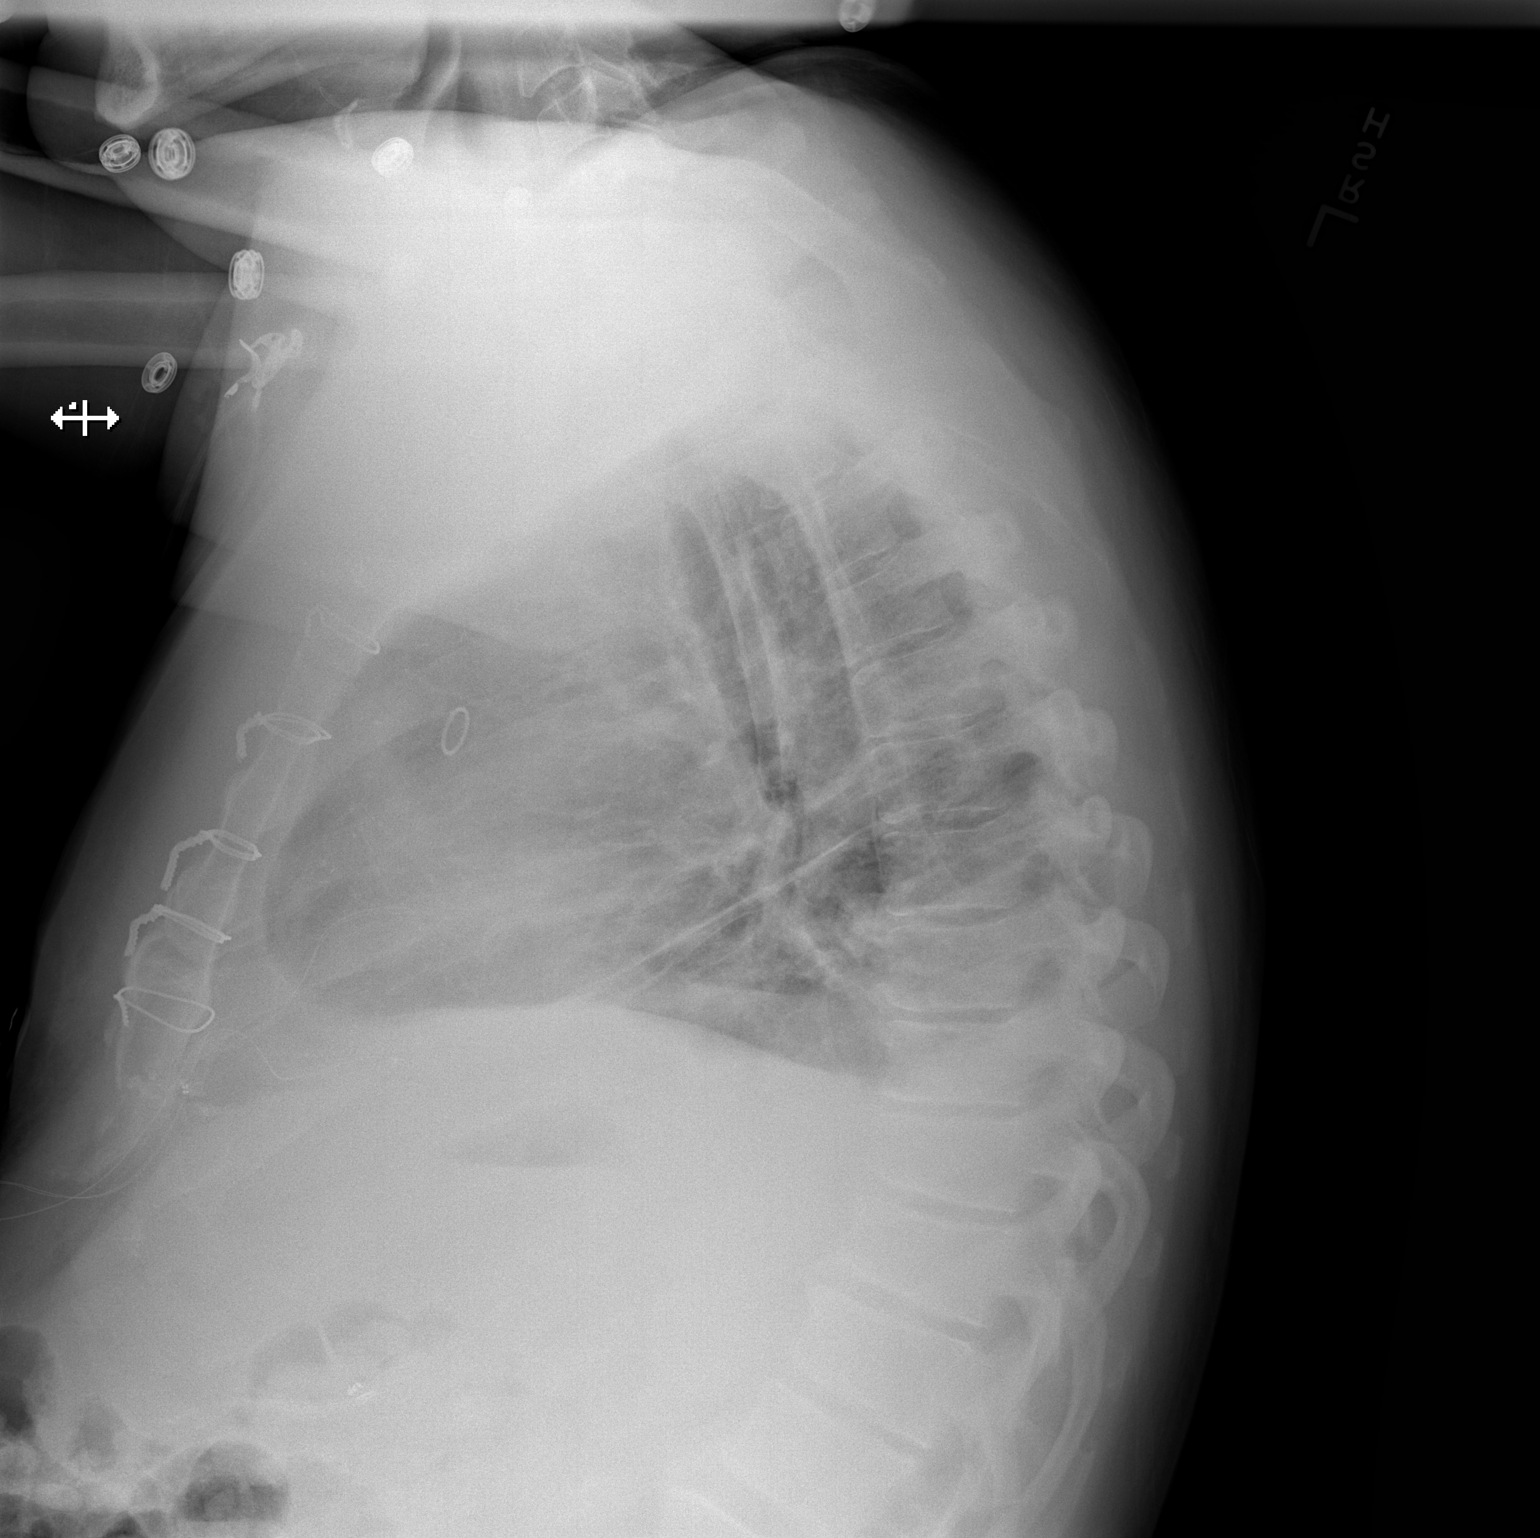

[2 of 2 positions shown; findings below may reference images not displayed]

FINDINGS: Right IJ sheath has been removed. Prior median sternotomy and CABG.
Cardiomegaly with normal pulmonary vascularity. Mild bibasilar
atelectasis/infiltrates. Small bilateral pleural effusions.
IMPRESSION: 1.  Right IJ sheath has been removed.

2.  Prior CABG.  Cardiomegaly with normal pulmonary vascularity.

3. Mild bibasilar atelectasis/infiltrates. Small bilateral pleural
effusions. Findings stable from prior exam.

## 2020-04-14 ENCOUNTER — Other Ambulatory Visit: Payer: Self-pay | Admitting: Adult Health

## 2020-06-11 ENCOUNTER — Telehealth: Payer: Self-pay | Admitting: *Deleted

## 2020-06-11 MED ORDER — FUROSEMIDE 40 MG PO TABS: 40.0000 mg | ORAL_TABLET | Freq: Every day | ORAL | 1 refills | Status: DC

## 2020-06-11 NOTE — Telephone Encounter (Signed)
Called the patient's wife concerning the patient's advice request.   hi Joandry salt level has dropped to 121 should i back off on the 2  furosemide pills  and 2 spironolactone pills once a day  should I back off for awhile or what should do. You can call me at [phone] after 4 pm.  Thank you  Harley Alto   Per Dr. Royann Shivers, the patient should stop the Spironolactone and then decrease the Furosemide to 40 mg once daily.   She has also been made aware the per Dr. Royann Shivers:  Actually, I saw that the K is high. Please stop spironolactone altogether. Needs ED or Urgent Care visit.

## 2020-12-11 ENCOUNTER — Encounter (HOSPITAL_COMMUNITY): Payer: Self-pay

## 2020-12-11 ENCOUNTER — Telehealth: Payer: Self-pay | Admitting: Cardiovascular Disease

## 2020-12-11 ENCOUNTER — Other Ambulatory Visit: Payer: Self-pay

## 2020-12-11 ENCOUNTER — Emergency Department (HOSPITAL_COMMUNITY): Payer: Medicare Other

## 2020-12-11 ENCOUNTER — Inpatient Hospital Stay (HOSPITAL_COMMUNITY)
Admission: EM | Admit: 2020-12-11 | Discharge: 2020-12-14 | DRG: 638 | Disposition: A | Discharge status: Skilled Nursing Facility | Payer: Medicare Other | Attending: Internal Medicine | Admitting: Internal Medicine

## 2020-12-11 DIAGNOSIS — I2510 Atherosclerotic heart disease of native coronary artery without angina pectoris: Secondary | ICD-10-CM | POA: Diagnosis present

## 2020-12-11 DIAGNOSIS — Z79899 Other long term (current) drug therapy: Secondary | ICD-10-CM

## 2020-12-11 DIAGNOSIS — N179 Acute kidney failure, unspecified: Secondary | ICD-10-CM

## 2020-12-11 DIAGNOSIS — K760 Fatty (change of) liver, not elsewhere classified: Secondary | ICD-10-CM | POA: Diagnosis present

## 2020-12-11 DIAGNOSIS — E782 Mixed hyperlipidemia: Secondary | ICD-10-CM | POA: Diagnosis present

## 2020-12-11 DIAGNOSIS — Z951 Presence of aortocoronary bypass graft: Secondary | ICD-10-CM

## 2020-12-11 DIAGNOSIS — IMO0002 Reserved for concepts with insufficient information to code with codable children: Secondary | ICD-10-CM | POA: Diagnosis present

## 2020-12-11 DIAGNOSIS — E1149 Type 2 diabetes mellitus with other diabetic neurological complication: Secondary | ICD-10-CM | POA: Diagnosis present

## 2020-12-11 DIAGNOSIS — Z7984 Long term (current) use of oral hypoglycemic drugs: Secondary | ICD-10-CM

## 2020-12-11 DIAGNOSIS — I130 Hypertensive heart and chronic kidney disease with heart failure and stage 1 through stage 4 chronic kidney disease, or unspecified chronic kidney disease: Secondary | ICD-10-CM | POA: Diagnosis present

## 2020-12-11 DIAGNOSIS — E861 Hypovolemia: Secondary | ICD-10-CM | POA: Diagnosis present

## 2020-12-11 DIAGNOSIS — R739 Hyperglycemia, unspecified: Secondary | ICD-10-CM

## 2020-12-11 DIAGNOSIS — R531 Weakness: Secondary | ICD-10-CM | POA: Diagnosis not present

## 2020-12-11 DIAGNOSIS — I5032 Chronic diastolic (congestive) heart failure: Secondary | ICD-10-CM | POA: Diagnosis present

## 2020-12-11 DIAGNOSIS — E1122 Type 2 diabetes mellitus with diabetic chronic kidney disease: Secondary | ICD-10-CM | POA: Diagnosis present

## 2020-12-11 DIAGNOSIS — E1165 Type 2 diabetes mellitus with hyperglycemia: Secondary | ICD-10-CM | POA: Diagnosis not present

## 2020-12-11 DIAGNOSIS — I951 Orthostatic hypotension: Secondary | ICD-10-CM | POA: Diagnosis present

## 2020-12-11 DIAGNOSIS — I10 Essential (primary) hypertension: Secondary | ICD-10-CM | POA: Diagnosis present

## 2020-12-11 DIAGNOSIS — Z8673 Personal history of transient ischemic attack (TIA), and cerebral infarction without residual deficits: Secondary | ICD-10-CM

## 2020-12-11 DIAGNOSIS — Z794 Long term (current) use of insulin: Secondary | ICD-10-CM

## 2020-12-11 DIAGNOSIS — E1142 Type 2 diabetes mellitus with diabetic polyneuropathy: Secondary | ICD-10-CM | POA: Diagnosis present

## 2020-12-11 DIAGNOSIS — F17220 Nicotine dependence, chewing tobacco, uncomplicated: Secondary | ICD-10-CM | POA: Diagnosis present

## 2020-12-11 DIAGNOSIS — N1831 Chronic kidney disease, stage 3a: Secondary | ICD-10-CM | POA: Diagnosis present

## 2020-12-11 DIAGNOSIS — K219 Gastro-esophageal reflux disease without esophagitis: Secondary | ICD-10-CM | POA: Diagnosis present

## 2020-12-11 DIAGNOSIS — R296 Repeated falls: Secondary | ICD-10-CM | POA: Diagnosis present

## 2020-12-11 DIAGNOSIS — Z8249 Family history of ischemic heart disease and other diseases of the circulatory system: Secondary | ICD-10-CM

## 2020-12-11 DIAGNOSIS — Z20822 Contact with and (suspected) exposure to covid-19: Secondary | ICD-10-CM | POA: Diagnosis present

## 2020-12-11 DIAGNOSIS — R54 Age-related physical debility: Secondary | ICD-10-CM | POA: Diagnosis present

## 2020-12-11 LAB — BASIC METABOLIC PANEL
Anion gap: 14 (ref 5–15)
BUN: 57 mg/dL — ABNORMAL HIGH (ref 6–20)
CO2: 23 mmol/L (ref 22–32)
Calcium: 9.3 mg/dL (ref 8.9–10.3)
Chloride: 86 mmol/L — ABNORMAL LOW (ref 98–111)
Creatinine, Ser: 2.41 mg/dL — ABNORMAL HIGH (ref 0.61–1.24)
GFR, Estimated: 30 mL/min — ABNORMAL LOW (ref >60)
Glucose, Bld: 643 mg/dL (ref 70–99)
Potassium: 5.3 mmol/L — ABNORMAL HIGH (ref 3.5–5.1)
Sodium: 123 mmol/L — ABNORMAL LOW (ref 135–145)

## 2020-12-11 LAB — CBC
HCT: 44.4 % (ref 39.0–52.0)
Hemoglobin: 15.7 g/dL (ref 13.0–17.0)
MCH: 30.2 pg (ref 26.0–34.0)
MCHC: 35.4 g/dL (ref 30.0–36.0)
MCV: 85.4 fL (ref 80.0–100.0)
Platelets: 196 10*3/uL (ref 150–400)
RBC: 5.2 MIL/uL (ref 4.22–5.81)
RDW: 14.1 % (ref 11.5–15.5)
WBC: 9.1 10*3/uL (ref 4.0–10.5)
nRBC: 0 % (ref 0.0–0.2)

## 2020-12-11 LAB — TROPONIN I (HIGH SENSITIVITY): Troponin I (High Sensitivity): 5 ng/L (ref <18)

## 2020-12-11 NOTE — Telephone Encounter (Signed)
Returned call to patient who states that recently he has been falling more frequently. Patient states that his legs feel like jello after walking 50-60 feet and he collapses to the floor. Patient states he last fell at 11am today and states he hit his head on the concrete. Patient denies any injuries from fall.  Patient states that his recent BP and HR have been normal but unable to provide readings. Patient states that has also been experiencing shortness of breath with exertion and chest pain. Patient states that the last episode of chest pain was last night and that it felt like a sharp stabbing pain in the center of his chest that lasted for 30 minutes. Patient states that his legs currently hurt very bad and he feels like he is wearing compression stockings at all times but isn't. Patient reports sores on his ankles as well. Patient states that he has been taking medication as prescribed except for his insulin. Patient states that he stopped his insulin due to the pain from the needles, and states that it was not "helping his blood sugar anyways". Patient states that he has also recently lost quite a bit of weight and now weighs 177 lbs.   Spoke with Dr. Royann Shivers regarding patient who recommends patient report to the emergency room for evaluation.   Made patient and patients wife aware of recommendations, and they both verbalized understanding. Patients wife states that they live closer to [facility] and [place]- and states that she will take him to the ED to be evaluated.

## 2020-12-11 NOTE — Telephone Encounter (Signed)
   STAT if patient feels like he/she is going to faint   1) Are you dizzy now? Not sure  2) Do you feel faint or have you passed out?   3) Do you have any other symptoms?   4) Have you checked your HR and BP (record if available)?   Pt's wife calling, she said if Dr. Salena Saner or his nurse can give pt's call, she said pt been falling a lot, she's not sure if he is dizzy but she did confirm he did not passed out, she said pt's balance is off and falls a lot. She couldn't answer the rest of the question, she said she was not with pt when it happened, she said to call pt on his cell#

## 2020-12-11 NOTE — ED Triage Notes (Addendum)
Pt has had increasing leg weakness for past few months. Pt has had multiple falls at home, denies taking a blood thinner. Pt c/o pain in legs. Pt c/o chest pain that started last night.

## 2020-12-12 ENCOUNTER — Encounter (HOSPITAL_COMMUNITY): Payer: Self-pay | Admitting: Internal Medicine

## 2020-12-12 ENCOUNTER — Other Ambulatory Visit: Payer: Self-pay

## 2020-12-12 ENCOUNTER — Observation Stay (HOSPITAL_COMMUNITY): Payer: Medicare Other

## 2020-12-12 DIAGNOSIS — R531 Weakness: Secondary | ICD-10-CM | POA: Diagnosis not present

## 2020-12-12 DIAGNOSIS — N179 Acute kidney failure, unspecified: Secondary | ICD-10-CM | POA: Diagnosis not present

## 2020-12-12 DIAGNOSIS — R739 Hyperglycemia, unspecified: Secondary | ICD-10-CM | POA: Diagnosis not present

## 2020-12-12 DIAGNOSIS — I5032 Chronic diastolic (congestive) heart failure: Secondary | ICD-10-CM | POA: Diagnosis not present

## 2020-12-12 DIAGNOSIS — I10 Essential (primary) hypertension: Secondary | ICD-10-CM

## 2020-12-12 DIAGNOSIS — E1142 Type 2 diabetes mellitus with diabetic polyneuropathy: Secondary | ICD-10-CM

## 2020-12-12 DIAGNOSIS — E1165 Type 2 diabetes mellitus with hyperglycemia: Principal | ICD-10-CM

## 2020-12-12 DIAGNOSIS — R296 Repeated falls: Secondary | ICD-10-CM

## 2020-12-12 DIAGNOSIS — E782 Mixed hyperlipidemia: Secondary | ICD-10-CM

## 2020-12-12 LAB — GLUCOSE, CAPILLARY
Glucose-Capillary: 242 mg/dL — ABNORMAL HIGH (ref 70–99)
Glucose-Capillary: 241 mg/dL — ABNORMAL HIGH (ref 70–99)
Glucose-Capillary: 313 mg/dL — ABNORMAL HIGH (ref 70–99)

## 2020-12-12 LAB — BASIC METABOLIC PANEL
Anion gap: 12 (ref 5–15)
Anion gap: 13 (ref 5–15)
Anion gap: 12 (ref 5–15)
BUN: 49 mg/dL — ABNORMAL HIGH (ref 6–20)
BUN: 49 mg/dL — ABNORMAL HIGH (ref 6–20)
BUN: 43 mg/dL — ABNORMAL HIGH (ref 6–20)
CO2: 23 mmol/L (ref 22–32)
CO2: 26 mmol/L (ref 22–32)
CO2: 23 mmol/L (ref 22–32)
Calcium: 9 mg/dL (ref 8.9–10.3)
Calcium: 9.5 mg/dL (ref 8.9–10.3)
Calcium: 9.2 mg/dL (ref 8.9–10.3)
Chloride: 92 mmol/L — ABNORMAL LOW (ref 98–111)
Chloride: 91 mmol/L — ABNORMAL LOW (ref 98–111)
Chloride: 95 mmol/L — ABNORMAL LOW (ref 98–111)
Creatinine, Ser: 1.93 mg/dL — ABNORMAL HIGH (ref 0.61–1.24)
Creatinine, Ser: 1.77 mg/dL — ABNORMAL HIGH (ref 0.61–1.24)
Creatinine, Ser: 1.68 mg/dL — ABNORMAL HIGH (ref 0.61–1.24)
GFR, Estimated: 39 mL/min — ABNORMAL LOW (ref >60)
GFR, Estimated: 44 mL/min — ABNORMAL LOW (ref >60)
GFR, Estimated: 47 mL/min — ABNORMAL LOW (ref >60)
Glucose, Bld: 430 mg/dL — ABNORMAL HIGH (ref 70–99)
Glucose, Bld: 287 mg/dL — ABNORMAL HIGH (ref 70–99)
Glucose, Bld: 268 mg/dL — ABNORMAL HIGH (ref 70–99)
Potassium: 3.9 mmol/L (ref 3.5–5.1)
Potassium: 4 mmol/L (ref 3.5–5.1)
Potassium: 4 mmol/L (ref 3.5–5.1)
Sodium: 127 mmol/L — ABNORMAL LOW (ref 135–145)
Sodium: 130 mmol/L — ABNORMAL LOW (ref 135–145)
Sodium: 130 mmol/L — ABNORMAL LOW (ref 135–145)

## 2020-12-12 LAB — URINALYSIS, ROUTINE W REFLEX MICROSCOPIC
Bacteria, UA: NONE SEEN
Bilirubin Urine: NEGATIVE
Glucose, UA: >=500 mg/dL — AB
Hgb urine dipstick: NEGATIVE
Ketones, ur: NEGATIVE mg/dL
Leukocytes,Ua: NEGATIVE
Nitrite: NEGATIVE
Protein, ur: NEGATIVE mg/dL
Specific Gravity, Urine: 1.021 (ref 1.005–1.030)
pH: 5 (ref 5.0–8.0)

## 2020-12-12 LAB — PHOSPHORUS: Phosphorus: 4.5 mg/dL (ref 2.5–4.6)

## 2020-12-12 LAB — TROPONIN I (HIGH SENSITIVITY): Troponin I (High Sensitivity): 4 ng/L (ref <18)

## 2020-12-12 LAB — SARS CORONAVIRUS 2 (TAT 6-24 HRS): SARS Coronavirus 2: NEGATIVE

## 2020-12-12 LAB — CBG MONITORING, ED
Glucose-Capillary: >600 mg/dL (ref 70–99)
Glucose-Capillary: >600 mg/dL (ref 70–99)
Glucose-Capillary: >600 mg/dL (ref 70–99)
Glucose-Capillary: 559 mg/dL (ref 70–99)
Glucose-Capillary: 426 mg/dL — ABNORMAL HIGH (ref 70–99)
Glucose-Capillary: 481 mg/dL — ABNORMAL HIGH (ref 70–99)
Glucose-Capillary: 392 mg/dL — ABNORMAL HIGH (ref 70–99)
Glucose-Capillary: 304 mg/dL — ABNORMAL HIGH (ref 70–99)
Glucose-Capillary: 274 mg/dL — ABNORMAL HIGH (ref 70–99)
Glucose-Capillary: 207 mg/dL — ABNORMAL HIGH (ref 70–99)
Glucose-Capillary: 263 mg/dL — ABNORMAL HIGH (ref 70–99)

## 2020-12-12 LAB — TSH: TSH: 1.74 u[IU]/mL (ref 0.350–4.500)

## 2020-12-12 LAB — MAGNESIUM
Magnesium: 2.5 mg/dL — ABNORMAL HIGH (ref 1.7–2.4)
Magnesium: 2.6 mg/dL — ABNORMAL HIGH (ref 1.7–2.4)
Magnesium: 2.3 mg/dL (ref 1.7–2.4)

## 2020-12-12 LAB — BRAIN NATRIURETIC PEPTIDE: B Natriuretic Peptide: 14.6 pg/mL (ref 0.0–100.0)

## 2020-12-12 LAB — HIV ANTIBODY (ROUTINE TESTING W REFLEX): HIV Screen 4th Generation wRfx: NONREACTIVE

## 2020-12-12 LAB — MRSA PCR SCREENING: MRSA by PCR: POSITIVE — AB

## 2020-12-12 LAB — OSMOLALITY: Osmolality: 309 mOsm/kg — ABNORMAL HIGH (ref 275–295)

## 2020-12-12 MED ORDER — INSULIN GLARGINE 100 UNIT/ML ~~LOC~~ SOLN
45.0000 [IU] | Freq: Two times a day (BID) | SUBCUTANEOUS | Status: DC
  Administered 2020-12-12 (×2): 45 [IU] via SUBCUTANEOUS
  Filled 2020-12-12 (×5): qty 0.45

## 2020-12-12 MED ORDER — INSULIN REGULAR(HUMAN) IN NACL 100-0.9 UT/100ML-% IV SOLN
INTRAVENOUS | Status: DC
  Administered 2020-12-12: 10 [IU]/h via INTRAVENOUS
  Filled 2020-12-12: qty 100

## 2020-12-12 MED ORDER — INSULIN GLARGINE 100 UNIT/ML ~~LOC~~ SOLN: 60.0000 [IU] | Freq: Two times a day (BID) | SUBCUTANEOUS | Status: DC

## 2020-12-12 MED ORDER — GABAPENTIN 400 MG PO CAPS
800.0000 mg | ORAL_CAPSULE | Freq: Three times a day (TID) | ORAL | Status: DC
  Administered 2020-12-12 – 2020-12-14 (×6): 800 mg via ORAL
  Filled 2020-12-12 (×6): qty 2

## 2020-12-12 MED ORDER — METOPROLOL TARTRATE 12.5 MG HALF TABLET
12.5000 mg | ORAL_TABLET | Freq: Two times a day (BID) | ORAL | Status: DC
  Administered 2020-12-12 – 2020-12-13 (×3): 12.5 mg via ORAL
  Filled 2020-12-12 (×5): qty 1

## 2020-12-12 MED ORDER — INSULIN ASPART 100 UNIT/ML ~~LOC~~ SOLN
0.0000 [IU] | Freq: Three times a day (TID) | SUBCUTANEOUS | Status: DC
  Administered 2020-12-12 (×3): 5 [IU] via SUBCUTANEOUS
  Administered 2020-12-13: 3 [IU] via SUBCUTANEOUS
  Administered 2020-12-13: 11 [IU] via SUBCUTANEOUS
  Administered 2020-12-13: 8 [IU] via SUBCUTANEOUS
  Administered 2020-12-14 (×2): 11 [IU] via SUBCUTANEOUS

## 2020-12-12 MED ORDER — ACETAMINOPHEN 325 MG PO TABS: 650.0000 mg | ORAL_TABLET | Freq: Four times a day (QID) | ORAL | Status: DC | PRN

## 2020-12-12 MED ORDER — DEXTROSE IN LACTATED RINGERS 5 % IV SOLN: INTRAVENOUS | Status: DC

## 2020-12-12 MED ORDER — HYDROCODONE-ACETAMINOPHEN 5-325 MG PO TABS: 1.0000 | ORAL_TABLET | Freq: Two times a day (BID) | ORAL | Status: DC | PRN

## 2020-12-12 MED ORDER — TIZANIDINE HCL 4 MG PO TABS: 4.0000 mg | ORAL_TABLET | Freq: Every evening | ORAL | Status: DC | PRN

## 2020-12-12 MED ORDER — MUPIROCIN 2 % EX OINT
1.0000 "application " | TOPICAL_OINTMENT | Freq: Two times a day (BID) | CUTANEOUS | Status: DC
  Administered 2020-12-12 – 2020-12-14 (×4): 1 via NASAL
  Filled 2020-12-12 (×2): qty 22

## 2020-12-12 MED ORDER — POTASSIUM CHLORIDE 2 MEQ/ML IV SOLN
INTRAVENOUS | Status: DC
  Filled 2020-12-12: qty 1000

## 2020-12-12 MED ORDER — SPIRONOLACTONE 25 MG PO TABS
50.0000 mg | ORAL_TABLET | Freq: Every day | ORAL | Status: DC
  Administered 2020-12-12 – 2020-12-14 (×3): 50 mg via ORAL
  Filled 2020-12-12 (×3): qty 2

## 2020-12-12 MED ORDER — INSULIN ASPART 100 UNIT/ML ~~LOC~~ SOLN
40.0000 [IU] | Freq: Three times a day (TID) | SUBCUTANEOUS | Status: DC
  Administered 2020-12-12 (×3): 40 [IU] via SUBCUTANEOUS

## 2020-12-12 MED ORDER — LACTATED RINGERS IV SOLN: INTRAVENOUS | Status: DC

## 2020-12-12 MED ORDER — ASPIRIN EC 81 MG PO TBEC
81.0000 mg | DELAYED_RELEASE_TABLET | Freq: Every day | ORAL | Status: DC
  Administered 2020-12-12 – 2020-12-14 (×3): 81 mg via ORAL
  Filled 2020-12-12 (×4): qty 1

## 2020-12-12 MED ORDER — PANTOPRAZOLE SODIUM 40 MG PO TBEC
40.0000 mg | DELAYED_RELEASE_TABLET | Freq: Every day | ORAL | Status: DC
  Administered 2020-12-12 – 2020-12-14 (×3): 40 mg via ORAL
  Filled 2020-12-12 (×3): qty 1

## 2020-12-12 MED ORDER — FUROSEMIDE 40 MG PO TABS
40.0000 mg | ORAL_TABLET | Freq: Every day | ORAL | Status: DC
  Administered 2020-12-13 – 2020-12-14 (×2): 40 mg via ORAL
  Filled 2020-12-12 (×3): qty 1

## 2020-12-12 MED ORDER — ATORVASTATIN CALCIUM 80 MG PO TABS
80.0000 mg | ORAL_TABLET | Freq: Every day | ORAL | Status: DC
  Administered 2020-12-12 – 2020-12-13 (×2): 80 mg via ORAL
  Filled 2020-12-12 (×2): qty 1

## 2020-12-12 MED ORDER — CHLORHEXIDINE GLUCONATE CLOTH 2 % EX PADS
6.0000 | MEDICATED_PAD | Freq: Every day | CUTANEOUS | Status: DC
  Administered 2020-12-13 – 2020-12-14 (×2): 6 via TOPICAL

## 2020-12-12 MED ORDER — ALLOPURINOL 300 MG PO TABS
300.0000 mg | ORAL_TABLET | Freq: Every day | ORAL | Status: DC
  Administered 2020-12-12 – 2020-12-14 (×3): 300 mg via ORAL
  Filled 2020-12-12 (×3): qty 1

## 2020-12-12 MED ORDER — ACETAMINOPHEN 650 MG RE SUPP: 650.0000 mg | Freq: Four times a day (QID) | RECTAL | Status: DC | PRN

## 2020-12-12 MED ORDER — DEXTROSE 50 % IV SOLN: 0.0000 mL | INTRAVENOUS | Status: DC | PRN

## 2020-12-12 MED ORDER — INSULIN ASPART 100 UNIT/ML ~~LOC~~ SOLN
0.0000 [IU] | Freq: Every day | SUBCUTANEOUS | Status: DC
  Administered 2020-12-12: 4 [IU] via SUBCUTANEOUS
  Administered 2020-12-13: 3 [IU] via SUBCUTANEOUS

## 2020-12-12 NOTE — Progress Notes (Signed)
CSW attempted to see patient but he would not awaken from sleep. Will speak with him tomorrow.   Tariya Morrissette LCSW

## 2020-12-12 NOTE — Progress Notes (Signed)
Patient seen and examined.  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  Blood sugars more than 500 with no anion gap. Patient is without nausea vomiting.  Does not have any anion gap.  Given resuscitated IV fluid and started on IV insulin.  Difficult IV lines.  Plan: Stop IV insulin, will start patient on subcu insulin.  He does take NovoLog 45 units with meals 3 times a day however does not take any long-acting insulin. We will start with Lantus 45 units twice a day, mealtime insulin 40 units 3 times a day, allow carb controlled diet. PT OT as planned. Repeat electrolytes today morning.  Including magnesium and phosphorus. We will check CT scan of the head due to recurrent ataxia to rule out any subacute stroke. All his symptoms are likely due to uncontrolled blood sugars.

## 2020-12-12 NOTE — ED Notes (Signed)
Patient sitting up eating.

## 2020-12-12 NOTE — Discharge Instructions (Signed)
Ask Endocrinologist (diabetes provider) about prescribing FreeStyle Libre for glucose monitoring if interested in using it.

## 2020-12-12 NOTE — Telephone Encounter (Signed)
Spoke to wife-she states patient is currently admitted at [facility] and wanted to make Dr. Royann Shivers aware.   Advised typically cardiology is consulted by admitting team (if appropriate) and a provider will see patient at hospital as Dr. Royann Shivers is not working in hospital this week.    Advised would make MD aware.

## 2020-12-12 NOTE — ED Notes (Signed)
Insuline given for BLG of 263, PT at bedside, walking in room with walker.

## 2020-12-12 NOTE — TOC Benefit Eligibility Note (Signed)
Transition of Care [facility]) Benefit Eligibility Note    Patient Details  Name: Eric [name] MRN: [id number] Date of Birth: [birth year]   Medication/Dose: LANTU 100 MG BID  PEN   :  Covered?: Yes  Tier: 3 Drug  Prescription Coverage [facility]: [facility] and   90 DAY SUPPLY FOR M/O $47.00  Spoke with Person/Company/Phone Number:: KEVIN @ [facility] #  [phone]  Co-Pay: $  47.00  Prior Approval: No  Deductible:  (NO DEDUCTIBLE WITH PLAN and OUT-OF-POCKET:UNMET)  Additional Notes: PT'S PLAN DON'T COVER LANTUS 45 UNITS  BID    Mardene Sayer Phone Number: 12/12/2020, 5:32 PM

## 2020-12-12 NOTE — Plan of Care (Signed)
  Problem: Health Behavior/Discharge Planning: Goal: Ability to manage health-related needs will improve Outcome: Progressing   Problem: Clinical Measurements: Goal: Ability to maintain clinical measurements within normal limits will improve Outcome: Progressing Goal: Will remain free from infection Outcome: Progressing Goal: Diagnostic test results will improve Outcome: Progressing Goal: Respiratory complications will improve Outcome: Progressing Goal: Cardiovascular complication will be avoided Outcome: Progressing   Problem: Activity: Goal: Risk for activity intolerance will decrease Outcome: Progressing   Problem: Nutrition: Goal: Adequate nutrition will be maintained Outcome: Progressing   Problem: Coping: Goal: Level of anxiety will decrease Outcome: Progressing   Problem: Elimination: Goal: Will not experience complications related to bowel motility Outcome: Progressing Goal: Will not experience complications related to urinary retention Outcome: Progressing   Problem: Pain Managment: Goal: General experience of comfort will improve Outcome: Progressing   Problem: Safety: Goal: Ability to remain free from injury will improve Outcome: Progressing   Problem: Skin Integrity: Goal: Risk for impaired skin integrity will decrease Outcome: Progressing   Problem: Coping: Goal: Ability to adjust to condition or change in health will improve Outcome: Progressing   Problem: Fluid Volume: Goal: Ability to maintain a balanced intake and output will improve Outcome: Progressing   Problem: Nutritional: Goal: Maintenance of adequate nutrition will improve Outcome: Progressing Goal: Progress toward achieving an optimal weight will improve Outcome: Progressing   Problem: Skin Integrity: Goal: Risk for impaired skin integrity will decrease Outcome: Progressing   Problem: Tissue Perfusion: Goal: Adequacy of tissue perfusion will improve Outcome: Progressing    Problem: Education: Goal: Knowledge of General Education information will improve Description: Including pain rating scale, medication(s)/side effects and non-pharmacologic comfort measures Outcome: Not Progressing   Problem: Education: Goal: Ability to describe self-care measures that may prevent or decrease complications (Diabetes Survival Skills Education) will improve Outcome: Not Progressing   Problem: Health Behavior/Discharge Planning: Goal: Ability to identify and utilize available resources and services will improve Outcome: Not Progressing Goal: Ability to manage health-related needs will improve Outcome: Not Progressing   Problem: Metabolic: Goal: Ability to maintain appropriate glucose levels will improve Outcome: Not Progressing

## 2020-12-12 NOTE — Progress Notes (Signed)
Pt arrived to 5W room 36 from ED, pt A&Ox 4, Skin clean dry and intact with noted abrasions and excoriation on bilateral legs. Pt advised of belongings policy, noted to have clothing, eye glasses, wallet, and cell phone with charger at bedside. Pt oriented to unit, and instructed how to reach staff, bed alarm on and bed in lowest position with phone and call bell in reach . VSS BP 109/72   Pulse 92   Temp (!) 97.4 F (36.3 C) (Oral)   Resp (!) 22   Ht 5\' 6"  (1.676 m)   Wt 80.3 kg   SpO2 90%   BMI 28.57 kg/m

## 2020-12-12 NOTE — ED Notes (Signed)
Nurse report given to Rae Roam., RN

## 2020-12-12 NOTE — ED Notes (Signed)
Floor nurse needs to review the chart but I can give report.

## 2020-12-12 NOTE — ED Provider Notes (Signed)
[name] Provider Note   CSN: [id number] Arrival date & time: 12/11/20  1941     History Chief Complaint  Patient presents with  . Extremity Weakness    Eric PetersKelly [name] is a 60 y.o. male.  Patient with history of T2DM, insulin noncomplicance, CAD, HLD, stroke, chronic dCHF, OSA, HTN presents for evaluation of lower extremity weakness and multiple falls. He states his legs "feel like cement", and he falls after his legs "give out". He denies LE swelling or pain. This has been a problem over the last one month, progressively worsening. For the past 3 days, he is experiencing intermittent, sharp, non-radiating left sided chest pain that lasts about 15 minutes and seems to go away when he applies pressure to the area. No change in his chronic dyspnea. No fever, congestion. He is not COVID vaccinated. He reports chronically elevated blood sugar and admits he does not use his insulin as prescribed. He is compliant with his glipizide, and all other medications. He does not check his blood sugar regularly. No nausea, vomiting, diarrhea. He denies any significant injury from recent falls.   The history is provided by the patient and the spouse. No language interpreter was used.  Extremity Weakness Associated symptoms include chest pain and shortness of breath. Pertinent negatives include no abdominal pain.       Past Medical History:  Diagnosis Date  . Coronary artery disease   . Diabetes mellitus without complication (HCC)   . Fatty liver   . Hyperlipidemia   . Stroke [facility](HCC)     Patient Active Problem List   Diagnosis Date Noted  . Restrictive lung disease 12/22/2019  . Atypical angina (HCC)   . DOE (dyspnea on exertion) 05/04/2019  . PAF- transient post CABG 05/04/2019  . Coronary artery disease involving autologous vein coronary bypass graft without angina pectoris 11/10/2018  . Hx of CABG 11/09/2018  . Abnormal CT scan, heart   . Mild  obesity 11/06/2018  . Uncontrolled type 2 diabetes mellitus with hyperglycemia (HCC) 11/06/2018  . Acute on chronic diastolic (congestive) heart failure (HCC) 11/06/2018  . Mixed hyperlipidemia 10/10/2018  . DM (diabetes mellitus), type 2, uncontrolled w/neurologic complication (HCC) 10/10/2018  . History of transient ischemic attack (TIA) 10/10/2018  . OSA (obstructive sleep apnea) 10/10/2018  . Essential hypertension 10/10/2018  . Chest pain 05/16/2018  . Type 2 diabetes mellitus with hyperlipidemia (HCC) 05/16/2018  . Vitamin D deficiency 11/02/2015  . Obesity (BMI 30-39.9) 04/17/2014  . Reflux gastritis 04/17/2014  . Hiatal hernia without gangrene and obstruction 04/17/2014  . Plantar fasciitis, left 03/02/2014  . Diabetic peripheral neuropathy associated with type 2 diabetes mellitus (HCC) 11/22/2013  . Epididymitis 02/04/2012  . UTI (urinary tract infection) 02/04/2012  . DDD (degenerative disc disease), cervical 10/24/2010  . Gout, unspecified 10/24/2010  . OA (osteoarthritis) 10/24/2010  . Allergic rhinitis 03/04/2007  . Asthma 03/04/2007    Past Surgical History:  Procedure Laterality Date  . CHOLECYSTECTOMY    . CORONARY ARTERY BYPASS GRAFT N/A 11/10/2018   Procedure: CORONARY ARTERY BYPASS GRAFTING (CABG) x Two , using left internal mammary artery and right leg greater saphenous vein harvested endoscopically - SVG to Ramus, LIMA to LAD;  Surgeon: Delight OvensGerhardt, Edward B, MD;  [facility];  Service: Open Heart Surgery;  Laterality: N/A;  . INTRAVASCULAR PRESSURE WIRE/FFR STUDY N/A 12/16/2019   Procedure: INTRAVASCULAR PRESSURE WIRE/FFR STUDY;  Surgeon: Marykay LexHarding, David W, MD;  [facility];  Service:  Cardiovascular;  Laterality: N/A;  . LEFT HEART CATH AND CORONARY ANGIOGRAPHY N/A 11/09/2018   Procedure: LEFT HEART CATH AND CORONARY ANGIOGRAPHY;  Surgeon: Lennette Bihari, MD;  Location: [facility] INVASIVE CV LAB;  Service: Cardiovascular;  Laterality: N/A;  . RIGHT/LEFT  HEART CATH AND CORONARY/GRAFT ANGIOGRAPHY N/A 12/16/2019   Procedure: RIGHT/LEFT HEART CATH AND CORONARY/GRAFT ANGIOGRAPHY;  Surgeon: Marykay Lex, MD;  Location: [facility] INVASIVE CV LAB;  Service: Cardiovascular;  Laterality: N/A;  . SHOULDER SURGERY Left   . TEE WITHOUT CARDIOVERSION N/A 11/10/2018   Procedure: TRANSESOPHAGEAL ECHOCARDIOGRAM (TEE);  Surgeon: Delight Ovens, MD;  Location: [name];  Service: Open Heart Surgery;  Laterality: N/A;       Family History  Problem Relation Age of Onset  . Hypothyroidism Mother   . Hyperlipidemia Mother   . Heart attack Father   . Heart failure Father     Social History   Tobacco Use  . Smoking status: Former Smoker    Packs/day: 1.00    Years: 12.00    Pack years: 12.00    Types: Cigarettes    Start date: 1968    Quit date: 1981    Years since quitting: 41.3  . Smokeless tobacco: Current User    Types: Chew  Vaping Use  . Vaping Use: Never used  Substance Use Topics  . Alcohol use: Yes    Comment: rare social couple of times a year  . Drug use: Never    Home Medications Prior to Admission medications   Medication Sig Start Date End Date Taking? Authorizing Provider  ACCU-CHEK AVIVA PLUS test strip  10/18/18   [provider]  allopurinol (ZYLOPRIM) 300 MG tablet Take 300 mg by mouth daily.  07/19/19   [provider]  atorvastatin (LIPITOR) 80 MG tablet TAKE 1 TABLET BY MOUTH  DAILY AT 6 PM 02/22/20   Jodelle Gross, NP  Bilberry, Vaccinium myrtillus, (BILBERRY PO) Take 1 tablet by mouth daily.    [provider]  Easy Comfort Lancets MISC  06/15/19   [provider]  furosemide (LASIX) 40 MG tablet Take 1 tablet (40 mg total) by mouth daily. 06/11/20   Croitoru, Mihai, MD  gabapentin (NEURONTIN) 400 MG capsule Take 800 mg by mouth 3 (three) times daily.  07/19/19   [provider]  glipiZIDE (GLUCOTROL) 5 MG tablet Take 5 mg by mouth daily. 03/12/18   [provider]   insulin aspart (FIASP FLEXTOUCH) 100 UNIT/ML FlexTouch Pen Inject 30 Units into the skin daily.  09/26/19   [provider]  insulin glargine, 2 Unit Dial, (TOUJEO MAX) 300 UNIT/ML Solostar Pen Inject 120 Units into the skin daily.  09/30/19   [provider]  Insulin Pen Needle (B-D ULTRAFINE III SHORT PEN) 31G X 8 MM MISC Use to inject insulin 1 time daily 09/26/19   [provider]  metoprolol tartrate (LOPRESSOR) 25 MG tablet TAKE ONE-HALF TABLET BY  MOUTH TWICE DAILY 04/16/20   Marjie Skiff E, PA-C  omeprazole (PRILOSEC) 20 MG capsule 40 mg daily.  11/22/19   [provider]    Allergies    Bee venom and Lisinopril  Review of Systems   Review of Systems  Constitutional: Negative for chills and fever.  HENT: Negative.  Negative for congestion.   Eyes: Negative for visual disturbance.  Respiratory: Positive for shortness of breath.   Cardiovascular: Positive for chest pain. Negative for leg swelling.  Gastrointestinal: Negative.  Negative for abdominal pain,  nausea and vomiting.  Endocrine: Positive for polyuria.  Genitourinary: Positive for frequency.  Musculoskeletal: Positive for extremity weakness.       See HPI.  Skin: Negative.   Neurological: Positive for weakness. Negative for light-headedness.  Psychiatric/Behavioral: Negative for confusion.    Physical Exam Updated Vital Signs BP 99/85 (BP Location: Right Arm)   Pulse 87   Temp 98.4 F (36.9 C) (Oral)   Resp 16   Ht 5\' 6"  (1.676 m)   Wt 80.3 kg   SpO2 96%   BMI 28.57 kg/m   Physical Exam Constitutional:      General: He is not in acute distress.    Appearance: Normal appearance. He is well-developed. He is obese.  HENT:     Head: Normocephalic and atraumatic.     Mouth/Throat:     Mouth: Mucous membranes are moist.  Eyes:     Conjunctiva/sclera: Conjunctivae normal.  Cardiovascular:     Rate and Rhythm: Normal rate and regular rhythm.     Pulses: Normal pulses.      Heart sounds: No murmur heard.   Pulmonary:     Effort: Pulmonary effort is normal.     Breath sounds: Rales (Bibasilar) present. No wheezing or rhonchi.  Abdominal:     Palpations: Abdomen is soft.     Tenderness: There is abdominal tenderness (Right mid-abdominal tenderness to palpation). There is no guarding or rebound.  Musculoskeletal:        General: No swelling. Normal range of motion.     Cervical back: Normal range of motion and neck supple.     Right lower leg: No edema.     Left lower leg: No edema.  Skin:    General: Skin is warm and dry.  Neurological:     Mental Status: He is alert and oriented to person, place, and time.     Sensory: Sensory deficit (Bilateral feet - h/o neuropathy) present.     ED Results / Procedures / Treatments   Labs (all labs ordered are listed, but only abnormal results are displayed) Labs Reviewed  BASIC METABOLIC PANEL - Abnormal; Notable for the following components:      Result Value   Sodium 123 (*)    Potassium 5.3 (*)    Chloride 86 (*)    Glucose, Bld 643 (*)    BUN 57 (*)    Creatinine, Ser 2.41 (*)    GFR, Estimated 30 (*)    All other components within normal limits  SARS CORONAVIRUS 2 (TAT 6-24 HRS)  CBC  URINALYSIS, ROUTINE W REFLEX MICROSCOPIC  BRAIN NATRIURETIC PEPTIDE  CBG MONITORING, ED  CBG MONITORING, ED  TROPONIN I (HIGH SENSITIVITY)  TROPONIN I (HIGH SENSITIVITY)    EKG None  Radiology DG Chest 2 View  Result Date: 12/11/2020 CLINICAL DATA:  Chest pain EXAM: CHEST - 2 VIEW COMPARISON:  01/25/2019 FINDINGS: Post sternotomy changes. Fracture through first sternal wire as before. No acute airspace disease or pleural effusion. No pneumothorax. Low lung volumes. IMPRESSION: Low lung volumes. Electronically Signed   By: 01/27/2019 M.D.   On: 12/11/2020 22:04    Procedures Procedures CRITICAL CARE Performed by: 02/10/2021   Total critical care time: 50 minutes  Critical care time was exclusive  of separately billable procedures and treating other patients.  Critical care was necessary to treat or prevent imminent or life-threatening deterioration.  Critical care was time spent personally by me on the following activities: development of treatment plan  with patient and/or surrogate as well as nursing, discussions with consultants, evaluation of patient's response to treatment, examination of patient, obtaining history from patient or surrogate, ordering and performing treatments and interventions, ordering and review of laboratory studies, ordering and review of radiographic studies, pulse oximetry and re-evaluation of patient's condition.   Medications Ordered in ED Medications  insulin regular, human (MYXREDLIN) 100 units/ 100 mL infusion (has no administration in time range)  lactated ringers infusion (has no administration in time range)  dextrose 5 % in lactated ringers infusion (has no administration in time range)  dextrose 50 % solution 0-50 mL (has no administration in time range)    ED Course  I have reviewed the triage vital signs and the nursing notes.  Pertinent labs & imaging results that were available during my care of the patient were reviewed by me and considered in my medical decision making (see chart for details).  Clinical Course as of 12/12/20 0038  Wed Dec 12, 2020  0008 Patient not in the room [SU]    Clinical Course User Index [SU] Elpidio Anis, PA-C   MDM Rules/Calculators/A&P                          Patient to ED with ss/sxs as detailed in the HPI.  Overall chronically ill but nontoxic appearing. Alert, oriented. Wife at bedside that provides additional detail to history.   He is found to have significant hyperglycemia without evidence DKA. Review of chart shows past A1c of over 17 (5/21). Corrected sodium 132. His renal function is abnormal with Cr 2.41 where baseline appears to be around 1.4-1.5. Ch 86 suggesting element of dehydration.  Glycemic control started with Endotool.  History of CHF. No fluid overload evident. BNP pending but no dependent edema or pulmonary edema on CXR. Hydration will need to be over time.   Given hyperglycemia, electrolyte derangements, multiple falls, will plan to admit the patient for stabilization, PT eval/therapy.   Discussed with Dr. Marilynn Rail, [facility], who accepts the patient for admission.   Final Clinical Impression(s) / ED Diagnoses Final diagnoses:  None   1. Hyperglycemia 2. AKI 3. Weakness  Rx / DC Orders ED Discharge Orders    None       Elpidio Anis, PA-C 12/12/20 0246    Geoffery Lyons, MD 12/12/20 [id number]

## 2020-12-12 NOTE — Progress Notes (Signed)
Inpatient Diabetes Program Recommendations  AACE/ADA: New Consensus Statement on Inpatient Glycemic Control   Target Ranges:  Prepandial:   less than 140 mg/dL      Peak postprandial:   less than 180 mg/dL (1-2 hours)      Critically ill patients:  140 - 180 mg/dL   Results for LEVERT, HESLOP (MRN [id number]) as of 12/12/2020 08:19  Ref. Range 12/12/2020 01:10 12/12/2020 01:51 12/12/2020 02:23 12/12/2020 02:59 12/12/2020 03:37 12/12/2020 04:20 12/12/2020 04:55 12/12/2020 06:05 12/12/2020 07:02 12/12/2020 08:01  Glucose-Capillary Latest Ref Range: 70 - 99 mg/dL >704 (HH) >888 (HH) >916 (HH) 559 (HH) 481 (H) 426 (H) 392 (H) 304 (H) 274 (H) 207 (H)  Results for TEEJAY, MEADER (MRN [id number]) as of 12/12/2020 08:19  Ref. Range 12/11/2020 21:39  Glucose Latest Ref Range: 70 - 99 mg/dL 800 (HH)   Review of Glycemic Control  Diabetes history: DM2 Outpatient Diabetes medications: Toujeo 125 units daily, Humalog 45 units TID with meals, Glipizide 5 mg daily, Fiasp 30 units daily Current orders for Inpatient glycemic control: Lantus 45 units BID, Novolog 0-15 units TID with meals, Novolog 0-5 units QHS, Novolog 40 units TID with meals  Inpatient Diabetes Program Recommendations:    HbgA1C:  A1C in Care Everywhere 17.6% on 01/21/20. Current A1C in process.  NOTE: Noted consult for diabetes coordinator. Per H&P on 12/12/20, patient "acknowledges that he rarely takes his basal insulin because of a perceived associated sensation with this injection.  Consequently, he estimates that he has taken his basal insulin 1 time over the last 30 days.  He reports good compliance with his glipizide as a sole outpatient oral hypoglycemic agent.  He also acknowledges that he does not routinely check his blood sugar. Patient would like to discuss the possibility of switching to a different basal insulin that may not create the reported "burning' sensation that the patient has reportedly experienced with his Toujeo.  He reports that he  would be significantly more compliant with his basal insulin in the absence of his burning sensation."   Spoke with patient about diabetes and home regimen for diabetes control. Patient reports being followed by Endocrinologist at [facility] for diabetes management but notes that he is planning to start seeing an Endocrinologist with [facility] and his initial appointment is on 02/04/21.  Patient reports that he is prescribed Toujeo 125 units QHS, Humalog 45 units TID with meals, and Glipizide 5 mg daily as an outpatient for diabetes control.  Patient reports that he is not taking the Toujeo very often because it burns so bad.  Patient states he tries to take the Humalog 45 units TID with meals but admits that sometimes when he goes out to eat he forgets to take the Humalog.  Patient has everything at home for glucose monitoring and checks glucose 1-2 times a day. Patient states it is frustrating because it is always high and sometimes he just doesn't bother with checking it.  Patient reports that he has learned that he gets very sleepy and tired when glucose is high and his vision is blurry when his glucose is low.  Patient states that he is taking insulin in his abdomen mainly but uses other areas at times. Discussed last A1C of 17.2% in May 2021. Discussed glucose and A1C goals. Discussed importance of checking CBGs and maintaining good CBG control to prevent long-term and short-term complications. Explained how hyperglycemia leads to damage within blood vessels which lead to the common complications seen with uncontrolled diabetes. Stressed to  the patient the importance of improving glycemic control to prevent further complications from uncontrolled diabetes. Discussed impact of nutrition, exercise, stress, sickness, and medications on diabetes control.  Patient reports that when he drinks water his glucose goes up but when he drinks regular sodas it does not effect his glucose. Discussed impact of carbohydrates on  glucose and encouraged patient to eliminate sugary beverages. Patient states he has bad neuropathy in his hands and feet. Discussed how glucose can effect nerve endings and cause neuropathy if DM is uncontrolled. Encouraged patient to be sure to go to his appointment with new Endocrinologist. Discussed using a different basal insulin and patient states he would be willing to take a different basal insulin as long as it did not burn. Patient has received Lantus today and he reports no issues with Lantus that was given.  Patient states he would like to know what his copay would be to see if he can afford it so will ask [facility] to run benefit check for Lantus (vials). Discussed improving glucose monitoring and FreeStyle Libre. Patient states that his insurance would not cover the FreeStyle Libre in the past. Encouraged patient to ask new Endocrinologist about prescribing it again to see if it would now be covered.  Patient verbalized understanding of information discussed and reports no further questions at this time related to diabetes.  Thanks, Orlando Penner, RN, MSN, CDE Diabetes Coordinator Inpatient Diabetes Program [phone] (Team Pager from 8am to 5pm)

## 2020-12-12 NOTE — H&P (Signed)
History and Physical    PLEASE NOTE THAT DRAGON DICTATION SOFTWARE WAS USED IN THE CONSTRUCTION OF THIS NOTE.   Eric [name] RUE:[id number] DOB: 03-Jun-1961 DOA: 12/11/2020  PCP: Zenaida Deed, Hidi, MD Patient coming from: home   I have personally briefly reviewed patient's old medical records in [facility]  Chief Complaint: generalized weakness  HPI: Eric [name] is a 60 y.o. male with medical history significant for poorly controlled type 2 diabetes mellitus with most recent hemoglobin A1c 17.6% in May 2021 and complicated by peripheral polyneuropathy, chronic diastolic heart failure, hyperlipidemia, hypertension, stage III chronic kidney disease with baseline creatinine 1.2-1.6, who is admitted to [facility] on 12/11/2020 with hyperglycemia after presenting from home to [facility] ED complaining of generalized weakness.   The patient reports approximately 6 weeks of progressive generalized weakness in the absence of any acute focal weakness.  He also denies associated dysarthria, facial droop, acute change in vision, vertigo or dysphagia over that time.  In the setting of his generalized weakness, the patient reports that he has incurred 8-10 ground-level mechanical falls over the last 6 weeks.  He describes the x-ray as follows as development of symmetrical lower extremity weakness causing him to typically fall forward.  On one occurrence he believes that he hit his head as a component of the fall, but denies any associated loss of consciousness with any of these previous ground-level mechanical falls.  Denies any associated chest pain, diaphoresis, palpitations, shortness of breath, nausea, vomiting, dizziness, presyncope, syncope, or vertigo.   Denies any associated subjective fever, chills, rigors, or generalized myalgias.  No recent headache, neck stiffness, rhinitis, rhinorrhea, sore throat, abdominal pain, diarrhea, or rash.  No recent COVID-19 exposures.  He also denies any  recent dysuria, gross hematuria, or change in urinary urgency. .  In the context of type 2 diabetes mellitus, the patient confirms his prescribed home insulin regimen as being Toujeo 125 units sq nightly as well as NovoLog 45 units 3 times daily with meals.  While he reports good compliance in taking his short acting insulin as prescribed, he acknowledges that he rarely takes his basal insulin because of a perceived associated sensation with this injection.  Consequently, he estimates that he has taken his basal insulin 1 time over the last 30 days.  He reports good compliance with his glipizide as a sole outpatient oral hypoglycemic agent.  He also acknowledges that he does not routinely check his blood sugar, and is unsure as to the specific levels over the course of the last 4 to 6 weeks, although he reports development of progressive polydipsia and polyuria over that time. in the setting of his acknowledge suboptimal compliance with his home insulin regimen, the patient reports that he is been previously hospitalized for hyperglycemia requiring insulin drip, but denies any known history of DKA.  Denies recreational drug use or any recent alcohol consumption.  Per chart review, the patient has a history of chronic diastolic heart failure, with most recent echocardiogram performed February 2020 showing normal LV cavity size, LVEF 60 to 65%, no evidence of focal wall motion of normalities, and demonstrating evidence of impaired relaxation consistent with diastolic dysfunction in the absence of any significant valvular pathology the patient is on Lasix 4 mg p.o. daily at home.     ED Course:  Vital signs in the ED were notable for the following: Tetramex 98.4, heart rate 85-89; blood pressure 99/77-110/89; respiratory rate 16-19; oxygen saturation 95 to 98% on room air.  Labs were notable for the following: CMP was notable for the following: Sodium 123 which corrects to 131 when taking into account  concomitant hyperglycemia, potassium 5.3, bicarbonate 23, anion gap 14, BUN 57, creatinine 2.41 relative to most recent prior creatinine data point of 1.24 in April 2021, glucose 643.  CBC notable for white blood cell count of 9100.  High-sensitivity troponin I x2 was checked, with initial value found to be 5, with ensuing value demonstrating a trend down to 4.  Urinalysis showed no evidence of white blood cells, no bacteria, nitrate negative, leukocyte Estrace negative, specific gravity 1.021, and showed the presence of hyaline casts.  Screening nasopharyngeal COVID-19/influenza PCR was checked in the ED today, with result currently pending.  EKG shows normal sinus rhythm with heart rate 88, normal intervals, and no evidence of T wave or ST changes, including no evidence of ST elevation.  2 view chest x-ray showed no evidence of acute cardiopulmonary process, including no evidence of infiltrate, edema, pneumothorax, or pleural effusion.  While in the ED, the following were administered: He was started on insulin drip as well as lactated Ringer's at 125 cc/h.     Review of Systems: As per HPI otherwise 10 point review of systems negative.   Past Medical History:  Diagnosis Date  . Coronary artery disease   . Diabetes mellitus without complication (HCC)   . Fatty liver   . Hyperlipidemia   . Stroke [facility])     Past Surgical History:  Procedure Laterality Date  . CHOLECYSTECTOMY    . CORONARY ARTERY BYPASS GRAFT N/A 11/10/2018   Procedure: CORONARY ARTERY BYPASS GRAFTING (CABG) x Two , using left internal mammary artery and right leg greater saphenous vein harvested endoscopically - SVG to Ramus, LIMA to LAD;  Surgeon: Delight Ovens, MD;  Location: [facility] OR;  Service: Open Heart Surgery;  Laterality: N/A;  . INTRAVASCULAR PRESSURE WIRE/FFR STUDY N/A 12/16/2019   Procedure: INTRAVASCULAR PRESSURE WIRE/FFR STUDY;  Surgeon: Marykay Lex, MD;  Location: [facility] INVASIVE CV LAB;  Service:  Cardiovascular;  Laterality: N/A;  . LEFT HEART CATH AND CORONARY ANGIOGRAPHY N/A 11/09/2018   Procedure: LEFT HEART CATH AND CORONARY ANGIOGRAPHY;  Surgeon: Lennette Bihari, MD;  Location: [facility] INVASIVE CV LAB;  Service: Cardiovascular;  Laterality: N/A;  . RIGHT/LEFT HEART CATH AND CORONARY/GRAFT ANGIOGRAPHY N/A 12/16/2019   Procedure: RIGHT/LEFT HEART CATH AND CORONARY/GRAFT ANGIOGRAPHY;  Surgeon: Marykay Lex, MD;  Location: [facility] INVASIVE CV LAB;  Service: Cardiovascular;  Laterality: N/A;  . SHOULDER SURGERY Left   . TEE WITHOUT CARDIOVERSION N/A 11/10/2018   Procedure: TRANSESOPHAGEAL ECHOCARDIOGRAM (TEE);  Surgeon: Delight Ovens, MD;  Location: [facility] OR;  Service: Open Heart Surgery;  Laterality: N/A;    Social History:  reports that he quit smoking about 41 years ago. His smoking use included cigarettes. He started smoking about 54 years ago. He has a 12.00 pack-year smoking history. His smokeless tobacco use includes chew. He reports current alcohol use. He reports that he does not use drugs.   Allergies  Allergen Reactions  . Bee Venom Swelling  . Lisinopril Nausea And Vomiting and Cough    Coughing, sweating, sneezing    Family History  Problem Relation Age of Onset  . Hypothyroidism Mother   . Hyperlipidemia Mother   . Heart attack Father   . Heart failure Father      Prior to Admission medications   Medication Sig Start Date End Date Taking? Authorizing Provider  ACCU-CHEK AVIVA  PLUS test strip  10/18/18   [provider]  allopurinol (ZYLOPRIM) 300 MG tablet Take 300 mg by mouth daily.  07/19/19   [provider]  atorvastatin (LIPITOR) 80 MG tablet TAKE 1 TABLET BY MOUTH  DAILY AT 6 PM 02/22/20   Jodelle Gross, NP  Bilberry, Vaccinium myrtillus, (BILBERRY PO) Take 1 tablet by mouth daily.    [provider]  Easy Comfort Lancets MISC  06/15/19   [provider]  furosemide (LASIX) 40 MG tablet Take 1 tablet (40 mg total) by  mouth daily. 06/11/20   Croitoru, Mihai, MD  gabapentin (NEURONTIN) 400 MG capsule Take 800 mg by mouth 3 (three) times daily.  07/19/19   [provider]  glipiZIDE (GLUCOTROL) 5 MG tablet Take 5 mg by mouth daily. 03/12/18   [provider]  insulin aspart (FIASP FLEXTOUCH) 100 UNIT/ML FlexTouch Pen Inject 30 Units into the skin daily.  09/26/19   [provider]  insulin glargine, 2 Unit Dial, (TOUJEO MAX) 300 UNIT/ML Solostar Pen Inject 120 Units into the skin daily.  09/30/19   [provider]  Insulin Pen Needle (B-D ULTRAFINE III SHORT PEN) 31G X 8 MM MISC Use to inject insulin 1 time daily 09/26/19   [provider]  metoprolol tartrate (LOPRESSOR) 25 MG tablet TAKE ONE-HALF TABLET BY  MOUTH TWICE DAILY 04/16/20   Marjie Skiff E, PA-C  omeprazole (PRILOSEC) 20 MG capsule 40 mg daily.  11/22/19   [provider]     Objective    Physical Exam: Vitals:   12/12/20 0200 12/12/20 0215 12/12/20 0230 12/12/20 0300  BP: 117/85 106/77 99/77 108/82  Pulse: 89 87 87 87  Resp: 19 18 19  (!) 24  Temp:      TempSrc:      SpO2: 93% 93% 95% 98%  Weight:      Height:        General: appears to be stated age; alert, oriented Skin: warm, dry, no rash Head:  AT/ Mouth:  Oral mucosa membranes appear moist, normal dentition Neck: supple; trachea midline Heart:  RRR; did not appreciate any M/R/G Lungs: CTAB, did not appreciate any wheezes, rales, or rhonchi Abdomen: + BS; soft, ND, NT Vascular: 2+ pedal pulses b/l; 2+ radial pulses b/l Extremities: no peripheral edema, no muscle wasting Neuro: strength intact in upper and lower extremities b/l    Labs on Admission: I have personally reviewed following labs and imaging studies  CBC: Recent Labs  Lab 12/11/20 2139  WBC 9.1  HGB 15.7  HCT 44.4  MCV 85.4  PLT 196   Basic Metabolic Panel: Recent Labs  Lab 12/11/20 2139  NA 123*  K 5.3*  CL 86*  CO2 23  GLUCOSE 643*  BUN  57*  CREATININE 2.41*  CALCIUM 9.3   GFR: Estimated Creatinine Clearance: 32.9 mL/min (A) (by C-G formula based on SCr of 2.41 mg/dL (H)). Liver Function Tests: No results for input(s): AST, ALT, ALKPHOS, BILITOT, PROT, ALBUMIN in the last 168 hours. No results for input(s): LIPASE, AMYLASE in the last 168 hours. No results for input(s): AMMONIA in the last 168 hours. Coagulation Profile: No results for input(s): INR, PROTIME in the last 168 hours. Cardiac Enzymes: No results for input(s): CKTOTAL, CKMB, CKMBINDEX, TROPONINI in the last 168 hours. BNP (last 3 results) No results for input(s): PROBNP in the last 8760 hours. HbA1C: No results for input(s): HGBA1C in the last 72 hours. CBG: Recent Labs  Lab 12/12/20  0110 12/12/20 0151 12/12/20 0223 12/12/20 0259  GLUCAP >600* >600* >600* 559*   Lipid Profile: No results for input(s): CHOL, HDL, LDLCALC, TRIG, CHOLHDL, LDLDIRECT in the last 72 hours. Thyroid Function Tests: No results for input(s): TSH, T4TOTAL, FREET4, T3FREE, THYROIDAB in the last 72 hours. Anemia Panel: No results for input(s): VITAMINB12, FOLATE, FERRITIN, TIBC, IRON, RETICCTPCT in the last 72 hours. Urine analysis:    Component Value Date/Time   COLORURINE YELLOW 12/12/2020 0200   APPEARANCEUR CLEAR 12/12/2020 0200   LABSPEC 1.021 12/12/2020 0200   PHURINE 5.0 12/12/2020 0200   GLUCOSEU >=500 (A) 12/12/2020 0200   HGBUR NEGATIVE 12/12/2020 0200   BILIRUBINUR NEGATIVE 12/12/2020 0200   KETONESUR NEGATIVE 12/12/2020 0200   PROTEINUR NEGATIVE 12/12/2020 0200   NITRITE NEGATIVE 12/12/2020 0200   LEUKOCYTESUR NEGATIVE 12/12/2020 0200    Radiological Exams on Admission: DG Chest 2 View  Result Date: 12/11/2020 CLINICAL DATA:  Chest pain EXAM: CHEST - 2 VIEW COMPARISON:  01/25/2019 FINDINGS: Post sternotomy changes. Fracture through first sternal wire as before. No acute airspace disease or pleural effusion. No pneumothorax. Low lung volumes. IMPRESSION:  Low lung volumes. Electronically Signed   By: Jasmine Pang M.D.   On: 12/11/2020 22:04     EKG: Independently reviewed, with result as described above.    Assessment/Plan   Eric [name] is a 60 y.o. male with medical history significant for poorly controlled type 2 diabetes mellitus with most recent hemoglobin A1c 17.6% in May 2021 and complicated by peripheral polyneuropathy, chronic diastolic heart failure, hyperlipidemia, hypertension, stage III chronic kidney disease with baseline creatinine 1.2-1.6, who is admitted to [facility] on 12/11/2020 with hyperglycemia after presenting from home to [facility] ED complaining of generalized weakness.    Principal Problem:   Hyperglycemia Active Problems:   Diabetic peripheral neuropathy associated with type 2 diabetes mellitus (HCC)   Mixed hyperlipidemia   DM (diabetes mellitus), type 2, uncontrolled w/neurologic complication (HCC)   Essential hypertension   Uncontrolled type 2 diabetes mellitus with hyperglycemia (HCC)   Frequent falls   Generalized weakness   AKI (acute kidney injury) (HCC)   Chronic diastolic CHF (congestive heart failure) (HCC)     #) Hyperglycemia: In the context of poorly controlled type 2 diabetes mellitus with most recent hemoglobin A1c of 17.6 in May 2021 in the setting of the patient's acknowledgment of suboptimal compliance with his outpatient insulin regimen, in which he reports taking his prescribed 125 units of daily basal insulin just once over the course the last 30 days, his presenting labs reflect hyperglycemia with initial blood sugar of 643, in the absence of evidence of anion gap metabolic acidosis to suggest DKA.  While significantly elevated, his presenting blood sugar does not appear to be sufficiently elevated to reach criteria for HH NK, although will Chaz Ronning assess for this possibility with serum osmolality check.  No additional organic source identified for patient's presenting hyperglycemia,  including no evidence of underlying infectious process, although nasopharyngeal COVID-19 PCR result is currently pending.  No evidence to suggest ACS, particular in the absence of any chest pain, high-sensitivity troponin I x2 which were nonelevated, and presenting EKG showing no evidence of acute ischemic changes.  Check urinary drug screen.  At this time, it appears that the patient's suboptimal compliance with his home basal insulin is the most prominent patient's hyperglycemia.  not the exclusive contributing factor leading to his hyperglycemic presentation.  In the ED this evening, he was started on insulin drip as  well as continuous Ringer's.  We will closely monitor ensuing volume status to evaluate for development of acute volume overload in the context of history of chronic diastolic heart failure.  At this time, the patient appears euvolemic to slightly hypovolemic, likely due to contribution from auto diuresis due to osmotic gradient created by patient's hyperglycemia.  He has minimal to meeting with the diabetic educator for both edification regarding glycemic management as well as to discuss the possibility of switching to a different basal insulin that may not create the reported "burning' sensation that the patient has reportedly experienced with his Toujeo.  He reports that he would be significantly more compliant with his basal insulin in the absence of his burning sensation.      Plan: Every hour Accu-Cheks.  Every 4 hours BMPs in order to monitor serum potassium level in order through 11 AM today.  As the most recent BMP was several hours ago and reflected a serum potassium level of 5.3 in the context of interval administration of IV fluids as well as insulin drip promoting decrease in blood glucose level, I will empirarlly change IVF's to the following at this time, while closely monitoring for result of STAT repeat bmp: LR with10 mEq/L KCl at 100 cc/hr. once blood sugar less than 250, will  add D5 to existing IV fluids and continue insulin drip for another 1 to 2 hours following resumption of portion of basal insulin so as to reduce risk for rebound hyperglycemia.  Monitor on telemetry.  Monitor strict I's and O's.  Check hemoglobin A1c.  An inpatient consult to the diabetic educator has been placed, at the patient's request, as above.  The importance of improved compliance with home basal insulin regimen was emphasized multiple times by me to the patient this evening.  Check serum osmolality, urine drug screen.  Follow-up result of COVID-19 PCR performed this evening.  Add on serum magnesium level.  Additionally, I have ordered every 4 hours magnesium levels through 11 AM today.  Add on serum phosphorus level.      #) Generalized weakness: the patient reports approximately 6 weeks of progressive generalized weakness in the absence of any acute focal neurologic deficits, including no evidence of acute focal weakness.  Consequently, acute versus subacute ischemic CVA is felt to be less likely at this time.  Suspect contribution from physiologic stress stemming from poor glycemic management at home, with most recent hemoglobin A1c noted to be 17.6, as further described above.  Of note, presenting urinalysis was not consistent with UTI.   Plan: work-up and management of presenting hyperglycemia, as described above. Physical therapy consult has been placed.  Check TSH, MMA, ionized calcium.  Fall precautions ordered.  Work-up and management of presenting hyperglycemia, as above.      #) frequent falls, the patient reports that he is incurred approximately 8 falls over the course of the last 6 weeks, which appear to be ground-level mechanical in nature in the absence of any associated loss of consciousness or chest pain.  While these falls do appear to be mechanical in nature, suspect potential multifactorial contributions, including contribution from generalized weakness, as further  described above, as well as predisposing factor extensive diabetic peripheral polyneuropathy.   Plan: Physical therapy consult, as above.  Recommend management of hyperglycemia, as above for precautions ordered.  Check MMA and TSH.     #) Acute Kidney Injury superimposed on stage IIIa chronic kidney disease: In the setting of baseline creatinine range of 1.2-1.26,  with most recent prior serum creatinine data point noted to be 1.24 in April 2021, presenting creatinine noted to be 2.41.  Suspect that this is prerenal in nature with contributions dehydration due to auto diuresis from osmotic gradient created by patient's poorly controlled diabetes.  Presence of hyaline casts in today's urinalysis appears consistent with underlying dehydration, as above.   Plan: monitor strict I's & O's and daily weights. Attempt to avoid nephrotoxic agents. Refrain from NSAIDs. Repeat BMP in the morning. Check serum magnesium level. Check random urine sodium and random urine creatinine.  Hold home gabapentin for now given that this medication is exclusively renally cleared.  Work-up and management of presenting hyperglycemia, IV fluids, as further described above.       #) Chronic diastolic heart failure: documented history of such, with most recent echocardiogram performed in February 2020 showing results, as further detailed.  Patient's home diuretic regimen reportedly consists of the following: Lasix 40 mg p.o. daily. No clinical evidence to suggest acutely decompensated heart failure at this time.  Rather, he appears slightly hypovolemic in the setting of dehydration stemming from auto diuresis due to hyperglycemic induced osmotic gradient, as detailed above.  Presenting EKG normal sinus rhythm without evidence of acute ischemic changes.  Will closely monitor ensuing volume status due to increased risk for development of acute volume overload in the context of ongoing management of his presenting hyperglycemia,  which includes maintenance IV fluids as well as additional IV fluids administered via concomitant insulin drip.    Plan: monitor strict I's & O's and daily weights. Repeat BMP in the morning. Check serum magnesium level.  Given perceived intravascular hypovolemia at this time, will hold home Lasix for now.  Monitor on continuous pulse oximetry.      #) Hyperlipidemia: On atorvastatin as an outpatient.  Plan: Continue home statin.      #) GERD: On omeprazole as an outpatient.  Plan: Continue on PPI.      DVT prophylaxis: SCDs Code Status: Full code Family Communication: none Disposition Plan: Per Rounding Team Consults called: none  Admission status: Observation; PCU   Of note, this patient was added by me to the following Admit List/Treatment Team:  mcadmits.     PLEASE NOTE THAT DRAGON DICTATION SOFTWARE WAS USED IN THE CONSTRUCTION OF THIS NOTE.   Angie Fava DO [facility] Pager [phone] From 7PM - 7AM   12/12/2020, 3:03 AM

## 2020-12-12 NOTE — Telephone Encounter (Signed)
Patient's wife is calling to make Dr. Royann Shivers aware the patient took his advice and is currently admitted in the hospital. She is requesting to have Dr. Royann Shivers order any testing he thinks is needed while the patient is admitted.

## 2020-12-12 NOTE — Progress Notes (Signed)
Bp 102/68 (79) Dr. Jerral Ralph notified holding scheduled lopressor, and lasix at this time.

## 2020-12-12 NOTE — Evaluation (Addendum)
Physical Therapy Evaluation Patient Details Name: Eric [name] MRN: [id number] DOB: Jan 09, 1961 Today's Date: 12/12/2020   History of Present Illness  60 yo male with history of frequent falls was admitted to hosp for elevated BS (643 on 4/12), was also noted to have sternal wire fracture.  Pt is referred to PT with his struggle with balance.  PMHx: falls, fatty liver, stroke, PN, CHF, HTN, DM, CAD, elevated A1c, CKD3,  Clinical Impression  Pt was seen for mobility on RW with help to keep chair close and maintain safety of gait.  Pt is a frequent faller, seems very familiar with the process of losing control of his legs and sitting down.  Initially reported he could walk 45' but his actual tolerance is much less.  Sits with no control of the movement, will continue to keep a chair close for safety of this pt.  Follow acutely for goals of PT below, recommend him to rehab as he is home alone with stairs to enter house.    Follow Up Recommendations SNF    Equipment Recommendations  None recommended by PT    Recommendations for Other Services       Precautions / Restrictions Precautions Precautions: Fall Precaution Comments: uses rollator at home but has SPC with him Restrictions Weight Bearing Restrictions: No Other Position/Activity Restrictions: monitor sats and HR      Mobility  Bed Mobility Overal bed mobility: Modified Independent             General bed mobility comments: needs extra time to sit up and return to supine but uses bed rail and lower HOB    Transfers Overall transfer level: Needs assistance Equipment used: Rolling walker (2 wheeled);1 person hand held assist Transfers: Sit to/from Stand Sit to Stand: Min guard         General transfer comment: min guard for safety  Ambulation/Gait Ambulation/Gait assistance: Min guard;Min assist Gait Distance (Feet): 16 Feet (13+3) Assistive device: Rolling walker (2 wheeled);1 person hand held assist Gait  Pattern/deviations: Step-through pattern;Decreased stride length;Wide base of support Gait velocity: reduced   General Gait Details: pt walked to chair, sat and rested then tried to return to bed with pt sitting very fast on chair behind him.  Seems very comfortable with this happening, not surprised at all  Stairs Stairs:  (unsafe to attempt)          Wheelchair Mobility    Modified Rankin (Stroke Patients Only)       Balance Overall balance assessment: History of Falls;Needs assistance Sitting-balance support: Feet supported Sitting balance-Leahy Scale: Fair     Standing balance support: Bilateral upper extremity supported;During functional activity Standing balance-Leahy Scale: Poor                               Pertinent Vitals/Pain Pain Assessment: Faces Faces Pain Scale: Hurts even more Pain Location: B calves with gait Pain Descriptors / Indicators: Burning Pain Intervention(s): Limited activity within patient's tolerance;Monitored during session;Repositioned    Home Living Family/patient expects to be discharged to:: Private residence Living Arrangements: Spouse/significant other Available Help at Discharge: Family;Available PRN/intermittently Type of Home: House Home Access: Stairs to enter Entrance Stairs-Rails: Can reach both Entrance Stairs-Number of Steps: 4 Home Layout: One level Home Equipment: Walker - 4 wheels;Cane - single point Additional Comments: pt is unsafe with RW as he sits very quickly without warning    Prior Function Level of Independence: Independent with  assistive device(s)         Comments: using SPC vs rollator     Hand Dominance   Dominant Hand: Right    Extremity/Trunk Assessment   Upper Extremity Assessment Upper Extremity Assessment: Overall WFL for tasks assessed    Lower Extremity Assessment Lower Extremity Assessment: RLE deficits/detail;LLE deficits/detail RLE Deficits / Details: calf pain with  gait but ankles are not weak LLE Deficits / Details: calf pain with gait but ankles are not weak    Cervical / Trunk Assessment Cervical / Trunk Assessment: Normal  Communication   Communication: No difficulties  Cognition Arousal/Alertness: Awake/alert Behavior During Therapy: Impulsive Overall Cognitive Status: No family/caregiver present to determine baseline cognitive functioning                                 General Comments: pt is having trouble with setting activity limits      General Comments General comments (skin integrity, edema, etc.): pt may have combined PN and calf pain hindering his gait, unsure if rehab is essential once BS is controlled but for now is quite unsafe to walk alone    Exercises     Assessment/Plan    PT Assessment Patient needs continued PT services  PT Problem List Decreased strength;Decreased balance;Decreased mobility;Decreased coordination;Decreased knowledge of use of DME;Decreased safety awareness;Cardiopulmonary status limiting activity;Pain;Decreased skin integrity       PT Treatment Interventions DME instruction;Gait training;Stair training;Functional mobility training;Therapeutic activities;Therapeutic exercise;Balance training;Neuromuscular re-education;Patient/family education    PT Goals (Current goals can be found in the Care Plan section)  Acute Rehab PT Goals Patient Stated Goal: to stop falling and manage leg pain PT Goal Formulation: With patient Time For Goal Achievement: 12/26/20 Potential to Achieve Goals: Good    Frequency Min 3X/week   Barriers to discharge Inaccessible home environment;Decreased caregiver support home with stairs to enter and wife is at work    Co-evaluation               AM-PAC PT "6 Clicks" Mobility  Outcome Measure Help needed turning from your back to your side while in a flat bed without using bedrails?: A Little Help needed moving from lying on your back to sitting on  the side of a flat bed without using bedrails?: A Little Help needed moving to and from a bed to a chair (including a wheelchair)?: A Little Help needed standing up from a chair using your arms (e.g., wheelchair or bedside chair)?: A Little Help needed to walk in hospital room?: A Little Help needed climbing 3-5 steps with a railing? : Total 6 Click Score: 16    End of Session   Activity Tolerance: Treatment limited secondary to medical complications (Comment);Patient limited by pain Patient left: in bed;with call bell/phone within reach;with nursing/sitter in room Nurse Communication: Mobility status PT Visit Diagnosis: Unsteadiness on feet (R26.81);Repeated falls (R29.6);Ataxic gait (R26.0);Pain Pain - Right/Left:  (both) Pain - part of body: Leg    Time: 8921-1941 PT Time Calculation (min) (ACUTE ONLY): 35 min   Charges:   PT Evaluation $PT Eval Moderate Complexity: 1 Mod PT Treatments $Gait Training: 8-22 mins       Ivar Drape 12/12/2020, 11:58 AM Samul Dada, PT MS Acute Rehab Dept. Number: [facility] [phone] and [facility] [phone]

## 2020-12-13 DIAGNOSIS — I5032 Chronic diastolic (congestive) heart failure: Secondary | ICD-10-CM | POA: Diagnosis present

## 2020-12-13 DIAGNOSIS — Z8249 Family history of ischemic heart disease and other diseases of the circulatory system: Secondary | ICD-10-CM | POA: Diagnosis not present

## 2020-12-13 DIAGNOSIS — R739 Hyperglycemia, unspecified: Secondary | ICD-10-CM | POA: Diagnosis not present

## 2020-12-13 DIAGNOSIS — R54 Age-related physical debility: Secondary | ICD-10-CM | POA: Diagnosis present

## 2020-12-13 DIAGNOSIS — Z79899 Other long term (current) drug therapy: Secondary | ICD-10-CM | POA: Diagnosis not present

## 2020-12-13 DIAGNOSIS — N179 Acute kidney failure, unspecified: Secondary | ICD-10-CM | POA: Diagnosis present

## 2020-12-13 DIAGNOSIS — I951 Orthostatic hypotension: Secondary | ICD-10-CM | POA: Diagnosis present

## 2020-12-13 DIAGNOSIS — E1165 Type 2 diabetes mellitus with hyperglycemia: Secondary | ICD-10-CM | POA: Diagnosis present

## 2020-12-13 DIAGNOSIS — Z951 Presence of aortocoronary bypass graft: Secondary | ICD-10-CM | POA: Diagnosis not present

## 2020-12-13 DIAGNOSIS — E1122 Type 2 diabetes mellitus with diabetic chronic kidney disease: Secondary | ICD-10-CM | POA: Diagnosis present

## 2020-12-13 DIAGNOSIS — K760 Fatty (change of) liver, not elsewhere classified: Secondary | ICD-10-CM | POA: Diagnosis present

## 2020-12-13 DIAGNOSIS — Z794 Long term (current) use of insulin: Secondary | ICD-10-CM | POA: Diagnosis not present

## 2020-12-13 DIAGNOSIS — I130 Hypertensive heart and chronic kidney disease with heart failure and stage 1 through stage 4 chronic kidney disease, or unspecified chronic kidney disease: Secondary | ICD-10-CM | POA: Diagnosis present

## 2020-12-13 DIAGNOSIS — N1831 Chronic kidney disease, stage 3a: Secondary | ICD-10-CM | POA: Diagnosis present

## 2020-12-13 DIAGNOSIS — R296 Repeated falls: Secondary | ICD-10-CM | POA: Diagnosis present

## 2020-12-13 DIAGNOSIS — E1142 Type 2 diabetes mellitus with diabetic polyneuropathy: Secondary | ICD-10-CM | POA: Diagnosis present

## 2020-12-13 DIAGNOSIS — I2510 Atherosclerotic heart disease of native coronary artery without angina pectoris: Secondary | ICD-10-CM | POA: Diagnosis present

## 2020-12-13 DIAGNOSIS — Z20822 Contact with and (suspected) exposure to covid-19: Secondary | ICD-10-CM | POA: Diagnosis present

## 2020-12-13 DIAGNOSIS — R531 Weakness: Secondary | ICD-10-CM | POA: Diagnosis present

## 2020-12-13 DIAGNOSIS — Z7984 Long term (current) use of oral hypoglycemic drugs: Secondary | ICD-10-CM | POA: Diagnosis not present

## 2020-12-13 DIAGNOSIS — E861 Hypovolemia: Secondary | ICD-10-CM | POA: Diagnosis present

## 2020-12-13 DIAGNOSIS — Z8673 Personal history of transient ischemic attack (TIA), and cerebral infarction without residual deficits: Secondary | ICD-10-CM | POA: Diagnosis not present

## 2020-12-13 DIAGNOSIS — F17220 Nicotine dependence, chewing tobacco, uncomplicated: Secondary | ICD-10-CM | POA: Diagnosis present

## 2020-12-13 DIAGNOSIS — K219 Gastro-esophageal reflux disease without esophagitis: Secondary | ICD-10-CM | POA: Diagnosis present

## 2020-12-13 DIAGNOSIS — E782 Mixed hyperlipidemia: Secondary | ICD-10-CM | POA: Diagnosis present

## 2020-12-13 LAB — BASIC METABOLIC PANEL
Anion gap: 7 (ref 5–15)
BUN: 34 mg/dL — ABNORMAL HIGH (ref 6–20)
CO2: 26 mmol/L (ref 22–32)
Calcium: 8.9 mg/dL (ref 8.9–10.3)
Chloride: 98 mmol/L (ref 98–111)
Creatinine, Ser: 1.55 mg/dL — ABNORMAL HIGH (ref 0.61–1.24)
GFR, Estimated: 51 mL/min — ABNORMAL LOW (ref >60)
Glucose, Bld: 259 mg/dL — ABNORMAL HIGH (ref 70–99)
Potassium: 4.2 mmol/L (ref 3.5–5.1)
Sodium: 131 mmol/L — ABNORMAL LOW (ref 135–145)

## 2020-12-13 LAB — GLUCOSE, CAPILLARY
Glucose-Capillary: 286 mg/dL — ABNORMAL HIGH (ref 70–99)
Glucose-Capillary: 322 mg/dL — ABNORMAL HIGH (ref 70–99)
Glucose-Capillary: 191 mg/dL — ABNORMAL HIGH (ref 70–99)
Glucose-Capillary: 266 mg/dL — ABNORMAL HIGH (ref 70–99)
Glucose-Capillary: 275 mg/dL — ABNORMAL HIGH (ref 70–99)

## 2020-12-13 LAB — HEMOGLOBIN A1C
Hgb A1c MFr Bld: >15.5 % — ABNORMAL HIGH (ref 4.8–5.6)
Mean Plasma Glucose: >398 mg/dL

## 2020-12-13 MED ORDER — INSULIN ASPART 100 UNIT/ML ~~LOC~~ SOLN
45.0000 [IU] | Freq: Three times a day (TID) | SUBCUTANEOUS | Status: DC
  Administered 2020-12-13 – 2020-12-14 (×5): 45 [IU] via SUBCUTANEOUS

## 2020-12-13 MED ORDER — INSULIN GLARGINE 100 UNIT/ML ~~LOC~~ SOLN
60.0000 [IU] | Freq: Two times a day (BID) | SUBCUTANEOUS | Status: DC
  Administered 2020-12-13 (×2): 60 [IU] via SUBCUTANEOUS
  Filled 2020-12-13 (×4): qty 0.6

## 2020-12-13 NOTE — Progress Notes (Signed)
Physical Therapy Treatment Patient Details Name: Eric [name] MRN: [id number] DOB: 1961/06/25 Today's Date: 12/13/2020    History of Present Illness 61 yo male with history of frequent falls was admitted to hosp for elevated BS (643 on 4/12), was also noted to have sternal wire fracture.  PMHx: falls, fatty liver, stroke, PN, CHF, HTN, DM, CAD, elevated A1c, CKD3,    PT Comments    Pt making progress with mobility but remains at high risk for repeated falls. Continue to recommend ST-SNF for further rehab.    Follow Up Recommendations  SNF     Equipment Recommendations  None recommended by PT    Recommendations for Other Services       Precautions / Restrictions Precautions Precautions: Fall    Mobility  Bed Mobility               General bed mobility comments: Pt standing with nurse tech    Transfers Overall transfer level: Needs assistance Equipment used: Rolling walker (2 wheeled);1 person hand held assist Transfers: Sit to/from Stand Sit to Stand: Min guard         General transfer comment: Assist for safety and lines  Ambulation/Gait Ambulation/Gait assistance: Min assist Gait Distance (Feet): 80 Feet Assistive device: Rolling walker (2 wheeled);1 person hand held assist Gait Pattern/deviations: Step-through pattern;Decreased stride length;Wide base of support Gait velocity: decr Gait velocity interpretation: 1.31 - 2.62 ft/sec, indicative of limited community ambulator General Gait Details: Assist for balance. When going to bathroom pt rushing with decr awareness of safety   Stairs             Wheelchair Mobility    Modified Rankin (Stroke Patients Only)       Balance Overall balance assessment: History of Falls;Needs assistance Sitting-balance support: Feet supported Sitting balance-Leahy Scale: Fair     Standing balance support: Bilateral upper extremity supported;During functional activity Standing balance-Leahy Scale:  Poor Standing balance comment: UE support and min guard for static standing                            Cognition Arousal/Alertness: Awake/alert Behavior During Therapy: Impulsive Overall Cognitive Status: No family/caregiver present to determine baseline cognitive functioning Area of Impairment: Safety/judgement                         Safety/Judgement: Decreased awareness of safety;Decreased awareness of deficits            Exercises      General Comments        Pertinent Vitals/Pain Pain Assessment: No/denies pain    Home Living                      Prior Function            PT Goals (current goals can now be found in the care plan section) Acute Rehab PT Goals Patient Stated Goal: to stop falling and manage leg pain Progress towards PT goals: Progressing toward goals    Frequency    Min 2X/week      PT Plan Current plan remains appropriate;Frequency needs to be updated    Co-evaluation              AM-PAC PT "6 Clicks" Mobility   Outcome Measure  Help needed turning from your back to your side while in a flat bed without using bedrails?: A Little Help needed moving from  lying on your back to sitting on the side of a flat bed without using bedrails?: A Little Help needed moving to and from a bed to a chair (including a wheelchair)?: A Little Help needed standing up from a chair using your arms (e.g., wheelchair or bedside chair)?: A Little Help needed to walk in hospital room?: A Little Help needed climbing 3-5 steps with a railing? : Total 6 Click Score: 16    End of Session Equipment Utilized During Treatment: Gait belt Activity Tolerance: Patient tolerated treatment well Patient left: with call bell/phone within reach;in chair;with chair alarm set Nurse Communication: Mobility status;Other (comment) (Pt had a BM) PT Visit Diagnosis: Unsteadiness on feet (R26.81);Repeated falls (R29.6)     Time:  0938-1000 PT Time Calculation (min) (ACUTE ONLY): 22 min  Charges:  $Gait Training: 8-22 mins                     [facility] PT Acute Rehabilitation Services Pager [phone] Office [phone]    Eric [name] [facility] 12/13/2020, 11:21 AM

## 2020-12-13 NOTE — Progress Notes (Addendum)
Initial Nutrition Assessment  DOCUMENTATION CODES:  Not applicable  INTERVENTION:   Continue current diet as ordered  Continue to educate and reinforce compliancy with medications and diet for chronic health conditions  Consider glucerna 1x/d if intake declines to provide 220kcal and 10g of protein per serving  NUTRITION DIAGNOSIS:  Unintentional weight loss related to other (see comment) (poor glycemic managment) as evidenced by other (comment) (HgbA1c of 17.6%).  GOAL:  Patient will meet greater than or equal to 90% of their needs  MONITOR:  PO intake  REASON FOR ASSESSMENT:  Malnutrition Screening Tool    ASSESSMENT:  Pt admitted with weakness and falls. Found to have extreme hyperglycermia. Noncompliant with DM regimen and most recent A1c 17.6%. PMH relevant for CHF, HLD, HTN, CKD3, T2DM  Pt resting in bedside chair at the time of visit. States appetite has been great during admission (100% of meals noted to be documented). Pt reports at home, he usually eats [place] for breakfast (country ham biscuit and hashbrowns), a payday candy bar as a snack, and then he and his wife will eat out for dinner (chinese buffet, japanese, burgers, etc). May also have a can of soup or chef boyardee at night before bed. Discussed weight loss and the correlation between his most recent A1c. Talked to pt about why weight is lost when glucose is not controlled and the importance of getting glucose into the cells the provide energy for our body. Pt states he does not take his long acting insulin because it burns when it is inserted. When inquiring about short acting insulin at meals pt reports that he does not routinely take this either as it "doesn't work". DM educator met with pt yesterday and talked extensively about medication compliance. Noted PCP has also discussed with pt in the past. Plans to adjust regimen on dc. Pt reports significant weight loss, noted 87.1kg in January at PCP visit (2.6%  weight loss x 3 months, not severe)  Nutritionally relevant Meds: . atorvastatin  80 mg Oral QHS  . furosemide  40 mg Oral Daily  . insulin aspart  0-15 Units Subcutaneous TID WC  . insulin aspart  0-5 Units Subcutaneous QHS  . insulin aspart  45 Units Subcutaneous TID WC  . insulin glargine  60 Units Subcutaneous BID  . pantoprazole  40 mg Oral Daily  . spironolactone  50 mg Oral Daily   Labs reviewed:   Na 131  BUN 34 / creatinine 1.55  SBG ranges 259-$RemoveBefore'430mg'cbuZCiFWXrolV$ /dL over the last 24 hours  HgbA1c >15.5% on 4/13 (noted 01/21/20 17.6%)  NUTRITION - FOCUSED PHYSICAL EXAM: Flowsheet Row Most Recent Value  Orbital Region No depletion  Upper Arm Region Mild depletion  Thoracic and Lumbar Region No depletion  Buccal Region No depletion  Temple Region No depletion  Clavicle Bone Region No depletion  Clavicle and Acromion Bone Region No depletion  Scapular Bone Region No depletion  Dorsal Hand No depletion  Patellar Region No depletion  Anterior Thigh Region No depletion  Posterior Calf Region No depletion  Edema (RD Assessment) None  Hair Reviewed  Eyes Reviewed  Skin Reviewed  Nails Reviewed  [broken and picked at. bloody nail beds]     Diet Order:   Diet Order            Diet Carb Modified Fluid consistency: Thin; Room service appropriate? Yes  Diet effective now                EDUCATION NEEDS:  No education needs have been identified at this time  Skin:  Skin Assessment: Reviewed RN Assessment  Last BM:  4/14 per RN documentation  Height:  Ht Readings from Last 1 Encounters:  12/11/20 $RemoveB'5\' 6"'ToZRuLcu$  (1.676 m)    Weight:  Wt Readings from Last 1 Encounters:  12/13/20 84.8 kg    Ideal Body Weight:  64.5 kg  BMI:  Body mass index is 30.17 kg/m.  Estimated Nutritional Needs:   Kcal:  1800-2000 kcal  Protein:  90-100  Fluid:  >1.8L/d   Ranell Patrick, RD, LDN Clinical Dietitian Pager on [place]

## 2020-12-13 NOTE — Progress Notes (Signed)
PROGRESS NOTE    Eric [name]  YSA:[id number] DOB: 1960/11/06 DOA: 12/11/2020 PCP: Rudi Coco, MD    Brief Narrative:  60 year old gentleman with history of poorly controlled type 2 diabetes with known A1c of more than 15, severe peripheral polyneuropathy, chronic diastolic heart failure, hyperlipidemia, hypertension, stage IIIa chronic kidney disease presented to the hospital with hyperglycemia, generalized weakness.  Patient is stated that recently he has been progressively weak and having difficulty walking.  Blood sugars more than 300 at home.  Using short acting insulin but not using long-acting insulin due to burning on the skin. Hemodynamically stable in the emergency room.  Creatinine 2.4.  Blood sugar 643 with no anion gap.  Admitted with hyperglycemic crisis and AKI.  Assessment & Plan:   Principal Problem:   Hyperglycemia Active Problems:   Diabetic peripheral neuropathy associated with type 2 diabetes mellitus (HCC)   Mixed hyperlipidemia   DM (diabetes mellitus), type 2, uncontrolled w/neurologic complication (HCC)   Essential hypertension   Uncontrolled type 2 diabetes mellitus with hyperglycemia (HCC)   Frequent falls   Generalized weakness   AKI (acute kidney injury) (HCC)   Chronic diastolic CHF (congestive heart failure) (HCC)  Insulin-dependent type 2 diabetes with hyperglycemia, uncontrolled blood sugars: Due to inadequate treatment at home.  Initially treated with insulin infusion and now transition to subcu insulin. Patient on Toujeo 125 units at night and NovoLog 45 units with mealtime prescribed, however not taking Toujeo because of local burning sensation.  Repeat A1c more than 15.5. Started on Lantus, increase dose to Lantus 60 units twice a day, continue NovoLog 45 units with meal and sliding scale insulin.  We will continue to uptitrate.  Diabetic diet.  Seen by diabetic educator. Patient agreeable to use current regimen.  Generalized debility  due to uncontrolled blood sugars and peripheral neuropathy and frequent falls: Very deconditioned.  Work with PT OT.  CT head with no acute findings.  No focal neurological deficits.  Refer to skilled nursing rehab for short-term rehab before returning home.  Acute kidney injury superimposed on chronic kidney disease stage IIIa: Baseline creatinine 1.26 on recent visit.  Presented with creatinine 2.41.  Treated with IV fluids and levels are already improving.  Discontinue IV fluid today.  Encourage oral intake.  Chronic diastolic heart failure: Euvolemic.  Beta-blockers with holding parameters.  Resume Aldactone and Lasix today.  Hyperlipidemia: On statin continue.  GERD: On PPI continue.   DVT prophylaxis: SCDs Start: 12/12/20 0308   Code Status: Full code Family Communication: None.  Patient communicating. Disposition Plan: Status is: Observation  The patient will require care spanning > 2 midnights and should be moved to inpatient because: Unsafe d/c plan  Dispo: The patient is from: Home              Anticipated d/c is to: SNF              Patient currently is medically stable to d/c.   Difficult to place patient No         Consultants:   None  Procedures:   None  Antimicrobials:   None   Subjective: Patient seen and examined.  Feels extremely weak otherwise no complaints.  Denies any chest pain or shortness of breath.  Denies any dizziness on standing up, however some of his blood pressures were low.  Objective: Vitals:   12/13/20 0400 12/13/20 0433 12/13/20 0831 12/13/20 0836  BP: (!) 88/70   98/77  Pulse: 77   77  Resp: 16     Temp: 98 F (36.7 C)     TempSrc: Oral     SpO2: 91%  94%   Weight:  84.8 kg    Height:        Intake/Output Summary (Last 24 hours) at 12/13/2020 1311 Last data filed at 12/13/2020 0215 Gross per 24 hour  Intake 1807.32 ml  Output --  Net 1807.32 ml   Filed Weights   12/11/20 2136 12/13/20 0433  Weight: 80.3 kg 84.8  kg    Examination:  General exam: Appears calm and comfortable  Frail and debilitated.  Not in any distress. Respiratory system: Clear to auscultation. Respiratory effort normal. Cardiovascular system: S1 & S2 heard, RRR. No JVD, murmurs, rubs, gallops or clicks. No pedal edema. Gastrointestinal system: Abdomen is nondistended, soft and nontender. No organomegaly or masses felt. Normal bowel sounds heard. Central nervous system: Alert and oriented. No focal neurological deficits. Extremities: Symmetric 5 x 5 power. Skin: No rashes, lesions or ulcers Psychiatry: Judgement and insight appear normal. Mood & affect appropriate.     Data Reviewed: I have personally reviewed following labs and imaging studies  CBC: Recent Labs  Lab 12/11/20 2139  WBC 9.1  HGB 15.7  HCT 44.4  MCV 85.4  PLT 196   Basic Metabolic Panel: Recent Labs  Lab 12/11/20 2139 12/12/20 0420 12/12/20 0700 12/12/20 1112 12/13/20 0027  NA 123* 127* 130* 130* 131*  K 5.3* 3.9 4.0 4.0 4.2  CL 86* 92* 91* 95* 98  CO2 23 23 26 23 26   GLUCOSE 643* 430* 287* 268* 259*  BUN 57* 49* 49* 43* 34*  CREATININE 2.41* 1.93* 1.77* 1.68* 1.55*  CALCIUM 9.3 9.0 9.5 9.2 8.9  MG  --  2.5* 2.6* 2.3  --   PHOS  --   --  4.5  --   --    GFR: Estimated Creatinine Clearance: 52.4 mL/min (A) (by C-G formula based on SCr of 1.55 mg/dL (H)). Liver Function Tests: No results for input(s): AST, ALT, ALKPHOS, BILITOT, PROT, ALBUMIN in the last 168 hours. No results for input(s): LIPASE, AMYLASE in the last 168 hours. No results for input(s): AMMONIA in the last 168 hours. Coagulation Profile: No results for input(s): INR, PROTIME in the last 168 hours. Cardiac Enzymes: No results for input(s): CKTOTAL, CKMB, CKMBINDEX, TROPONINI in the last 168 hours. BNP (last 3 results) No results for input(s): PROBNP in the last 8760 hours. HbA1C: Recent Labs    12/12/20 0747  HGBA1C >15.5*   CBG: Recent Labs  Lab 12/12/20 1227  12/12/20 1807 12/12/20 1957 12/13/20 0816 12/13/20 1206  GLUCAP 242* 241* 313* 286* 322*   Lipid Profile: No results for input(s): CHOL, HDL, LDLCALC, TRIG, CHOLHDL, LDLDIRECT in the last 72 hours. Thyroid Function Tests: Recent Labs    12/12/20 0420  TSH 1.740   Anemia Panel: No results for input(s): VITAMINB12, FOLATE, FERRITIN, TIBC, IRON, RETICCTPCT in the last 72 hours. Sepsis Labs: No results for input(s): PROCALCITON, LATICACIDVEN in the last 168 hours.  Recent Results (from the past 240 hour(s))  SARS CORONAVIRUS 2 (TAT 6-24 HRS) Nasopharyngeal Nasopharyngeal Swab     Status: None   Collection Time: 12/12/20 12:36 AM   Specimen: Nasopharyngeal Swab  Result Value Ref Range Status   SARS Coronavirus 2 NEGATIVE NEGATIVE Final    Comment: (NOTE) SARS-CoV-2 target nucleic acids are NOT DETECTED.  The SARS-CoV-2 RNA is generally detectable in upper and lower respiratory specimens during the acute phase  of infection. Negative results do not preclude SARS-CoV-2 infection, do not rule out co-infections with other pathogens, and should not be used as the sole basis for treatment or other patient management decisions. Negative results must be combined with clinical observations, patient history, and epidemiological information. The expected result is Negative.  Fact Sheet for Patients: HairSlick.no  Fact Sheet for Healthcare Providers: quierodirigir.com  This test is not yet approved or cleared by the [place] FDA and  has been authorized for detection and/or diagnosis of SARS-CoV-2 by FDA under an Emergency Use Authorization (EUA). This EUA will remain  in effect (meaning this test can be used) for the duration of the COVID-19 declaration under Se ction 564(b)(1) of the Act, 21 U.S.C. section 360bbb-3(b)(1), unless the authorization is terminated or revoked sooner.  Performed at [facility], [address], [place], [place] [address]   MRSA PCR Screening     Status: Abnormal   Collection Time: 12/12/20 11:10 AM   Specimen: Nasal Mucosa; Nasopharyngeal  Result Value Ref Range Status   MRSA by PCR POSITIVE (A) NEGATIVE Final    Comment:        The GeneXpert MRSA Assay (FDA approved for NASAL specimens only), is one component of a comprehensive MRSA colonization surveillance program. It is not intended to diagnose MRSA infection nor to guide or monitor treatment for MRSA infections. CRITICAL RESULT CALLED TO, READ BACK BY AND VERIFIED WITH: RN ERIN ROWLETT BY KJ ON 12/12/20 AT 1350 Performed at [facility], [address], [place], [place] [address]          Radiology Studies: DG Chest 2 View  Result Date: 12/11/2020 CLINICAL DATA:  Chest pain EXAM: CHEST - 2 VIEW COMPARISON:  01/25/2019 FINDINGS: Post sternotomy changes. Fracture through first sternal wire as before. No acute airspace disease or pleural effusion. No pneumothorax. Low lung volumes. IMPRESSION: Low lung volumes. Electronically Signed   By: Jasmine Pang M.D.   On: 12/11/2020 22:04   CT HEAD WO CONTRAST  Result Date: 12/12/2020 CLINICAL DATA:  Increasing leg weakness with multiple falls, initial encounter EXAM: CT HEAD WITHOUT CONTRAST TECHNIQUE: Contiguous axial images were obtained from the base of the skull through the vertex without intravenous contrast. COMPARISON:  None. FINDINGS: Brain: No evidence of acute infarction, hemorrhage, hydrocephalus, extra-axial collection or mass lesion/mass effect. Vascular: No hyperdense vessel or unexpected calcification. Skull: Normal. Negative for fracture or focal lesion. Sinuses/Orbits: No acute finding. Other: None. IMPRESSION: No acute intracranial abnormality noted. Electronically Signed   By: Alcide Clever M.D.   On: 12/12/2020 08:40        Scheduled Meds: . allopurinol  300 mg Oral Daily  . aspirin EC  81 mg Oral Daily  . atorvastatin  80 mg Oral QHS  .  Chlorhexidine Gluconate Cloth  6 each Topical Q0600  . furosemide  40 mg Oral Daily  . gabapentin  800 mg Oral TID  . insulin aspart  0-15 Units Subcutaneous TID WC  . insulin aspart  0-5 Units Subcutaneous QHS  . insulin aspart  45 Units Subcutaneous TID WC  . insulin glargine  60 Units Subcutaneous BID  . metoprolol tartrate  12.5 mg Oral BID  . mupirocin ointment  1 application Nasal BID  . pantoprazole  40 mg Oral Daily  . spironolactone  50 mg Oral Daily   Continuous Infusions:   LOS: 0 days    Time spent: 34 minutes    Dorcas Carrow, MD  [facility] Pager [phone]

## 2020-12-13 NOTE — TOC Progression Note (Addendum)
Transition of Care [facility]) - Progression Note    Patient Details  Name: Eric [name] MRN: [id number] Date of Birth: [birth year]  Transition of [facility]) CM/SW Contact  Mearl Latin, LCSW Phone Number: 12/13/2020, 3:19 PM  Clinical Narrative:    3:19pm-CSW sent referral to [facility] [place]. CSW left voicemail for the [employer], [employer], and [employer]. [employer] is private pay only. Still no response from [facility] or [facility].  CSW spoke with [facility] in [place] and they can accept patient pending insurance approval. CSW left voicemail for patient's spouse to provide the SNF options that are available as patient will have to pick from the SNFs that have responded. Insurance auth still pending.   3:33pm- CSW received return call from [facility] [place] and they are able to accept patient.   3:39pm-CSW received return call from patient's spouse. She is declining bed at [facility] and continues to request [place]. CSW explained that none of the [facility] facilities have contacted CSW back except for [facility] and we need to pick a facility that had accepted patient. She stated understanding and reported she will call CSW back.   4:15pm-Spouse called CSW back and stated she would be willing to send patient to [facility] or [facility].  However, she is outside of [place] and going inside to see if she can reach someone.   4:30pm-Spouse called back and stated the receptionist told her admissions was out at a conference today but will call CSW back in the morning. CSW will await call; insurance auth still pending.    Expected Discharge Plan: Skilled Nursing Facility Barriers to Discharge: Continued Medical Work up  Expected Discharge Plan and Services Expected Discharge Plan: Skilled Nursing Facility In-house Referral: Clinical Social Work Discharge Planning Services: CM Consult Post Acute Care Choice: Skilled Nursing Facility Living  arrangements for the past 2 months: Single Family Home                                       Social Determinants of Health (SDOH) Interventions    Readmission Risk Interventions Readmission Risk Prevention Plan 11/18/2018  Transportation Screening Complete  PCP or Specialist Appt within 5-7 Days Not Complete  Home Care Screening Complete  Medication Review (RN CM) Complete

## 2020-12-13 NOTE — Progress Notes (Signed)
CSW faxed clinicals to [facility] and [facility], insurance authorization process started for [name]. However, patient and his wife no longer want [facility].

## 2020-12-13 NOTE — NC FL2 (Signed)
[place] MEDICAID FL2 LEVEL OF CARE SCREENING TOOL     IDENTIFICATION  Patient Name: Eric [name] Birthdate: 1961/03/31 Sex: male Admission Date (Current Location): 12/11/2020  [facility] and [place] Number:  [employer] and Address:  [facility] [place] [facility], [address], [place], [place] [address]      Provider Number: [id number]  Attending Physician Name and Address:  Dorcas Carrow, MD  Relative Name and Phone Number:  Luanne Bras (spouse) [phone]    Current Level of Care: Hospital Recommended Level of Care: Skilled Nursing Facility Prior Approval Number:    Date Approved/Denied:   PASRR Number: [id number]  Discharge Plan: SNF    Current Diagnoses: Patient Active Problem List   Diagnosis Date Noted  . Hyperglycemia 12/12/2020  . Frequent falls 12/12/2020  . Generalized weakness 12/12/2020  . AKI (acute kidney injury) (HCC) 12/12/2020  . Chronic diastolic CHF (congestive heart failure) (HCC) 12/12/2020  . Restrictive lung disease 12/22/2019  . Atypical angina (HCC)   . DOE (dyspnea on exertion) 05/04/2019  . PAF- transient post CABG 05/04/2019  . Coronary artery disease involving autologous vein coronary bypass graft without angina pectoris 11/10/2018  . Hx of CABG 11/09/2018  . Abnormal CT scan, heart   . Mild obesity 11/06/2018  . Uncontrolled type 2 diabetes mellitus with hyperglycemia (HCC) 11/06/2018  . Acute on chronic diastolic (congestive) heart failure (HCC) 11/06/2018  . Mixed hyperlipidemia 10/10/2018  . DM (diabetes mellitus), type 2, uncontrolled w/neurologic complication (HCC) 10/10/2018  . History of transient ischemic attack (TIA) 10/10/2018  . OSA (obstructive sleep apnea) 10/10/2018  . Essential hypertension 10/10/2018  . Chest pain 05/16/2018  . Type 2 diabetes mellitus with hyperlipidemia (HCC) 05/16/2018  . Vitamin D deficiency 11/02/2015  . Obesity (BMI 30-39.9) 04/17/2014  . Reflux gastritis 04/17/2014  . Hiatal  hernia without gangrene and obstruction 04/17/2014  . Plantar fasciitis, left 03/02/2014  . Diabetic peripheral neuropathy associated with type 2 diabetes mellitus (HCC) 11/22/2013  . Epididymitis 02/04/2012  . UTI (urinary tract infection) 02/04/2012  . DDD (degenerative disc disease), cervical 10/24/2010  . Gout, unspecified 10/24/2010  . OA (osteoarthritis) 10/24/2010  . Allergic rhinitis 03/04/2007  . Asthma 03/04/2007    Orientation RESPIRATION BLADDER Height & Weight     Self,Time,Situation  Normal Continent Weight: 186 lb 15.2 oz (84.8 kg) Height:  5\' 6"  (167.6 cm)  BEHAVIORAL SYMPTOMS/MOOD NEUROLOGICAL BOWEL NUTRITION STATUS      Continent Diet (See DC summary)  AMBULATORY STATUS COMMUNICATION OF NEEDS Skin   Limited Assist Verbally Normal                       Personal Care Assistance Level of Assistance  Bathing,Feeding,Dressing Bathing Assistance: Limited assistance Feeding assistance: Independent Dressing Assistance: Limited assistance     Functional Limitations Info  Sight,Hearing,Speech Sight Info: Impaired Hearing Info: Impaired Speech Info: Adequate    SPECIAL CARE FACTORS FREQUENCY  PT (By licensed PT),OT (By licensed OT)     PT Frequency: 5X per week OT Frequency: 5X per week            Contractures Contractures Info: Not present    Additional Factors Info  Code Status,Allergies,Insulin Sliding Scale Code Status Info: FULL Allergies Info: Bee Venom, Gold-containing Drug Products, Latex, Lisinopril   Insulin Sliding Scale Info: See DC summary       Current Medications (12/13/2020):  This is the current hospital active medication list Current Facility-Administered Medications  Medication Dose Route  Frequency Provider Last Rate Last Admin  . acetaminophen (TYLENOL) tablet 650 mg  650 mg Oral Q6H PRN Howerter, Justin B, DO       Or  . acetaminophen (TYLENOL) suppository 650 mg  650 mg Rectal Q6H PRN Howerter, Justin B, DO      .  allopurinol (ZYLOPRIM) tablet 300 mg  300 mg Oral Daily Dorcas Carrow, MD   300 mg at 12/13/20 0837  . aspirin EC tablet 81 mg  81 mg Oral Daily Dorcas Carrow, MD   81 mg at 12/13/20 0837  . atorvastatin (LIPITOR) tablet 80 mg  80 mg Oral QHS Howerter, Justin B, DO   80 mg at 12/12/20 2105  . Chlorhexidine Gluconate Cloth 2 % PADS 6 each  6 each Topical Q0600 Dorcas Carrow, MD   6 each at 12/13/20 0837  . furosemide (LASIX) tablet 40 mg  40 mg Oral Daily Dorcas Carrow, MD   40 mg at 12/13/20 0837  . gabapentin (NEURONTIN) capsule 800 mg  800 mg Oral TID Dorcas Carrow, MD   800 mg at 12/13/20 0836  . HYDROcodone-acetaminophen (NORCO/VICODIN) 5-325 MG per tablet 1 tablet  1 tablet Oral BID PRN Dorcas Carrow, MD      . insulin aspart (novoLOG) injection 0-15 Units  0-15 Units Subcutaneous TID WC Dorcas Carrow, MD   8 Units at 12/13/20 0924  . insulin aspart (novoLOG) injection 0-5 Units  0-5 Units Subcutaneous QHS Dorcas Carrow, MD   4 Units at 12/12/20 2105  . insulin aspart (novoLOG) injection 45 Units  45 Units Subcutaneous TID WC Dorcas Carrow, MD   45 Units at 12/13/20 0924  . insulin glargine (LANTUS) injection 60 Units  60 Units Subcutaneous BID Dorcas Carrow, MD   60 Units at 12/13/20 0925  . metoprolol tartrate (LOPRESSOR) tablet 12.5 mg  12.5 mg Oral BID Dorcas Carrow, MD   12.5 mg at 12/13/20 0836  . mupirocin ointment (BACTROBAN) 2 % 1 application  1 application Nasal BID Dorcas Carrow, MD   1 application at 12/13/20 [phone]  . pantoprazole (PROTONIX) EC tablet 40 mg  40 mg Oral Daily Howerter, Justin B, DO   40 mg at 12/13/20 0836  . spironolactone (ALDACTONE) tablet 50 mg  50 mg Oral Daily Dorcas Carrow, MD   50 mg at 12/13/20 0837  . tiZANidine (ZANAFLEX) tablet 4 mg  4 mg Oral QHS PRN Dorcas Carrow, MD         Discharge Medications: Please see discharge summary for a list of discharge medications.  Relevant Imaging Results:  Relevant Lab Results:   Additional  Information SSN: [id number] Not vaccinated for COVID-19  Mearl Latin, LCSW

## 2020-12-13 NOTE — Care Management Obs Status (Signed)
MEDICARE OBSERVATION STATUS NOTIFICATION   Patient Details  Name: Eric [name] MRN: [id number] Date of Birth: 03/31/1961   Medicare Observation Status Notification Given:  Yes    Mearl Latin, LCSW 12/13/2020, 11:12 AM

## 2020-12-13 NOTE — TOC Initial Note (Signed)
Transition of Care [facility]) - Initial/Assessment Note    Patient Details  Name: Eric [name] MRN: [id number] Date of Birth: Oct 12, 1960  Transition of Care [facility]) CM/SW Contact:    [place] Pink, Student-Social Work Phone Number: 12/13/2020, 10:52 AM  Clinical Narrative:                 CSW received consult for possible SNF placement at time of discharge. CSW spoke with patient and his wife with whom the patient had on speaker phone. Patient and wife expressed understanding of PT recommendation and are agreeable to SNF placement at time of discharge. Patient reports preference for [facility] or [place]. Patient has not received the COVID vaccines. Patient expressed being hopeful for rehab and to feel better soon. No further questions reported at this time.    Expected Discharge Plan: Skilled Nursing Facility Barriers to Discharge: Continued Medical Work up   Patient Goals and CMS Choice Patient states their goals for this hospitalization and ongoing recovery are:: To feel better   Choice offered to / list presented to : Patient  Expected Discharge Plan and Services Expected Discharge Plan: Skilled Nursing Facility In-house Referral: Clinical Social Work Discharge Planning Services: CM Consult Post Acute Care Choice: Skilled Nursing Facility Living arrangements for the past 2 months: Single Family Home                                      Prior Living Arrangements/Services Living arrangements for the past 2 months: Single Family Home Lives with:: Self Patient language and need for interpreter reviewed:: Yes Do you feel safe going back to the place where you live?: Yes      Need for Family Participation in Patient Care: No (Comment) Care giver support system in place?: Yes (comment)   Criminal Activity/Legal Involvement Pertinent to Current Situation/Hospitalization: No - Comment as needed  Activities of Daily Living Home Assistive Devices/Equipment: Cane (specify  quad or straight) ADL Screening (condition at time of admission) Patient's cognitive ability adequate to safely complete daily activities?: Yes Is the patient deaf or have difficulty hearing?: Yes Does the patient have difficulty seeing, even when wearing glasses/contacts?: No Does the patient have difficulty concentrating, remembering, or making decisions?: Yes Patient able to express need for assistance with ADLs?: Yes Does the patient have difficulty dressing or bathing?: No Independently performs ADLs?: Yes (appropriate for developmental age) Does the patient have difficulty walking or climbing stairs?: Yes Weakness of Legs: Both Weakness of Arms/Hands: Both  Permission Sought/Granted Permission sought to share information with : Facility Product manager granted to share information with : Yes, Verbal Permission Granted     Permission granted to share info w AGENCY: SNF's        Emotional Assessment Appearance:: Appears stated age Attitude/Demeanor/Rapport: Engaged Affect (typically observed): Accepting,Appropriate Orientation: : Oriented to Self,Oriented to Place,Oriented to  Time,Oriented to Situation Alcohol / Substance Use: Not Applicable Psych Involvement: No (comment)  Admission diagnosis:  Weakness [R53.1] Hyperglycemia [R73.9] AKI (acute kidney injury) (HCC) [N17.9] Patient Active Problem List   Diagnosis Date Noted  . Hyperglycemia 12/12/2020  . Frequent falls 12/12/2020  . Generalized weakness 12/12/2020  . AKI (acute kidney injury) (HCC) 12/12/2020  . Chronic diastolic CHF (congestive heart failure) (HCC) 12/12/2020  . Restrictive lung disease 12/22/2019  . Atypical angina (HCC)   . DOE (dyspnea on exertion) 05/04/2019  . PAF- transient post  CABG 05/04/2019  . Coronary artery disease involving autologous vein coronary bypass graft without angina pectoris 11/10/2018  . Hx of CABG 11/09/2018  . Abnormal CT scan, heart   . Mild  obesity 11/06/2018  . Uncontrolled type 2 diabetes mellitus with hyperglycemia (HCC) 11/06/2018  . Acute on chronic diastolic (congestive) heart failure (HCC) 11/06/2018  . Mixed hyperlipidemia 10/10/2018  . DM (diabetes mellitus), type 2, uncontrolled w/neurologic complication (HCC) 10/10/2018  . History of transient ischemic attack (TIA) 10/10/2018  . OSA (obstructive sleep apnea) 10/10/2018  . Essential hypertension 10/10/2018  . Chest pain 05/16/2018  . Type 2 diabetes mellitus with hyperlipidemia (HCC) 05/16/2018  . Vitamin D deficiency 11/02/2015  . Obesity (BMI 30-39.9) 04/17/2014  . Reflux gastritis 04/17/2014  . Hiatal hernia without gangrene and obstruction 04/17/2014  . Plantar fasciitis, left 03/02/2014  . Diabetic peripheral neuropathy associated with type 2 diabetes mellitus (HCC) 11/22/2013  . Epididymitis 02/04/2012  . UTI (urinary tract infection) 02/04/2012  . DDD (degenerative disc disease), cervical 10/24/2010  . Gout, unspecified 10/24/2010  . OA (osteoarthritis) 10/24/2010  . Allergic rhinitis 03/04/2007  . Asthma 03/04/2007   PCP:  Rudi Coco, MD Pharmacy:   [facility] [place] [place] - [address] [place], [address] [place], [address] [place]  [address] Phone: [phone] Fax: [phone]  Karin Golden at [facility] [place], [place] - [address] [place] [address] Phone: [phone] Fax: [phone]     Social Determinants of Health (SDOH) Interventions    Readmission Risk Interventions Readmission Risk Prevention Plan 11/18/2018  Transportation Screening Complete  PCP or Specialist Appt within 5-7 Days Not Complete  Home Care Screening Complete  Medication Review (RN CM) Complete

## 2020-12-14 LAB — HEMOGLOBIN AND HEMATOCRIT, BLOOD
HCT: 42.6 % (ref 39.0–52.0)
Hemoglobin: 14.2 g/dL (ref 13.0–17.0)

## 2020-12-14 LAB — GLUCOSE, CAPILLARY
Glucose-Capillary: 228 mg/dL — ABNORMAL HIGH (ref 70–99)
Glucose-Capillary: 301 mg/dL — ABNORMAL HIGH (ref 70–99)
Glucose-Capillary: 318 mg/dL — ABNORMAL HIGH (ref 70–99)

## 2020-12-14 MED ORDER — INSULIN GLARGINE 100 UNIT/ML ~~LOC~~ SOLN
65.0000 [IU] | Freq: Two times a day (BID) | SUBCUTANEOUS | Status: DC
  Administered 2020-12-14: 65 [IU] via SUBCUTANEOUS
  Filled 2020-12-14 (×2): qty 0.65

## 2020-12-14 MED ORDER — INSULIN GLARGINE 100 UNIT/ML ~~LOC~~ SOLN: 65.0000 [IU] | Freq: Two times a day (BID) | SUBCUTANEOUS | 11 refills | Status: DC

## 2020-12-14 MED ORDER — INSULIN ASPART 100 UNIT/ML ~~LOC~~ SOLN: 45.0000 [IU] | Freq: Three times a day (TID) | SUBCUTANEOUS | 11 refills | Status: DC

## 2020-12-14 MED ORDER — HYDROCODONE-ACETAMINOPHEN 5-325 MG PO TABS: 1.0000 | ORAL_TABLET | Freq: Two times a day (BID) | ORAL | 0 refills | Status: AC | PRN

## 2020-12-14 NOTE — Discharge Summary (Signed)
Physician Discharge Summary  Amine Adelson HGD:[id number] DOB: 1961-04-11 DOA: 12/11/2020  PCP: Rudi Coco, MD  Admit date: 12/11/2020 Discharge date: 12/14/2020  Admitted From: Home Disposition: Skilled nursing facility  Recommendations for Outpatient Follow-up:  1. Follow up with PCP in 1-2 weeks after discharge 2. Use laxatives to avoid constipation. 3. Use compression stockings both legs.  Home Health: Not applicable Equipment/Devices: Not applicable  Discharge Condition: Stable CODE STATUS: Full code Diet recommendation: Low-carb diet  Discharge summary: 60 year old gentleman with history of poorly controlled type 2 diabetes with known A1c of more than 15, severe peripheral polyneuropathy, chronic diastolic heart failure, hyperlipidemia, hypertension, stage IIIa chronic kidney disease presented to the hospital with hyperglycemia, generalized weakness.  Patient  stated that recently he has been progressively weak and having difficulty walking.  Blood sugars more than 300 at home.  Using short acting insulin but not using long-acting insulin due to burning on the skin. Hemodynamically stable in the emergency room.  Creatinine 2.4.  Blood sugar 643 with no anion gap.  Admitted with hyperglycemic crisis and AKI.  Assessment & Plan of care:   Insulin-dependent type 2 diabetes with hyperglycemia, uncontrolled blood sugars: Due to inadequate treatment at home.  Initially treated with insulin infusion and transition to subcu insulin. Patient on Toujeo 125 units at night and NovoLog 45 units with mealtime prescribed, however not taking Toujeo because of local burning sensation.  Repeat A1c more than 15.5.  Tolerating Lantus.  65 units twice a day.  NovoLog 45 units 3 times a day with meals.  Resume glipizide 5 mg daily.  May need further up titration of therapy.  Blood sugars still elevated but acceptable.  Generalized debility due to uncontrolled blood sugars and peripheral  neuropathy and frequent falls: Very deconditioned.  Worked with PT OT.  CT head with no acute findings.  No focal neurological deficits.  Transfer to a skilled rehab to continue inpatient therapies before going home.  Acute kidney injury superimposed on chronic kidney disease stage IIIa: Baseline creatinine 1.26 on recent visit.  Presented with creatinine 2.41.  Treated with IV fluids and levels are already improving.   Chronic diastolic heart failure: Euvolemic.  Beta-blockers with holding parameters.  Resume Aldactone and Lasix today.  Hyperlipidemia: On statin continue.  GERD: On PPI continue.  Patient can wear compression stockings to improve with circulation. He does have some orthostatic hypotension, asymptomatic. Use blood pressure medications with holding parameters.  Metoprolol to hold for blood pressure less than 90 and heart rate less than 55.  Aldactone to hold for blood pressure less than 90.  Patient is medically stable today.  Able to go to skilled nursing rehab today.     Discharge Diagnoses:  Principal Problem:   Hyperglycemia Active Problems:   Diabetic peripheral neuropathy associated with type 2 diabetes mellitus (HCC)   Mixed hyperlipidemia   DM (diabetes mellitus), type 2, uncontrolled w/neurologic complication (HCC)   Essential hypertension   Uncontrolled type 2 diabetes mellitus with hyperglycemia (HCC)   Frequent falls   Generalized weakness   AKI (acute kidney injury) (HCC)   Chronic diastolic CHF (congestive heart failure) (HCC)   Hyperglycemia due to diabetes mellitus [facility])    Discharge Instructions  Discharge Instructions    Diet - low sodium heart healthy   Complete by: As directed    Diet Carb Modified   Complete by: As directed    Increase activity slowly   Complete by: As directed      Allergies as  of 12/14/2020      Reactions   Bee Venom Swelling   Gold-containing Drug Products Rash   Latex Rash   Lisinopril Nausea And  Vomiting, Cough   Coughing, sweating, sneezing      Medication List    STOP taking these medications   insulin glargine (2 Unit Dial) 300 UNIT/ML Solostar Pen Commonly known as: TOUJEO MAX Replaced by: insulin glargine 100 UNIT/ML injection   insulin lispro 100 UNIT/ML KwikPen Commonly known as: HUMALOG   naproxen sodium 220 MG tablet Commonly known as: ALEVE   tiZANidine 4 MG tablet Commonly known as: ZANAFLEX     TAKE these medications   Accu-Chek Aviva Plus test strip Generic drug: glucose blood   acetaminophen 500 MG tablet Commonly known as: TYLENOL Take 500 mg by mouth every 6 (six) hours as needed for mild pain.   allopurinol 300 MG tablet Commonly known as: ZYLOPRIM Take 300 mg by mouth daily.   aspirin EC 81 MG tablet Take 81 mg by mouth daily. What changed: Another medication with the same name was removed. Continue taking this medication, and follow the directions you see here.   atorvastatin 80 MG tablet Commonly known as: LIPITOR TAKE 1 TABLET BY MOUTH  DAILY AT 6 PM What changed: when to take this   B-D ULTRAFINE III SHORT PEN 31G X 8 MM Misc Generic drug: Insulin Pen Needle Use to inject insulin 1 time daily   CO Q10 PO Take 1 tablet by mouth daily.   Easy Comfort Lancets Misc   Fluticasone-Salmeterol 100-50 MCG/DOSE Aepb Commonly known as: ADVAIR Inhale 1 puff into the lungs daily as needed (wheezing).   furosemide 20 MG tablet Commonly known as: LASIX Take 40 mg by mouth daily.   gabapentin 400 MG capsule Commonly known as: NEURONTIN Take 800 mg by mouth 3 (three) times daily.   glipiZIDE 5 MG tablet Commonly known as: GLUCOTROL Take 5 mg by mouth daily.   HYDROcodone-acetaminophen 5-325 MG tablet Commonly known as: NORCO/VICODIN Take 1 tablet by mouth 2 (two) times daily as needed for up to 5 days for moderate pain.   insulin aspart 100 UNIT/ML injection Commonly known as: novoLOG Inject 45 Units into the skin 3 (three) times  daily with meals.   insulin glargine 100 UNIT/ML injection Commonly known as: LANTUS Inject 0.65 mLs (65 Units total) into the skin 2 (two) times daily. Replaces: insulin glargine (2 Unit Dial) 300 UNIT/ML Solostar Pen   metoprolol tartrate 25 MG tablet Commonly known as: LOPRESSOR TAKE ONE-HALF TABLET BY  MOUTH TWICE DAILY   omeprazole 20 MG capsule Commonly known as: PRILOSEC Take 40 mg by mouth daily.   spironolactone 25 MG tablet Commonly known as: ALDACTONE Take 50 mg by mouth daily.       Follow-up Information    Marlowe-Rogers, Hidi, MD Follow up in 1 week(s).   Specialty: Family Medicine Contact information: [address] [place] [address] [phone]              Allergies  Allergen Reactions  . Bee Venom Swelling  . Gold-Containing Drug Products Rash  . Latex Rash  . Lisinopril Nausea And Vomiting and Cough    Coughing, sweating, sneezing    Consultations:  None   Procedures/Studies: DG Chest 2 View  Result Date: 12/11/2020 CLINICAL DATA:  Chest pain EXAM: CHEST - 2 VIEW COMPARISON:  01/25/2019 FINDINGS: Post sternotomy changes. Fracture through first sternal wire as before. No acute airspace disease or pleural effusion.  No pneumothorax. Low lung volumes. IMPRESSION: Low lung volumes. Electronically Signed   By: Jasmine Pang M.D.   On: 12/11/2020 22:04   CT HEAD WO CONTRAST  Result Date: 12/12/2020 CLINICAL DATA:  Increasing leg weakness with multiple falls, initial encounter EXAM: CT HEAD WITHOUT CONTRAST TECHNIQUE: Contiguous axial images were obtained from the base of the skull through the vertex without intravenous contrast. COMPARISON:  None. FINDINGS: Brain: No evidence of acute infarction, hemorrhage, hydrocephalus, extra-axial collection or mass lesion/mass effect. Vascular: No hyperdense vessel or unexpected calcification. Skull: Normal. Negative for fracture or focal lesion. Sinuses/Orbits: No acute finding. Other: None. IMPRESSION: No  acute intracranial abnormality noted. Electronically Signed   By: Alcide Clever M.D.   On: 12/12/2020 08:40   (Echo, Carotid, EGD, Colonoscopy, ERCP)    Subjective: Patient seen and examined.  No overnight events.  He was able to stand up and use the sink.  Blood pressures are low on standing up, 98/77 but patient without any dizziness.  His legs still feel heavy but much better than before.  He is looking forward to go to rehab. Patient complains of occasional streaks of blood with his stool.  Probably outlet bleeding.  He had normal colonoscopy in July 2021.   Discharge Exam: Vitals:   12/13/20 0836 12/13/20 1958  BP: 98/77 104/71  Pulse: 77 79  Resp:  14  Temp:  98 F (36.7 C)  SpO2:  95%   Vitals:   12/13/20 0433 12/13/20 0831 12/13/20 0836 12/13/20 1958  BP:   98/77 104/71  Pulse:   77 79  Resp:    14  Temp:    98 F (36.7 C)  TempSrc:    Axillary  SpO2:  94%  95%  Weight: 84.8 kg     Height:        General: Pt is alert, awake, not in acute distress Sitting in chair.  Looks comfortable today. Cardiovascular: RRR, S1/S2 +, no rubs, no gallops Respiratory: CTA bilaterally, no wheezing, no rhonchi Abdominal: Soft, NT, ND, bowel sounds + Extremities: no edema, no cyanosis    The results of significant diagnostics from this hospitalization (including imaging, microbiology, ancillary and laboratory) are listed below for reference.     Microbiology: Recent Results (from the past 240 hour(s))  SARS CORONAVIRUS 2 (TAT 6-24 HRS) Nasopharyngeal Nasopharyngeal Swab     Status: None   Collection Time: 12/12/20 12:36 AM   Specimen: Nasopharyngeal Swab  Result Value Ref Range Status   SARS Coronavirus 2 NEGATIVE NEGATIVE Final    Comment: (NOTE) SARS-CoV-2 target nucleic acids are NOT DETECTED.  The SARS-CoV-2 RNA is generally detectable in upper and lower respiratory specimens during the acute phase of infection. Negative results do not preclude SARS-CoV-2 infection, do  not rule out co-infections with other pathogens, and should not be used as the sole basis for treatment or other patient management decisions. Negative results must be combined with clinical observations, patient history, and epidemiological information. The expected result is Negative.  Fact Sheet for Patients: HairSlick.no  Fact Sheet for Healthcare Providers: quierodirigir.com  This test is not yet approved or cleared by the [place] FDA and  has been authorized for detection and/or diagnosis of SARS-CoV-2 by FDA under an Emergency Use Authorization (EUA). This EUA will remain  in effect (meaning this test can be used) for the duration of the COVID-19 declaration under Se ction 564(b)(1) of the Act, 21 U.S.C. section 360bbb-3(b)(1), unless the authorization is terminated or revoked sooner.  Performed at [facility], [address] [place], [place] [address]   MRSA PCR Screening     Status: Abnormal   Collection Time: 12/12/20 11:10 AM   Specimen: Nasal Mucosa; Nasopharyngeal  Result Value Ref Range Status   MRSA by PCR POSITIVE (A) NEGATIVE Final    Comment:        The GeneXpert MRSA Assay (FDA approved for NASAL specimens only), is one component of a comprehensive MRSA colonization surveillance program. It is not intended to diagnose MRSA infection nor to guide or monitor treatment for MRSA infections. CRITICAL RESULT CALLED TO, READ BACK BY AND VERIFIED WITH: RN ERIN ROWLETT BY KJ ON 12/12/20 AT 1350 Performed at [facility], [address] [place], [place] [address]      Labs: BNP (last 3 results) Recent Labs    12/12/20 0034  BNP 14.6   Basic Metabolic Panel: Recent Labs  Lab 12/11/20 2139 12/12/20 0420 12/12/20 0700 12/12/20 1112 12/13/20 0027  NA 123* 127* 130* 130* 131*  K 5.3* 3.9 4.0 4.0 4.2  CL 86* 92* 91* 95* 98  CO2 23 23 26 23 26   GLUCOSE 643* 430* 287* 268* 259*   BUN 57* 49* 49* 43* 34*  CREATININE 2.41* 1.93* 1.77* 1.68* 1.55*  CALCIUM 9.3 9.0 9.5 9.2 8.9  MG  --  2.5* 2.6* 2.3  --   PHOS  --   --  4.5  --   --    Liver Function Tests: No results for input(s): AST, ALT, ALKPHOS, BILITOT, PROT, ALBUMIN in the last 168 hours. No results for input(s): LIPASE, AMYLASE in the last 168 hours. No results for input(s): AMMONIA in the last 168 hours. CBC: Recent Labs  Lab 12/11/20 2139  WBC 9.1  HGB 15.7  HCT 44.4  MCV 85.4  PLT 196   Cardiac Enzymes: No results for input(s): CKTOTAL, CKMB, CKMBINDEX, TROPONINI in the last 168 hours. BNP: Invalid input(s): POCBNP CBG: Recent Labs  Lab 12/13/20 1957 12/13/20 2133 12/14/20 0012 12/14/20 0826 12/14/20 1206  GLUCAP 266* 275* 228* 318* 301*   D-Dimer No results for input(s): DDIMER in the last 72 hours. Hgb A1c Recent Labs    12/12/20 0747  HGBA1C >15.5*   Lipid Profile No results for input(s): CHOL, HDL, LDLCALC, TRIG, CHOLHDL, LDLDIRECT in the last 72 hours. Thyroid function studies Recent Labs    12/12/20 0420  TSH 1.740   Anemia work up No results for input(s): VITAMINB12, FOLATE, FERRITIN, TIBC, IRON, RETICCTPCT in the last 72 hours. Urinalysis    Component Value Date/Time   COLORURINE YELLOW 12/12/2020 0200   APPEARANCEUR CLEAR 12/12/2020 0200   LABSPEC 1.021 12/12/2020 0200   PHURINE 5.0 12/12/2020 0200   GLUCOSEU >=500 (A) 12/12/2020 0200   HGBUR NEGATIVE 12/12/2020 0200   BILIRUBINUR NEGATIVE 12/12/2020 0200   KETONESUR NEGATIVE 12/12/2020 0200   PROTEINUR NEGATIVE 12/12/2020 0200   NITRITE NEGATIVE 12/12/2020 0200   LEUKOCYTESUR NEGATIVE 12/12/2020 0200   Sepsis Labs Invalid input(s): PROCALCITONIN,  WBC,  LACTICIDVEN Microbiology Recent Results (from the past 240 hour(s))  SARS CORONAVIRUS 2 (TAT 6-24 HRS) Nasopharyngeal Nasopharyngeal Swab     Status: None   Collection Time: 12/12/20 12:36 AM   Specimen: Nasopharyngeal Swab  Result Value Ref Range  Status   SARS Coronavirus 2 NEGATIVE NEGATIVE Final    Comment: (NOTE) SARS-CoV-2 target nucleic acids are NOT DETECTED.  The SARS-CoV-2 RNA is generally detectable in upper and lower respiratory specimens during the  acute phase of infection. Negative results do not preclude SARS-CoV-2 infection, do not rule out co-infections with other pathogens, and should not be used as the sole basis for treatment or other patient management decisions. Negative results must be combined with clinical observations, patient history, and epidemiological information. The expected result is Negative.  Fact Sheet for Patients: HairSlick.no  Fact Sheet for Healthcare Providers: quierodirigir.com  This test is not yet approved or cleared by the [place] FDA and  has been authorized for detection and/or diagnosis of SARS-CoV-2 by FDA under an Emergency Use Authorization (EUA). This EUA will remain  in effect (meaning this test can be used) for the duration of the COVID-19 declaration under Se ction 564(b)(1) of the Act, 21 U.S.C. section 360bbb-3(b)(1), unless the authorization is terminated or revoked sooner.  Performed at [facility], [address], [place], [place] [address]   MRSA PCR Screening     Status: Abnormal   Collection Time: 12/12/20 11:10 AM   Specimen: Nasal Mucosa; Nasopharyngeal  Result Value Ref Range Status   MRSA by PCR POSITIVE (A) NEGATIVE Final    Comment:        The GeneXpert MRSA Assay (FDA approved for NASAL specimens only), is one component of a comprehensive MRSA colonization surveillance program. It is not intended to diagnose MRSA infection nor to guide or monitor treatment for MRSA infections. CRITICAL RESULT CALLED TO, READ BACK BY AND VERIFIED WITH: RN ERIN ROWLETT BY KJ ON 12/12/20 AT 1350 Performed at [facility], [address] [place], [place] [address]      Time coordinating  discharge:  32 minutes  SIGNED:   Dorcas Carrow, MD  [facility] 12/14/2020, 1:42 PM

## 2020-12-14 NOTE — TOC Progression Note (Addendum)
Transition of Care [facility]) - Progression Note    Patient Details  Name: Eric [name] MRN: [id number] Date of Birth: 1961-08-31  [facility]) CM/SW Contact  Mearl Latin, LCSW Phone Number: 12/14/2020, 9:32 AM  Clinical Narrative:    9:32am-Insurance approval still pending. CSW left another voicemail for [facility] Admissions. If no return call, will proceed with [facility]. Checking with [facility] if 4/13 COVID test is still valid.   11:30am-CSW received call from patient's spouse. She is aware [facility] has not returned calls and is accepting of [facility]. She reported being hopeful to watch [facility] service with the patient.  12:15pm-CSW received insurance approval for patient to discharge to [facility] today: Ref # [id number], effective 12/14/20-12/18/20.  1pm-CSW updated patient's spouse that patient will discharge after labs result but that [facility] is behind. She stated understanding but cannot come pick patient up. CSW went ahead and put patient on the [facility] waitlist but may be very late. Will send the dc summary once available.    Expected Discharge Plan: Skilled Nursing Facility Barriers to Discharge: Continued Medical Work up  Expected Discharge Plan and Services Expected Discharge Plan: Skilled Nursing Facility In-house Referral: Clinical Social Work Discharge Planning Services: CM Consult Post Acute Care Choice: Skilled Nursing Facility Living arrangements for the past 2 months: Single Family Home Expected Discharge Date: 12/14/20                                     Social Determinants of Health (SDOH) Interventions    Readmission Risk Interventions Readmission Risk Prevention Plan 11/18/2018  Transportation Screening Complete  PCP or Specialist Appt within 5-7 Days Not Complete  Home Care Screening Complete  Medication Review (RN CM) Complete

## 2020-12-14 NOTE — TOC Transition Note (Signed)
Transition of Care [facility]) - CM/SW Discharge Note   Patient Details  Name: Eric [name] MRN: [id number] Date of Birth: 1960-11-16  Transition of Care [facility]) CM/SW Contact:  Mearl Latin, LCSW Phone Number: 12/14/2020, 2:25 PM   Clinical Narrative:    Patient will DC to: [facility] [place] Anticipated DC date: 12/14/20 Family notified: Spouse, Building control surveyor by: [facility] transport (PTAR too backed up)   Per MD patient ready for DC to [facility] [place]. RN to call report prior to discharge [phone]). RN, patient, patient's family, and facility notified of DC. Discharge Summary and FL2 sent to facility. DC packet on chart. Transport requested for patient.   CSW will sign off for now as social work intervention is no longer needed. Please consult [place] again if new needs arise.     Final next level of care: Skilled Nursing Facility Barriers to Discharge: Barriers Resolved   Patient Goals and CMS Choice Patient states their goals for this hospitalization and ongoing recovery are:: To feel better CMS Medicare.gov Compare Post Acute Care list provided to:: Patient Choice offered to / list presented to : [facility]  Discharge Placement   Existing PASRR number confirmed : 12/14/20          Patient chooses bed at: [facility] [place] Patient to be transferred to facility by: [facility] Name of family member notified: Spouse Patient and family notified of of transfer: 12/14/20  Discharge Plan and Services In-house Referral: Clinical Social Work Discharge Planning Services: CM Consult Post Acute Care Choice: Skilled Nursing Facility                               Social Determinants of Health (SDOH) Interventions     Readmission Risk Interventions Readmission Risk Prevention Plan 11/18/2018  Transportation Screening Complete  PCP or Specialist Appt within 5-7 Days Not Complete  Home Care Screening Complete  Medication Review (RN CM) Complete

## 2020-12-20 LAB — METHYLMALONIC ACID, SERUM: Methylmalonic Acid, Quantitative: 445 nmol/L — ABNORMAL HIGH (ref 0–378)

## 2021-01-11 ENCOUNTER — Other Ambulatory Visit: Payer: Self-pay

## 2021-01-11 MED ORDER — SPIRONOLACTONE 25 MG PO TABS: 50.0000 mg | ORAL_TABLET | Freq: Every day | ORAL | 3 refills | Status: DC

## 2021-02-27 ENCOUNTER — Telehealth: Payer: Self-pay | Admitting: Cardiovascular Disease

## 2021-02-27 NOTE — Telephone Encounter (Signed)
Spoke to patient. Patient states every time he stands  or walk he develops  chest pain/discomfort . He states he does not have pain while sitting. Patient states the  pain has becoming worse since yesterday .  Patient does not have nitroglycerin to take. Patient has not checked blood pressure or heart rate.  Patient states the pain center of chest .  RN informed patient go have an evaluation at  nearest ER. Patient states he lives in [place] [facility] but he will come to [place]. He said he did not want to go anywhere else  RN again ,recommend patient go t o nearest ER

## 2021-02-27 NOTE — Telephone Encounter (Signed)
Pt c/o of Chest Pain: STAT if CP now or developed within 24 hours  1. Are you having CP right now? Yes, sharp pain now not pressure  2. Are you experiencing any other symptoms (ex. SOB, nausea, vomiting, sweating)? Gets SOB  3. How long have you been experiencing CP? Started day before yesterday  4. Is your CP continuous or coming and going? Comes and goes, but getting worse.  5. Have you taken Nitroglycerin? No   Patient states he has been getting chest pressure when he stands or walks. He states when he sits it goes away. He states right now he is sitting and has a sharp pain, but no pressure. He states he gets SOB, because the pain is so bad.  ?

## 2021-12-08 ENCOUNTER — Encounter: Payer: Self-pay | Admitting: Cardiovascular Disease

## 2022-01-08 ENCOUNTER — Ambulatory Visit: Payer: Medicare HMO | Admitting: Cardiovascular Disease

## 2022-01-08 VITALS — BP 81/60 | Ht 66.0 in | Wt 168.4 lb

## 2022-01-08 DIAGNOSIS — I4891 Unspecified atrial fibrillation: Secondary | ICD-10-CM

## 2022-01-08 DIAGNOSIS — I25810 Atherosclerosis of coronary artery bypass graft(s) without angina pectoris: Secondary | ICD-10-CM | POA: Diagnosis not present

## 2022-01-08 DIAGNOSIS — J984 Other disorders of lung: Secondary | ICD-10-CM

## 2022-01-08 DIAGNOSIS — I9789 Other postprocedural complications and disorders of the circulatory system, not elsewhere classified: Secondary | ICD-10-CM

## 2022-01-08 DIAGNOSIS — E1165 Type 2 diabetes mellitus with hyperglycemia: Secondary | ICD-10-CM | POA: Diagnosis not present

## 2022-01-08 DIAGNOSIS — I5032 Chronic diastolic (congestive) heart failure: Secondary | ICD-10-CM

## 2022-01-08 DIAGNOSIS — G4733 Obstructive sleep apnea (adult) (pediatric): Secondary | ICD-10-CM

## 2022-01-08 DIAGNOSIS — I9589 Other hypotension: Secondary | ICD-10-CM | POA: Diagnosis not present

## 2022-01-08 DIAGNOSIS — E785 Hyperlipidemia, unspecified: Secondary | ICD-10-CM

## 2022-01-08 DIAGNOSIS — E861 Hypovolemia: Secondary | ICD-10-CM | POA: Diagnosis not present

## 2022-01-08 DIAGNOSIS — Z8673 Personal history of transient ischemic attack (TIA), and cerebral infarction without residual deficits: Secondary | ICD-10-CM

## 2022-01-08 DIAGNOSIS — I10 Essential (primary) hypertension: Secondary | ICD-10-CM

## 2022-01-08 MED ORDER — FUROSEMIDE 20 MG PO TABS: ORAL_TABLET | ORAL | 2 refills | Status: DC

## 2022-01-08 MED ORDER — SPIRONOLACTONE 25 MG PO TABS: ORAL_TABLET | ORAL | 3 refills | Status: DC

## 2022-01-08 NOTE — Patient Instructions (Signed)
Medication Instructions:  ?Furosemide: Take 20 mg once daily for a weight over 175 lbs ?Spironolactone: Take 25 mg once daily for a weight over 175 lb ? ?STOP the Metoprolol ? ?*If you need a refill on your cardiac medications before your next appointment, please call your pharmacy* ? ? ?Lab Work: ?None ordered ? ?If you have labs (blood work) drawn today and your tests are completely normal, you will receive your results only by: ?MyChart Message (if you have MyChart) OR ?A paper copy in the mail ?If you have any lab test that is abnormal or we need to change your treatment, we will call you to review the results. ? ? ?Testing/Procedures: ?None ordered ? ? ?Follow-Up: ?At [facility], you and your health needs are our priority.  As part of our continuing mission to provide you with exceptional heart care, we have created designated Provider Care Teams.  These Care Teams include your primary Cardiologist (physician) and Advanced Practice Providers (APPs -  Physician Assistants and Nurse Practitioners) who all work together to provide you with the care you need, when you need it. ? ?We recommend signing up for the patient portal called "MyChart".  Sign up information is provided on this After Visit Summary.  MyChart is used to connect with patients for Virtual Visits (Telemedicine).  Patients are able to view lab/test results, encounter notes, upcoming appointments, etc.  Non-urgent messages can be sent to your provider as well.   ?To learn more about what you can do with MyChart, go to [email or web address].   ? ?Your next appointment:   ?2 month(s) ? ?The format for your next appointment:   ?In Person ? ?Provider:   ?Thurmon Fair, MD   ? ? ?Other Instructions ?Call the office if your weight reaches and exceeds 180 pounds [phone]) ? ?Important Information About Sugar ? ? ? ? ? ? ?

## 2022-01-08 NOTE — Progress Notes (Signed)
? ?Cardiology office note  ? ? ?Date:  01/11/2022  ? ?ID:  Eric [name], DOB 1961/04/25, MRN [id number] ? ? ?PCP:  Marlowe-Rogers, Hidi, MD  ?Cardiologist:  Sanda Klein, MD  ?Electrophysiologist:  None  ? ?Evaluation Performed:  Follow-Up Visit ? ?Chief Complaint:  CAD; recent hospitalization ? ? ?No chief complaint on file. ? ? ?History of Present Illness:   ? ?Eric [name] is a 61 y.o. male with a hx of CAD s/p CABG March [id number] (complicated by brief postoperative atrial fibrillation that has not recurred), history of diastolic heart failure, type 2 diabetes mellitus (poorly controlled and complicated by polyneuropathy, severe mixed hyperlipidemia, fatty liver),  history of TIA, OSA, gastroesophageal reflux disease. ? ?He was hospitalized at [facility] from 03/29 to 12/06/2021 due to diabetic ketoacidosis and COVID-pneumonia.  He was not intubated.  After that he went to rehab.  He has only recently returned home.  He is getting home PT/OT.  He is no longer on oxygen. ? ?He had some issues with orthostatic hypotension and was prescribed Florinef, but I do not think he is taking it right now.  During that hospital stay he underwent an echocardiogram that shows normal left ventricular systolic function with an EF of 55 to 60%.  Most recent lipid profile shows an LDL of 53 but also extremely low HDL of 19.  Glycemic control is very poor with hemoglobin A1c of 11.1%.  He is poorly compliant with a carbohydrate restricted diet. ? 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 ? ?He denies chest pain at rest or with activity (he is extremely sedentary).  He has NYHA functional class III exertional dyspnea and uses inhalers.  He feels very weak.  He is thirsty all the time.  He has polyuria.  He is dizzy, but has not had syncope.  His blood pressure today was 80/60 equal in the right and  left upper extremities. ? ?Due to recurrent complaints of fatigue, dyspnea and a persistent nonproductive cough he eventually underwent right and left heart catheterization on 12/16/2019.  Right and left heart filling pressures were very normal (mean PAWP 12, mean RA 4, PA 27/8, LVEDP 12, cardiac index 2.1).  There was no evidence of congestive heart failure.  Angiography did show early failure of the SVG-ramus intermedius, but the ramus intermedius artery itself had about a 60% stenosis that was not significant by FFR.  The LIMA to the LAD was widely patent.  There was an approximately 40% stenosis in the left circumflex and no visible stenosis in the right coronary artery. ? ?Note that he did have pulmonary function tests before his bypass surgery in March 2020.  FVC was 2.3 L (53% of predicted) and FEV1 was 1.83 L/minute (comments right reduction of 55% of predicted). ? ?He has a relatively short history of smoking 1 pack a day for about 10 years (he started at age 46), but has not smoked since 89.  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 ? ?Past Medical History:  ?Diagnosis Date  ? Coronary artery disease   ? Diabetes mellitus without complication ([place])   ? Fatty liver   ? Hyperlipidemia   ? Stroke [facility])   ? ? ?Past Surgical History:  ?Procedure Laterality Date  ? CHOLECYSTECTOMY    ? CORONARY ARTERY BYPASS GRAFT N/A 11/10/2018  ? Procedure: CORONARY ARTERY BYPASS GRAFTING (CABG)  x Two , using left internal mammary artery and right leg greater saphenous vein harvested endoscopically - SVG to Ramus, LIMA to LAD;  Surgeon: Grace Isaac, MD;  Location: [place];  Service: Open Heart Surgery;  Laterality: N/A;  ? INTRAVASCULAR PRESSURE WIRE/FFR STUDY N/A 12/16/2019  ? Procedure: INTRAVASCULAR PRESSURE WIRE/FFR STUDY;  Surgeon: Leonie Man, MD;  Location: [place] CV [facility];  Service: Cardiovascular;  Laterality: N/A;  ? LEFT HEART CATH AND CORONARY ANGIOGRAPHY N/A 11/09/2018  ? Procedure: LEFT HEART CATH  AND CORONARY ANGIOGRAPHY;  Surgeon: Troy Sine, MD;  Location: [place] CV [facility];  Service: Cardiovascular;  Laterality: N/A;  ? RIGHT/LEFT HEART CATH AND CORONARY/GRAFT ANGIOGRAPHY N/A 12/16/2019  ? Procedure: RIGHT/LEFT HEART CATH AND CORONARY/GRAFT ANGIOGRAPHY;  Surgeon: Leonie Man, MD;  Location: [place] [facility];  Service: Cardiovascular;  Laterality: N/A;  ? SHOULDER SURGERY Left   ? TEE WITHOUT CARDIOVERSION N/A 11/10/2018  ? Procedure: TRANSESOPHAGEAL ECHOCARDIOGRAM (TEE);  Surgeon: Grace Isaac, MD;  Location: [place];  Service: Open Heart Surgery;  Laterality: N/A;  ? ? ?Current Medications: ?Current Meds  ?Medication Sig  ? allopurinol (ZYLOPRIM) 300 MG tablet Take 300 mg by mouth daily.   ? aspirin EC 81 MG tablet Take 81 mg by mouth daily.  ? atorvastatin (LIPITOR) 80 MG tablet TAKE 1 TABLET BY MOUTH  DAILY AT 6 PM (Patient taking differently: Take 80 mg by mouth daily.)  ? atorvastatin (LIPITOR) 80 MG tablet Take 1 tablet by mouth daily.  ? baclofen (LIORESAL) 10 MG tablet Take 10 mg by mouth as needed.  ? Continuous Blood Gluc Receiver (FREESTYLE LIBRE 2 READER) DEVI by Does not apply route.  ? Continuous Blood Gluc Sensor (FREESTYLE LIBRE 2 SENSOR) MISC by Does not apply route.  ? gabapentin (NEURONTIN) 400 MG capsule Take 800 mg by mouth 3 (three) times daily.   ? gabapentin (NEURONTIN) 800 MG tablet Take 800 mg by mouth in the morning, at noon, and at bedtime.  ? Glucagon (BAQSIMI ONE PACK) 3 MG/DOSE POWD Place into the nose.  ? HYDROcodone-acetaminophen (NORCO) 7.5-325 MG tablet Take by mouth every 6 (six) hours.  ? insulin glargine (LANTUS) 100 UNIT/ML injection Inject 0.65 mLs (65 Units total) into the skin 2 (two) times daily.  ? insulin lispro (HUMALOG KWIKPEN) 200 UNIT/ML KwikPen Inject per scale three times daily with meals.  Max total daily insulin 200 units.  ? Insulin Pen Needle (B-D ULTRAFINE III SHORT PEN) 31G X 8 MM MISC Use to inject insulin 1 time daily  ? omeprazole  (PRILOSEC) 20 MG capsule Take 40 mg by mouth daily.  ? promethazine (PHENERGAN) 12.5 MG tablet Take 12.5 mg by mouth daily in the afternoon.  ? [DISCONTINUED] furosemide (LASIX) 40 MG tablet Take 40 mg by mouth.  ? [DISCONTINUED] metoprolol tartrate (LOPRESSOR) 25 MG tablet TAKE ONE-HALF TABLET BY  MOUTH TWICE DAILY (Patient taking differently: Take 12.5 mg by mouth 2 (two) times daily.)  ? [DISCONTINUED] spironolactone (ALDACTONE) 25 MG tablet Take 2 tablets (50 mg total) by mouth daily.  ?  ? ?Allergies:   Bee venom, Gold-containing drug products, Latex, and Lisinopril  ? ?Social History  ? ?Socioeconomic History  ? Marital status: Married  ?  Spouse name: Rudene Christians   ? Number of children: 3  ? Years of education: Not on file  ? Highest education level: Not on file  ?Occupational History  ? Occupation: disability   ?Tobacco Use  ? Smoking status:  Former  ?  Packs/day: 1.00  ?  Years: 12.00  ?  Pack years: 12.00  ?  Types: Cigarettes  ?  Start date: 1968  ?  Quit date: 11  ?  Years since quitting: 42.3  ? Smokeless tobacco: Current  ?  Types: Chew  ?Vaping Use  ? Vaping Use: Never used  ?Substance and Sexual Activity  ? Alcohol use: Yes  ?  Comment: rare social couple of times a year  ? Drug use: Never  ? Sexual activity: Not on file  ?Other Topics Concern  ? Not on file  ?Social History Narrative  ? Smoked from 1st grade to 12th grade then quit  ? ?Social Determinants of Health  ? ?Financial Resource Strain: Not on file  ?Food Insecurity: Not on file  ?Transportation Needs: Not on file  ?Physical Activity: Not on file  ?Stress: Not on file  ?Social Connections: Not on file  ?Does use snuff ? ?Family History: ?The patient's family history includes Heart attack in his father; Heart failure in his father; Hyperlipidemia in his mother; Hypothyroidism in his mother. ?His father passed away at age 17 from congestive heart failure. ? ?ROS:   ?Please see the history of present illness.    ?All other systems are reviewed  and are negative. ? ?EKGs/Labs/Other Studies Reviewed:   ? ?The following studies were reviewed today: ?Cardiac catheterization December 16, 2019 ?Right and left heart filling pressures were very normal (mea

## 2022-01-11 ENCOUNTER — Encounter: Payer: Self-pay | Admitting: Cardiovascular Disease

## 2022-04-02 ENCOUNTER — Encounter: Payer: Self-pay | Admitting: Cardiovascular Disease

## 2022-04-02 ENCOUNTER — Ambulatory Visit: Payer: Medicare HMO | Admitting: Cardiovascular Disease

## 2022-04-02 VITALS — BP 94/68 | HR 80 | Ht 66.0 in | Wt 189.9 lb

## 2022-04-02 DIAGNOSIS — Z8673 Personal history of transient ischemic attack (TIA), and cerebral infarction without residual deficits: Secondary | ICD-10-CM

## 2022-04-02 DIAGNOSIS — N521 Erectile dysfunction due to diseases classified elsewhere: Secondary | ICD-10-CM

## 2022-04-02 DIAGNOSIS — G4733 Obstructive sleep apnea (adult) (pediatric): Secondary | ICD-10-CM

## 2022-04-02 DIAGNOSIS — I25810 Atherosclerosis of coronary artery bypass graft(s) without angina pectoris: Secondary | ICD-10-CM

## 2022-04-02 DIAGNOSIS — J984 Other disorders of lung: Secondary | ICD-10-CM

## 2022-04-02 DIAGNOSIS — E782 Mixed hyperlipidemia: Secondary | ICD-10-CM | POA: Diagnosis not present

## 2022-04-02 DIAGNOSIS — I5032 Chronic diastolic (congestive) heart failure: Secondary | ICD-10-CM | POA: Diagnosis not present

## 2022-04-02 DIAGNOSIS — I4891 Unspecified atrial fibrillation: Secondary | ICD-10-CM

## 2022-04-02 DIAGNOSIS — E1165 Type 2 diabetes mellitus with hyperglycemia: Secondary | ICD-10-CM | POA: Diagnosis not present

## 2022-04-02 DIAGNOSIS — I9789 Other postprocedural complications and disorders of the circulatory system, not elsewhere classified: Secondary | ICD-10-CM

## 2022-04-02 MED ORDER — SILDENAFIL CITRATE 50 MG PO TABS: 50.0000 mg | ORAL_TABLET | Freq: Every day | ORAL | 0 refills | Status: DC | PRN

## 2022-04-02 NOTE — Patient Instructions (Signed)
Medication Instructions:  TAKE Sildenafil 50 mg once daily as needed. May take a second tablet if needed  *If you need a refill on your cardiac medications before your next appointment, please call your pharmacy*   Lab Work: None ordered If you have labs (blood work) drawn today and your tests are completely normal, you will receive your results only by: MyChart Message (if you have MyChart) OR A paper copy in the mail If you have any lab test that is abnormal or we need to change your treatment, we will call you to review the results.   Testing/Procedures: None ordered   Follow-Up: At [facility], you and your health needs are our priority.  As part of our continuing mission to provide you with exceptional heart care, we have created designated Provider Care Teams.  These Care Teams include your primary Cardiologist (physician) and Advanced Practice Providers (APPs -  Physician Assistants and Nurse Practitioners) who all work together to provide you with the care you need, when you need it.  We recommend signing up for the patient portal called "MyChart".  Sign up information is provided on this After Visit Summary.  MyChart is used to connect with patients for Virtual Visits (Telemedicine).  Patients are able to view lab/test results, encounter notes, upcoming appointments, etc.  Non-urgent messages can be sent to your provider as well.   To learn more about what you can do with MyChart, go to [email or web address].    Your next appointment:   6 month(s)  The format for your next appointment:   In Person  Provider:   Thurmon Fair, MD {  Important Information About Sugar

## 2022-04-02 NOTE — Progress Notes (Signed)
Cardiology office note    Date:  04/09/2022   ID:  Eric [name], DOB 1961-03-18, MRN [id number]   PCP:  Rudi Coco, MD  Cardiologist:  Thurmon Fair, MD  Electrophysiologist:  None   Evaluation Performed:  Follow-Up Visit  Chief Complaint:  CAD; recent hospitalization   Chief Complaint  Patient presents with   Coronary Artery Disease    History of Present Illness:    Eric [name] is a 61 y.o. male with a hx of CAD s/p CABG March 2020 (complicated by brief postoperative atrial fibrillation that has not recurred), history of diastolic heart failure, type 2 diabetes mellitus (poorly controlled and complicated by polyneuropathy, severe mixed hyperlipidemia, fatty liver),  history of TIA, OSA, gastroesophageal reflux disease.  Biggest complaint continues to be weakness and severe neuropathy in his lower extremities.  He denies chest pain at rest or with activity, but he remains extremely sedentary.  Limited both by dyspnea and pain in both his hips and unsteadiness due to neuropathy.  NYHA functional class II-3, may be some improvement since his last appointment, breathing does improve with inhalers.  Glycemic control is somewhat better.  Glucose is no longer in the 500-700 range, but rather around 200.  Complains of erectile dysfunction and would like to try a Viagra type medication.  He was hospitalized at [facility] from 03/29 to 12/06/2021 due to diabetic ketoacidosis and COVID-pneumonia.  He was not intubated.  After that he went to rehab.  He was hospitalized again in late May with "frequent falls".  CT head showed no new abnormalities.  Labs were significant for hyperglycemia but no evidence of major metabolic derangement.  ECG was unremarkable.  He was found to have orthostatic hypotension and improved with hydration.  He has significant neuropathy.  During the [facility] hospital stay he underwent an echocardiogram that shows normal left ventricular systolic  function with an EF of 55 to 60%.  Most recent lipid profile shows an LDL of 53 but also extremely low HDL of 19.  Glycemic control is very poor with hemoglobin A1c of 11.1%.  He is poorly compliant with a carbohydrate restricted diet.  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  Due to recurrent complaints of fatigue, dyspnea and a persistent nonproductive cough he eventually underwent right and left heart catheterization on 12/16/2019.  Right and left heart filling pressures were very normal (mean PAWP 12, mean RA 4, PA 27/8, LVEDP 12, cardiac index 2.1).  There was no evidence of congestive heart failure.  Angiography did show early failure of the SVG-ramus intermedius, but the ramus intermedius artery itself had about a 60% stenosis that was not significant by FFR.  The LIMA to the LAD was widely patent.  There was an approximately 40% stenosis in the left circumflex and no visible stenosis in the right coronary artery.  Note that he did have pulmonary function tests before his bypass surgery in March 2020.  FVC was 2.3 L (53% of predicted) and FEV1 was 1.83 L/minute (comments right reduction of 55% of predicted).  He has a relatively short history of smoking 1 pack a day for about 10 years (he started at age 57), but has not smoked since 50.  He worked as a Psychologist, occupational for a very long time.  Past Medical History:  Diagnosis Date   Coronary artery disease    Diabetes mellitus without complication (  [place])    Fatty liver    Hyperlipidemia    Stroke [facility])     Past Surgical History:  Procedure Laterality Date   CHOLECYSTECTOMY     CORONARY ARTERY BYPASS GRAFT N/A 11/10/2018   Procedure: CORONARY ARTERY BYPASS GRAFTING (CABG) x Two , using left internal mammary artery and right leg greater saphenous vein harvested endoscopically - SVG to Ramus, LIMA to LAD;  Surgeon:  Grace Isaac, MD;  Location: [place];  Service: Open Heart Surgery;  Laterality: N/A;   INTRAVASCULAR PRESSURE WIRE/FFR STUDY N/A 12/16/2019   Procedure: INTRAVASCULAR PRESSURE WIRE/FFR STUDY;  Surgeon: Leonie Man, MD;  Location: [place] CV LAB;  Service: Cardiovascular;  Laterality: N/A;   LEFT HEART CATH AND CORONARY ANGIOGRAPHY N/A 11/09/2018   Procedure: LEFT HEART CATH AND CORONARY ANGIOGRAPHY;  Surgeon: Troy Sine, MD;  Location: [place] CV LAB;  Service: Cardiovascular;  Laterality: N/A;   RIGHT/LEFT HEART CATH AND CORONARY/GRAFT ANGIOGRAPHY N/A 12/16/2019   Procedure: RIGHT/LEFT HEART CATH AND CORONARY/GRAFT ANGIOGRAPHY;  Surgeon: Leonie Man, MD;  Location: [place] [facility];  Service: Cardiovascular;  Laterality: N/A;   SHOULDER SURGERY Left    TEE WITHOUT CARDIOVERSION N/A 11/10/2018   Procedure: TRANSESOPHAGEAL ECHOCARDIOGRAM (TEE);  Surgeon: Grace Isaac, MD;  Location: [place];  Service: Open Heart Surgery;  Laterality: N/A;    Current Medications: Current Meds  Medication Sig   allopurinol (ZYLOPRIM) 300 MG tablet Take 300 mg by mouth daily.    aspirin EC 81 MG tablet Take 81 mg by mouth daily.   atorvastatin (LIPITOR) 80 MG tablet Take 1 tablet by mouth daily.   Coenzyme Q10 (CO Q10 PO) Take 1 tablet by mouth daily.   Continuous Blood Gluc Receiver (FREESTYLE LIBRE 2 READER) DEVI by Does not apply route.   Continuous Blood Gluc Sensor (FREESTYLE LIBRE 2 SENSOR) MISC by Does not apply route.   furosemide (LASIX) 20 MG tablet Take one tablet daily for a weight over 175 lbs   gabapentin (NEURONTIN) 600 MG tablet Take 600 mg by mouth 3 (three) times daily.   HYDROcodone-acetaminophen (NORCO/VICODIN) 5-325 MG tablet Take by mouth every 6 (six) hours as needed.   insulin degludec (TRESIBA) 200 UNIT/ML FlexTouch Pen Inject 50 Units into the skin daily in the afternoon.   insulin glargine (LANTUS) 100 UNIT/ML injection Inject 0.65 mLs (65 Units total)  into the skin 2 (two) times daily.   insulin lispro (HUMALOG KWIKPEN) 200 UNIT/ML KwikPen Inject per scale three times daily with meals.  Max total daily insulin 200 units.   Insulin Pen Needle (B-D ULTRAFINE III SHORT PEN) 31G X 8 MM MISC Use to inject insulin 1 time daily   omeprazole (PRILOSEC) 40 MG capsule Take 40 mg by mouth daily.   sildenafil (VIAGRA) 50 MG tablet Take 1 tablet (50 mg total) by mouth daily as needed for erectile dysfunction.   spironolactone (ALDACTONE) 25 MG tablet Take one tablet daily for a weight over 175lbs   [DISCONTINUED] HYDROcodone-acetaminophen (NORCO) 7.5-325 MG tablet Take by mouth every 6 (six) hours.     Allergies:   Bee venom, Gold-containing drug products, Latex, and Lisinopril   Social History   Socioeconomic History   Marital status: Married    Spouse name: [name]    Number of children: 3   Years of education: Not on file   Highest education level: Not on file  Occupational History   Occupation: disability   Tobacco Use   Smoking  status: Former    Packs/day: 1.00    Years: 12.00    Total pack years: 12.00    Types: Cigarettes    Start date: 1968    Quit date: 1981    Years since quitting: 42.6   Smokeless tobacco: Former    Types: Associate Professor Use: Never used  Substance and Sexual Activity   Alcohol use: Yes    Comment: rare social couple of times a year   Drug use: Never   Sexual activity: Not on file  Other Topics Concern   Not on file  Social History Narrative   Smoked from 1st grade to 12th grade then quit   Social Determinants of Health   Financial Resource Strain: Low Risk  (05/16/2018)   Overall Financial Resource Strain (CARDIA)    Difficulty of Paying Living Expenses: Not very hard  Food Insecurity: No Food Insecurity (05/16/2018)   Hunger Vital Sign    Worried About Running Out of Food in the Last Year: Never true    Ran Out of Food in the Last Year: Never true  Transportation Needs: No Transportation  Needs (05/16/2018)   PRAPARE - Administrator, Civil Service (Medical): No    Lack of Transportation (Non-Medical): No  Physical Activity: Inactive (05/16/2018)   Exercise Vital Sign    Days of Exercise per Week: 0 days    Minutes of Exercise per Session: 0 min  Stress: No Stress Concern Present (05/16/2018)   [employer] of Occupational Health - Occupational Stress Questionnaire    Feeling of Stress : Not at all  Social Connections: Moderately Integrated (05/16/2018)   Social Connection and Isolation Panel [NHANES]    Frequency of Communication with Friends and Family: More than three times a week    Frequency of Social Gatherings with Friends and Family: More than three times a week    Attends Religious Services: More than 4 times per year    Active Member of [employer] or Organizations: No    Attends Engineer, structural: Never    Marital Status: Married  Does use snuff  Family History: The patient's family history includes Heart attack in his father; Heart failure in his father; Hyperlipidemia in his mother; Hypothyroidism in his mother. His father passed away at age 48 from congestive heart failure.  ROS:   Please see the history of present illness.    All other systems are reviewed and are negative.  EKGs/Labs/Other Studies Reviewed:    The following studies were reviewed today: Cardiac catheterization December 16, 2019 Right and left heart filling pressures were very normal (mean PAWP 12, mean RA 4, PA 27/8, LVEDP 12, cardiac index 2.1).  There was no evidence of congestive heart failure.  Angiography did show early failure of the SVG-ramus intermedius, but the ramus intermedius artery itself had about a 60% stenosis that was not significant by FFR.  The LIMA to the LAD was widely patent.  There was an approximately 40% stenosis in the left circumflex and no visible stenosis in the right coronary artery. Echocardiogram 12/03/2021 [facility] Aortic Valve: The  leaflets are mildly thickened. There is mild  sclerosis.    Aortic Valve: There is no regurgitation or stenosis.    Left Ventricle: Systolic function is normal. EF: 55-60%.    EKG:  EKG is ordered today, personally reviewed and shows normal sinus rhythm without any repolarization abnormalities.  Borderline QTc 454 ms  Recent Labs: No results  found for requested labs within last 365 days.   01/07/2022 Hemoglobin A1c 11.1%, glucose 743, sodium 122, creatinine 1.82 12/04/2021 Hemoglobin 12.2 10/03/2021 TSH 0.87, free T41.22 Recent Lipid Panel 11/28/2021 Cholesterol 95, LDL 53, HDL 19, triglycerides 115  Lipid Panel     Component Value Date/Time   CHOL 228 (H) 11/05/2018 1607   TRIG 587 (HH) 11/05/2018 1607   HDL 33 (L) 11/05/2018 1607   CHOLHDL 6.9 (H) 11/05/2018 1607   LDLCALC Comment 11/05/2018 1607     Physical Exam:    VS:  BP 94/68 (BP Location: Left Arm, Patient Position: Sitting, Cuff Size: Normal)   Pulse 80   Ht 5\' 6"  (1.676 m)   Wt 189 lb 14.4 oz (86.1 kg)   SpO2 91%   BMI 30.65 kg/m     Wt Readings from Last 3 Encounters:  04/02/22 189 lb 14.4 oz (86.1 kg)  01/08/22 168 lb 6.4 oz (76.4 kg)  12/13/20 186 lb 15.2 oz (84.8 kg)     General: Alert, oriented x3, no distress, moderately overweight.  Appears chronically ill. Head: no evidence of trauma, PERRL, EOMI, no exophtalmos or lid lag, no myxedema, no xanthelasma; normal ears, nose and oropharynx Neck: normal jugular venous pulsations and no hepatojugular reflux; brisk carotid pulses without delay and no carotid bruits Chest: clear to auscultation, no signs of consolidation by percussion or palpation, normal fremitus, symmetrical and full respiratory excursions Cardiovascular: normal position and quality of the apical impulse, regular rhythm, normal first and second heart sounds, no murmurs, rubs or gallops Abdomen: no tenderness or distention, no masses by palpation, no abnormal pulsatility or arterial  bruits, normal bowel sounds, no hepatosplenomegaly Extremities: no clubbing, cyanosis or edema; 2+ radial, ulnar and brachial pulses bilaterally; 2+ right femoral, posterior tibial and dorsalis pedis pulses; 2+ left femoral, posterior tibial and dorsalis pedis pulses; no subclavian or femoral bruits Neurological: grossly nonfocal Psych: Normal mood and affect     ASSESSMENT:    1. Chronic diastolic CHF (congestive heart failure) ([place])   2. Coronary artery disease involving autologous vein coronary bypass graft without angina pectoris   3. Uncontrolled type 2 diabetes mellitus with hyperglycemia ([place])   4. Mixed hyperlipidemia   5. OSA (obstructive sleep apnea)   6. History of transient ischemic attack (TIA)   7. Restrictive lung disease   8. Postoperative atrial fibrillation (HCC)      PLAN:    In order of problems listed above:  CHF: Borderline hypotensive today, but not symptomatic.  Suspect that he is chronically hypovolemic due to glucosuria.  Looks like he has gained back some real weight and will have to readjust his "dry weight".Remote heart failure exacerbation August 2020 that responded to diuretics.  Currently probably euvolemic or mildly hypovolemic.  Has normal left ventricular systolic function. CAD: He does not have angina pectoris.  S/p CABG March 2020 (LIMA to LAD and SVG to ramus intermedius).  Although he has lost the SVG to ramus intermedius, the native artery is still patent.   The focus is on risk factor modification. DM: Control has improved although still far from ideal.  Has stopped losing weight rapidly. HLP: Most recent lipid profile shows resolution of his severe hypertriglyceridemia.  He has lost a lot of weight.  On high-dose statin. OSA: Encouraged 100% compliance with CPAP. History of TIA: On aspirin.  No recent neurological complaints. Restrictive lung disease/asthma: Major reason for his dyspnea.  Predates COVID and bypass surgery.  FVC roughly 55% of  predicted range on prebypass surgery pulmonary function testing.  He has a chronic cough.  This is not productive.  Seeing a pulmonary specialist in [place], Dr. Jilda Roche.  He had severe groundglass opacities on his CT following his COVID-pneumonia.  He is scheduled for repeat evaluation in August with another CT. History of postop atrial fibrillation:. No recurrence since CABG 2020. ED: Likely to be multifactorial with contribution of diabetes related vascular abnormalities, neuropathy, poorly controlled hyperglycemia, dehydration.  May not respond to PDF inhibitors, but gave him a small supply so he can test them.  Start with sildenafil 50 mg and if this is ineffective can try 100 mg dose.  Insisted several times on the importance of avoiding any overlap between this medication and nitroglycerin or other nitrate containing products might need to see a urologist for other types of intervention.  Medication Adjustments/Labs and Tests Ordered: Current medicines are reviewed at length with the patient today.  Concerns regarding medicines are outlined above.  Orders Placed This Encounter  Procedures   EKG 12-Lead   Meds ordered this encounter  Medications   sildenafil (VIAGRA) 50 MG tablet    Sig: Take 1 tablet (50 mg total) by mouth daily as needed for erectile dysfunction.    Dispense:  10 tablet    Refill:  0    Patient Instructions  Medication Instructions:  TAKE Sildenafil 50 mg once daily as needed. May take a second tablet if needed  *If you need a refill on your cardiac medications before your next appointment, please call your pharmacy*   Lab Work: None ordered If you have labs (blood work) drawn today and your tests are completely normal, you will receive your results only by: [place] (if you have MyChart) OR A paper copy in the mail If you have any lab test that is abnormal or we need to change your treatment, we will call you to review the  results.   Testing/Procedures: None ordered   Follow-Up: At [facility], you and your health needs are our priority.  As part of our continuing mission to provide you with exceptional heart care, we have created designated Provider Care Teams.  These Care Teams include your primary Cardiologist (physician) and Advanced Practice Providers (APPs -  Physician Assistants and Nurse Practitioners) who all work together to provide you with the care you need, when you need it.  We recommend signing up for the patient portal called "MyChart".  Sign up information is provided on this After Visit Summary.  MyChart is used to connect with patients for Virtual Visits (Telemedicine).  Patients are able to view lab/test results, encounter notes, upcoming appointments, etc.  Non-urgent messages can be sent to your provider as well.   To learn more about what you can do with MyChart, go to [email or web address].    Your next appointment:   6 month(s)  The format for your next appointment:   In Person  Provider:   Sanda Klein, MD {  Important Information About Sugar       Time spent with patient during this encounter: 24 minutes.  Signed, Sanda Klein, MD  04/09/2022 9:09 AM    [facility]

## 2022-04-09 ENCOUNTER — Encounter: Payer: Self-pay | Admitting: Cardiovascular Disease

## 2022-06-26 ENCOUNTER — Telehealth: Payer: Self-pay | Admitting: Cardiovascular Disease

## 2022-06-26 NOTE — Telephone Encounter (Signed)
*  STAT* If patient is at the pharmacy, call can be transferred to refill team.   1. Which medications need to be refilled? (please list name of each medication and dose if known) metoprolol tartrate (LOPRESSOR) 25 MG tablet   2. Which pharmacy/location (including street and city if local pharmacy) is medication to be sent to? [facility] [id number] - [address], [place]  3. Do they need a 30 day or 90 day supply? 90 day  Patient is out of medication

## 2022-07-02 ENCOUNTER — Encounter: Payer: Self-pay | Admitting: Cardiovascular Disease

## 2022-07-21 MED ORDER — METOPROLOL TARTRATE 25 MG PO TABS: 12.5000 mg | ORAL_TABLET | Freq: Two times a day (BID) | ORAL | 1 refills | Status: DC

## 2022-07-21 NOTE — Telephone Encounter (Signed)
Called and spoke with Wife, Maxamilian Amadon, she states that she needs a refill of pt's Metoprolol. Upon review the metoprolol was stopped at appt in May. At that appt pt was taking his metoprolol Tart. Incorrectly, as he was only taking once a day in the am. Dr Royann Shivers stopped medication at the may appt and it is not in the notes for the August 2023 appt. Pt wife states that Dr Royann Shivers told pt that he was taking metoprolol incorrectly so he stopped it. Wife states that he has been taking the metoprolol correctly since the May appointment and now needs a refill. Informed pt that Dr Royann Shivers is at the hospital and I will send message for Dr Royann Shivers to review. Verbalized understanding

## 2022-07-21 NOTE — Telephone Encounter (Signed)
Please refill metoprolol tartrate 12.5 mg twice daily

## 2022-08-07 ENCOUNTER — Telehealth: Payer: Self-pay | Admitting: Cardiovascular Disease

## 2022-08-07 NOTE — Telephone Encounter (Signed)
*  STAT* If patient is at the pharmacy, call can be transferred to refill team.   1. Which medications need to be refilled? (please list name of each medication and dose if known)  sildenafil (VIAGRA) 100 MG tablet  2. Which pharmacy/location (including street and city if local pharmacy) is medication to be sent to? [facility] [id number] - [place], [place] - [address]  3. Do they need a 30 day or 90 day supply?  90 day supply  Patient is requesting to have his current does increased to 100 MG. He states that in the past Dr. Royann Shivers advised that if needed he will increase it.

## 2022-08-07 NOTE — Telephone Encounter (Signed)
OK to refill sildenafil 100 mg once daily as needed, #20, RF 3

## 2022-08-08 MED ORDER — SILDENAFIL CITRATE 100 MG PO TABS: 100.0000 mg | ORAL_TABLET | Freq: Every day | ORAL | 3 refills | Status: AC | PRN

## 2022-08-08 NOTE — Telephone Encounter (Signed)
Refill sent in- Sildenafil 100mg  once daily as needed. 20 tablets with 3 refills. Attempted to call patient but unable to reach. Left message that refill sent in as requested. Advised on message to call back with any issues.

## 2022-09-14 ENCOUNTER — Encounter: Payer: Self-pay | Admitting: Cardiovascular Disease

## 2022-09-15 MED ORDER — FUROSEMIDE 20 MG PO TABS: ORAL_TABLET | ORAL | 1 refills | Status: DC

## 2022-10-17 ENCOUNTER — Other Ambulatory Visit: Payer: Self-pay | Admitting: Cardiovascular Disease

## 2022-10-20 ENCOUNTER — Encounter: Payer: Self-pay | Admitting: Cardiovascular Disease

## 2022-10-22 MED ORDER — FUROSEMIDE 20 MG PO TABS: ORAL_TABLET | ORAL | 1 refills | Status: DC

## 2022-10-23 NOTE — Telephone Encounter (Signed)
OK to refill the metoprolol as he is taking it currently 12.5 mg in AM and 25 mg in PM and place back on list.

## 2022-10-24 MED ORDER — METOPROLOL TARTRATE 25 MG PO TABS: ORAL_TABLET | ORAL | 1 refills | Status: DC

## 2022-11-17 ENCOUNTER — Telehealth: Payer: Self-pay | Admitting: Cardiovascular Disease

## 2022-11-17 NOTE — Telephone Encounter (Signed)
Pt's wife is requesting a nurse to call her. Pt's wife did not state what it was in regards to and was hoping to get a callback by today.

## 2022-11-17 NOTE — Telephone Encounter (Signed)
Returned call to patients wife- who states patient has been having elevated urine output/dizziness and she feels patient is dehydrated. Per patients wife patient has been having around 3-4 Liters of urine output in his catheter bag daily. Patients wife states that patient just needs to stop his diuretics Lasix, and spironolactone. Asked patients wife about patients blood sugar and patients wife reports that it is always high but they are working on it. Unable to provide vital signs or blood glucose level. Advised patient's wife for concern of elevated blood sugar and elevation in urine output. Patients wife states she is certain that it has nothing to do with each other and that patient just needs to stop his diuretics so that he can get rehydrated. Patients wife would like message sent over to Dr. Loletha Grayer for review. Will forward message over

## 2022-11-18 NOTE — Telephone Encounter (Signed)
Attempted to call patient, left message for patient to call back to office.   

## 2022-11-18 NOTE — Telephone Encounter (Signed)
Received a call from patient's wife Dr.Croitoru's advice given.

## 2022-11-18 NOTE — Telephone Encounter (Signed)
Okay to temporarily stop furosemide and spironolactone until he reestablishes normal volume.  Monitoring his weight on a day basis can be very helpful to decide if he is dehydrated, at normal baseline or "wet". Having blood glucose over 200 does have a diuretic effect whether or not he is taking prescription diuretics. Needs to drink plenty of non-sugar containing fluids.

## 2022-11-18 NOTE — Telephone Encounter (Signed)
Pt is returning call.  

## 2022-11-18 NOTE — Telephone Encounter (Signed)
Returned call to patient left message on personal voice mail to call back. 

## 2023-01-12 ENCOUNTER — Encounter: Payer: Self-pay | Admitting: Cardiovascular Disease

## 2023-03-19 ENCOUNTER — Other Ambulatory Visit: Payer: Self-pay | Admitting: *Deleted

## 2023-03-20 ENCOUNTER — Other Ambulatory Visit: Payer: Self-pay

## 2023-03-20 MED ORDER — METOPROLOL TARTRATE 25 MG PO TABS: ORAL_TABLET | ORAL | 1 refills | Status: DC

## 2023-05-21 ENCOUNTER — Other Ambulatory Visit: Payer: Self-pay | Admitting: Cardiovascular Disease

## 2023-05-29 ENCOUNTER — Other Ambulatory Visit: Payer: Self-pay | Admitting: Cardiovascular Disease

## 2023-07-21 ENCOUNTER — Other Ambulatory Visit: Payer: Self-pay | Admitting: Cardiovascular Disease

## 2023-07-22 ENCOUNTER — Other Ambulatory Visit: Payer: Self-pay

## 2023-07-22 MED ORDER — FUROSEMIDE 20 MG PO TABS: ORAL_TABLET | ORAL | 0 refills | Status: DC

## 2023-08-18 ENCOUNTER — Other Ambulatory Visit: Payer: Self-pay

## 2023-08-18 MED ORDER — FUROSEMIDE 20 MG PO TABS: ORAL_TABLET | ORAL | 0 refills | Status: DC

## 2023-08-29 ENCOUNTER — Other Ambulatory Visit: Payer: Self-pay | Admitting: Cardiovascular Disease

## 2023-09-15 ENCOUNTER — Other Ambulatory Visit: Payer: Self-pay | Admitting: Cardiovascular Disease

## 2023-09-27 ENCOUNTER — Other Ambulatory Visit: Payer: Self-pay | Admitting: Cardiovascular Disease

## 2023-09-29 ENCOUNTER — Emergency Department (HOSPITAL_COMMUNITY): Payer: Medicare Other

## 2023-09-29 ENCOUNTER — Telehealth: Payer: Self-pay | Admitting: Cardiovascular Disease

## 2023-09-29 ENCOUNTER — Encounter (HOSPITAL_COMMUNITY): Payer: Self-pay

## 2023-09-29 ENCOUNTER — Emergency Department (HOSPITAL_COMMUNITY)
Admission: EM | Admit: 2023-09-29 | Discharge: 2023-09-30 | Discharge status: Against Medical Advice | Payer: Medicare Other | Attending: Emergency Medicine | Admitting: Emergency Medicine

## 2023-09-29 DIAGNOSIS — Z5321 Procedure and treatment not carried out due to patient leaving prior to being seen by health care provider: Secondary | ICD-10-CM | POA: Insufficient documentation

## 2023-09-29 DIAGNOSIS — R0602 Shortness of breath: Secondary | ICD-10-CM | POA: Diagnosis present

## 2023-09-29 DIAGNOSIS — Z951 Presence of aortocoronary bypass graft: Secondary | ICD-10-CM | POA: Insufficient documentation

## 2023-09-29 DIAGNOSIS — R059 Cough, unspecified: Secondary | ICD-10-CM | POA: Insufficient documentation

## 2023-09-29 LAB — BASIC METABOLIC PANEL
Anion gap: 12 (ref 5–15)
BUN: 74 mg/dL — ABNORMAL HIGH (ref 8–23)
CO2: 19 mmol/L — ABNORMAL LOW (ref 22–32)
Calcium: 9.3 mg/dL (ref 8.9–10.3)
Chloride: 101 mmol/L (ref 98–111)
Creatinine, Ser: 2.12 mg/dL — ABNORMAL HIGH (ref 0.61–1.24)
GFR, Estimated: 35 mL/min — ABNORMAL LOW (ref >60)
Glucose, Bld: 226 mg/dL — ABNORMAL HIGH (ref 70–99)
Potassium: 5.3 mmol/L — ABNORMAL HIGH (ref 3.5–5.1)
Sodium: 132 mmol/L — ABNORMAL LOW (ref 135–145)

## 2023-09-29 LAB — CBC
HCT: 37.2 % — ABNORMAL LOW (ref 39.0–52.0)
Hemoglobin: 12.3 g/dL — ABNORMAL LOW (ref 13.0–17.0)
MCH: 29.2 pg (ref 26.0–34.0)
MCHC: 33.1 g/dL (ref 30.0–36.0)
MCV: 88.4 fL (ref 80.0–100.0)
Platelets: 225 10*3/uL (ref 150–400)
RBC: 4.21 MIL/uL — ABNORMAL LOW (ref 4.22–5.81)
RDW: 15.7 % — ABNORMAL HIGH (ref 11.5–15.5)
WBC: 9.4 10*3/uL (ref 4.0–10.5)
nRBC: 0 % (ref 0.0–0.2)

## 2023-09-29 LAB — BRAIN NATRIURETIC PEPTIDE: B Natriuretic Peptide: 19.7 pg/mL (ref 0.0–100.0)

## 2023-09-29 LAB — D-DIMER, QUANTITATIVE: D-Dimer, Quant: 0.54 ug{FEU}/mL — ABNORMAL HIGH (ref 0.00–0.50)

## 2023-09-29 LAB — TROPONIN I (HIGH SENSITIVITY): Troponin I (High Sensitivity): 7 ng/L (ref <18)

## 2023-09-29 NOTE — ED Provider Triage Note (Signed)
Emergency Medicine Provider Triage Evaluation Note  Eric [name] , a 63 y.o. male  was evaluated in triage.  Pt complains of shortness of breath and cough x8 months.  Patient reports shortness of breath with exertion as well as intermittent chest discomfort with exertion.  History of CABG in the past.  No lower extremity edema.  Review of Systems  Positive: Cough, SOB Negative: CP  Physical Exam  BP 116/75   Pulse 89   Temp 98.6 F (37 C)   Resp 16   SpO2 99%  Gen:   Awake, no distress   Resp:  Normal effort  MSK:   Moves extremities without difficulty  Other:    Medical Decision Making  Medically screening exam initiated at 8:46 PM.  Appropriate orders placed.  [name] was informed that the remainder of the evaluation will be completed by another provider, this initial triage assessment does not replace that evaluation, and the importance of remaining in the ED until their evaluation is complete.   Maxwell Marion, PA-C 09/29/23 [phone]

## 2023-09-29 NOTE — Telephone Encounter (Signed)
Called and spoke with the patient. States he is having intermittent chest pain and his cough is getting worse. Patient advised given his extensive cardiac history it is advised that he call 911 or go to the nearest ER if he feels stable enough to drive as he was stating that his wife was at work and he hated to bother her. I strongly reinforced to him that this office is not equipped to deal with such an emergency and he would be best serviced by using the appropriate level of care. I again explained that he has not been seen in this office in almost a year and needs to be evaluated now.  [place] going to [facility]  He states he would much rather come to a [facility] facility. I instructed him that he needs to call his wife and ask her to drive him to [facility] ( his stated preference)  to come to the hospital that he wants as he kept refusing to call 911. Patient finally agreed to call wife and seek treatment.

## 2023-09-29 NOTE — Telephone Encounter (Signed)
Pt c/o Shortness Of Breath: STAT if SOB developed within the last 24 hours or pt is noticeably SOB on the phone  1. Are you currently SOB (can you hear that pt is SOB on the phone)?  Yes  2. How long have you been experiencing SOB?   About 7-8 months since he started coughing  3. Are you SOB when sitting or when up moving around?   Both  4. Are you currently experiencing any other symptoms?   Chest pains over his heart, falling  Patient stated when he starts eating his chest hurts around his heart. Patient is concerned about his SOB and it may be heart related.

## 2023-09-29 NOTE — ED Triage Notes (Signed)
Pt is coming in for shortness of breath that he has been experiencing for around 7 to 8 months now, he has not seen a provider for this issue but does have a Hx of a CABG. Pt is no on blood thinners currently. He express some chest tightness as well as exertional shortness of breath which was witnessed as he ambulated to the triage room. There is no lower leg swelling noted. Pt is otherwise stable at this time.

## 2023-09-29 NOTE — ED Notes (Signed)
Patient left.

## 2023-10-22 ENCOUNTER — Other Ambulatory Visit (HOSPITAL_COMMUNITY): Payer: Self-pay

## 2023-10-22 ENCOUNTER — Ambulatory Visit: Payer: Medicare Other | Attending: Physician Assistant | Admitting: Cardiology

## 2023-10-22 ENCOUNTER — Encounter: Payer: Self-pay | Admitting: Physician Assistant

## 2023-10-22 ENCOUNTER — Other Ambulatory Visit: Payer: Self-pay

## 2023-10-22 ENCOUNTER — Telehealth: Payer: Self-pay

## 2023-10-22 VITALS — BP 110/70 | HR 85 | Ht 66.0 in | Wt 185.0 lb

## 2023-10-22 DIAGNOSIS — I25810 Atherosclerosis of coronary artery bypass graft(s) without angina pectoris: Secondary | ICD-10-CM

## 2023-10-22 DIAGNOSIS — E782 Mixed hyperlipidemia: Secondary | ICD-10-CM

## 2023-10-22 DIAGNOSIS — N1832 Chronic kidney disease, stage 3b: Secondary | ICD-10-CM

## 2023-10-22 DIAGNOSIS — I429 Cardiomyopathy, unspecified: Secondary | ICD-10-CM

## 2023-10-22 DIAGNOSIS — Z79899 Other long term (current) drug therapy: Secondary | ICD-10-CM | POA: Diagnosis not present

## 2023-10-22 DIAGNOSIS — E1165 Type 2 diabetes mellitus with hyperglycemia: Secondary | ICD-10-CM

## 2023-10-22 DIAGNOSIS — I10 Essential (primary) hypertension: Secondary | ICD-10-CM

## 2023-10-22 DIAGNOSIS — G4733 Obstructive sleep apnea (adult) (pediatric): Secondary | ICD-10-CM

## 2023-10-22 DIAGNOSIS — R053 Chronic cough: Secondary | ICD-10-CM

## 2023-10-22 DIAGNOSIS — I5032 Chronic diastolic (congestive) heart failure: Secondary | ICD-10-CM

## 2023-10-22 MED ORDER — EMPAGLIFLOZIN 10 MG PO TABS: 10.0000 mg | ORAL_TABLET | Freq: Every day | ORAL | 3 refills | Status: DC

## 2023-10-22 MED ORDER — EMPAGLIFLOZIN 10 MG PO TABS
10.0000 mg | ORAL_TABLET | Freq: Every day | ORAL | 3 refills | Status: AC
  Filled 2023-10-22 – 2024-02-03 (×7): qty 90, 90d supply, fill #0

## 2023-10-22 MED ORDER — METOPROLOL SUCCINATE ER 25 MG PO TB24: 25.0000 mg | ORAL_TABLET | Freq: Every day | ORAL | 3 refills | Status: DC

## 2023-10-22 NOTE — Telephone Encounter (Signed)
Pharmacy Patient Advocate Encounter   Received notification from Physician's Office that prior authorization for JARDIANCE/FARXIGA is required/requested.   Insurance verification completed.   The patient is insured through [employer] .   Per test claim: The current 30 day co-pay is, $269.12 (DEDUCTIBLE).  No PA needed at this time. This test claim was processed through [facility]- copay amounts may vary at other pharmacies due to pharmacy/plan contracts, or as the patient moves through the different stages of their insurance plan.

## 2023-10-22 NOTE — Patient Instructions (Addendum)
Medication Instructions:  STOP LASIX  STOP SPIRONOLACTONE   STOP METOPROLOL TARTRATE  START JARDIANCE 10 MG DAILY  START TOPROL XL 25 MG DAILY *If you need a refill on your cardiac medications before your next appointment, please call your pharmacy*   Lab Work: BMP IN 2 WEEKS  FASTING LIPID PANEL IN 2 MONTHS  If you have labs (blood work) drawn today and your tests are completely normal, you will receive your results only by: MyChart Message (if you have MyChart) OR A paper copy in the mail If you have any lab test that is abnormal or we need to change your treatment, we will call you to review the results.   Testing/Procedures: NO TESTING   Follow-Up: At [facility], you and your health needs are our priority.  As part of our continuing mission to provide you with exceptional heart care, we have created designated Provider Care Teams.  These Care Teams include your primary Cardiologist (physician) and Advanced Practice Providers (APPs -  Physician Assistants and Nurse Practitioners) who all work together to provide you with the care you need, when you need it.  Your next appointment:   6 month(s)  Provider:   Thurmon Fair, MD   Other Instructions You have been referred to Nephrology

## 2023-10-22 NOTE — Addendum Note (Signed)
Addended by: Cheree Ditto on: 10/22/2023 04:16 PM   Modules accepted: Orders

## 2023-10-22 NOTE — Progress Notes (Signed)
Cardiology Office Note:  .   Date:  10/22/2023  ID:  Eric [name], DOB [birth year], MRN [id number] PCP: Rudi Coco, MD  [facility] Providers Cardiologist:  Thurmon Fair, MD {  History of Present Illness: Eric [name] is a 64 y.o. 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 Used to be a Psychologist, occupational.   Overall seems to have stable CAD.  Biggest complaints are weakness and severe neuropathy in the lower extremities.  Due to the complaints of persistent dyspnea, fatigue, nonproductive cough he eventually had another right and left heart catheterization in April 2021 showing normal right and left heart pressures (wedge pressure 12, PA 27/8, mild to mod reduced cardiac output.  Left heart cath did show early failure of SVG-ramus intermedius but the native ramus intermedius artery itself had about 60% stenosis that was not significant by FFR.  LIMA to LAD was widely patent.  40% stenosis in the left circumflex and no significant disease in the RCA.  Also had another heart cath 2022 demonstrating no significant changes.  He has preserved EF and felt to be chronically hypovolemic secondary to uncontrolled diabetes.    He also reportedly had some intermittent chest pain with his cough and January 2025 recommended to go to the ED.  Negative troponins there and eventually left.  He has also had this chronic cough for at least 7 months to years, seen by infectious disease and given severity and history a CT of his chest was ordered along with comprehensive lab work.  He returns today for 6-month follow-up.  Overall doing well as best as he can.  Unfortunately not very functional due to severe  neuropathy and sedentary watching TV most days.  He still has persistent cough as above but only associated with chest pain and not exertional but again very sedentary.  Generally compliant with his medications but was not taking his nightly medications routinely including his atorvastatin.  Otherwise no other significant complaints.   ROS: Denies: Chest pain, shortness of breath, orthopnea, peripheral edema, palpitations, decreased exercise intolerance,    Studies Reviewed: Marland Kitchen    EKG here sinus with no new acute ST-T wave changes.  Unchanged.  Cardiac catheterization 04/16/2021 HEMOSTASIS:  Coronary Findings Diagnostic Dominance: Right  Left Main: Normal  Left Anterior Descending: Prox LAD lesion is 99% stenosed. Prox LAD to Mid LAD lesion is 100% stenosed.  Ramus Intermedius: Ramus lesion is 50% stenosed.  Left Circumflex: Ost Cx to Prox Cx lesion is 40% stenosed.  Right Coronary Artery: There are minimal luminal irregularities.  Graft To Ramus: Origin lesion is 100% stenosed.  LIMA Graft To Dist LAD   Intervention  No interventions have been documented.   Left Ventricle  LVEDP 9 mmHg   Complication  There were no immediate complications during the procedure.   Cardiac catheterization December 16, 2019 Right and left heart filling pressures were very normal (mean PAWP 12, mean RA 4, PA 27/8, LVEDP 12, cardiac index 2.1).  There was no evidence of congestive heart failure.  Angiography did show early failure of the SVG-ramus intermedius, but the ramus intermedius artery itself had about a 60% stenosis that was not significant by FFR.  The LIMA to the LAD was widely patent.  There was an  approximately 40% stenosis in the left circumflex and no visible stenosis in the right coronary artery. Echocardiogram 12/03/2021 [facility] Left Ventricle  Left ventricle size is normal. Wall thickness is normal. Systolic function is normal. EF: 60-65%. Wall motion is normal. Doppler parameters  indicate normal diastolic function.   Right Ventricle  Right ventricle size is normal. Systolic function is normal.   Left Atrium  Left atrium size is normal.   Right Atrium  Right atrium size is normal.   IVC/SVC  IVC not well visualized.   Mitral Valve  Mitral valve structure is normal. There is no prolapse. There is trace regurgitation.   Tricuspid Valve  Tricuspid valve structure is normal. There is trace regurgitation. Unable to assess RVSP due to incomplete Doppler signal.   Aortic Valve  The leaflets are mildly thickened. There is mild sclerosis. There is no regurgitation or stenosis.   Pulmonic Valve  The pulmonic valve was not well visualized. Trace regurgitation.   Ascending Aorta  The aortic root is normal in size. The ascending aorta is normal in size.   Pericardium  There is no pericardial effusion.   Study Details  A complete echo was performed using complete 2D, color flow Doppler and spectral Doppler. Overall the study quality was adequate. The study was technically difficult.        Risk Assessment/Calculations:      Physical Exam:   VS:  BP 110/70 (BP Location: Left Arm, Patient Position: Sitting, Cuff Size: Large)   Pulse 85   Ht 5\' 6"  (1.676 m)   Wt 185 lb (83.9 kg)   SpO2 96%   BMI 29.86 kg/m    Wt Readings from Last 3 Encounters:  10/22/23 185 lb (83.9 kg)  04/02/22 189 lb 14.4 oz (86.1 kg)  01/08/22 168 lb 6.4 oz (76.4 kg)    GEN: Well nourished, well developed in no acute distress NECK: No JVD; No carotid bruits CARDIAC: RRR, no murmurs, rubs, gallops RESPIRATORY:  Clear to auscultation without rales, wheezing or rhonchi  ABDOMEN: Soft, non-tender, non-distended EXTREMITIES:  No edema; No deformity   ASSESSMENT AND PLAN: .   CAD status post CABG 2020 Left heart catheterization April 2021 and in 2022 ([facility]) indicating severe single-vessel CAD 80 to 90% stenosis in the ostial LAD but with patent LIMA graft.  SVG to ramus was 100%  stenosed but has native ostial ramus intermedius with good flow, negative FFR.  No exertional chest pain.  Only atypical chest pain associated with chronic cough. Continue with aspirin, switching Lopressor 12.5 mg twice daily to Toprol-XL 25 mg for ease of dosing and compliancy, continue atorvastatin 80 mg was not taking this regularly.  Chronic HFpEF Functional class difficult to assess given sedentary lifestyle.  Does not have issues with volume. Starting Jardiance 10 mg both for CKD benefit, diabetes, CKD. Stop Lasix and spironolactone.  Suspect he might be more volume depleted.  Does not have issues with swelling.  Diabetes A1c 13.4%, uncontrolled.  Poor dietary discretion and frequently has unsweetened tea with supplemental sugar.  Management per primary team.  Hyperlipidemia Again not compliant with his atorvastatin 80 mg.  Encouraged compliancy now and will check a lipid panel in 2 months.  OSA on CPAP Has not been using this because he reports it is blows out hot air.  Primarily manage at [facility] asked him to reach back out to his sleep doctor to get this reordered or fix.  CKD  Starting Jardiance 10 mg.  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  Normally patient into patient assistance they will get this mailed to them.  Repeat BMP 2 weeks from now.  Chronic cough Previous history of smoking 24-pack-year history as well as previous Psychologist, occupational.  He has a CT chest pending.  Possible pneumoconocionisis        Dispo: 6 months.  Signed, Abagail Kitchens, PA-C

## 2023-10-22 NOTE — Telephone Encounter (Signed)
Patient Advocate Encounter   The patient was approved for a [employer] grant that will help cover the cost of JARDIANCE Total amount awarded, $10,000.  Effective: 09/22/23 - 09/20/24   [id number] OZH:[id number] ONGEX:[id number]   Pharmacy provided with approval and processing information. Patient informed via Dorcas Carrow, CPhT  Pharmacy Patient Advocate Specialist  Direct Number: [phone] Fax: [phone]

## 2023-10-23 ENCOUNTER — Other Ambulatory Visit (HOSPITAL_COMMUNITY): Payer: Self-pay

## 2023-10-23 ENCOUNTER — Other Ambulatory Visit: Payer: Self-pay

## 2023-10-25 ENCOUNTER — Other Ambulatory Visit: Payer: Self-pay | Admitting: Cardiovascular Disease

## 2023-10-27 ENCOUNTER — Other Ambulatory Visit: Payer: Self-pay | Admitting: Cardiovascular Disease

## 2024-01-18 ENCOUNTER — Telehealth: Payer: Self-pay | Admitting: Cardiovascular Disease

## 2024-01-18 NOTE — Telephone Encounter (Signed)
 Pt c/o medication issue:  1. Name of Medication:   empagliflozin  (JARDIANCE ) 10 MG TABS tablet    2. How are you currently taking this medication (dosage and times per day)?  Take 1 tablet (10 mg total) by mouth daily before breakfast.      3. Are you having a reaction (difficulty breathing--STAT)? No  4. What is your medication issue? Pt is requesting a callback regarding him wanting to know about the assistance with this medication. Please refer to 10/22/23 encounter as well. Please advise. Pt has been out of meds since Sunday he stated.

## 2024-01-18 NOTE — Telephone Encounter (Addendum)
 Left message for lizzie to call the company to see if he qualifies for assistance.

## 2024-01-20 ENCOUNTER — Other Ambulatory Visit (HOSPITAL_COMMUNITY): Payer: Self-pay

## 2024-01-21 NOTE — Telephone Encounter (Signed)
 See my chart message

## 2024-01-22 ENCOUNTER — Other Ambulatory Visit (HOSPITAL_COMMUNITY): Payer: Self-pay

## 2024-01-23 ENCOUNTER — Other Ambulatory Visit (HOSPITAL_COMMUNITY): Payer: Self-pay

## 2024-01-27 ENCOUNTER — Other Ambulatory Visit (HOSPITAL_COMMUNITY): Payer: Self-pay

## 2024-01-27 ENCOUNTER — Other Ambulatory Visit (HOSPITAL_BASED_OUTPATIENT_CLINIC_OR_DEPARTMENT_OTHER): Payer: Self-pay

## 2024-02-01 ENCOUNTER — Other Ambulatory Visit (HOSPITAL_COMMUNITY): Payer: Self-pay

## 2024-02-03 ENCOUNTER — Other Ambulatory Visit: Payer: Self-pay

## 2024-02-03 ENCOUNTER — Other Ambulatory Visit (HOSPITAL_COMMUNITY): Payer: Self-pay

## 2024-02-08 ENCOUNTER — Other Ambulatory Visit (HOSPITAL_COMMUNITY): Payer: Self-pay

## 2024-02-09 ENCOUNTER — Other Ambulatory Visit: Payer: Self-pay

## 2024-05-09 ENCOUNTER — Encounter: Payer: Self-pay | Admitting: Cardiovascular Disease

## 2024-05-10 ENCOUNTER — Telehealth: Payer: Self-pay | Admitting: Cardiovascular Disease

## 2024-05-10 NOTE — Telephone Encounter (Signed)
 Left message to call office.  Will forward to Dr Francyne to make him aware

## 2024-05-10 NOTE — Telephone Encounter (Signed)
 Called. Left message.

## 2024-05-10 NOTE — Telephone Encounter (Signed)
 Pt calling to make provider aware that he was in the hospital last Wednesday and went into Cardiac Arrest. Please advise

## 2024-05-11 NOTE — Telephone Encounter (Signed)
 Wife would like to speak to Dr Francyne and wants to know if he can call after 4:30. Please Advise

## 2024-05-11 NOTE — Telephone Encounter (Signed)
 Spoke with pt's wife and she had stated Dr. Francyne had tried to call yesterday afternoon but she was unable to pick up her phone due to being at work. Explained that we will forward this message to Dr. Francyne.

## 2024-05-12 NOTE — Telephone Encounter (Signed)
 Pt spouse calling back asking Dr. Francyne to call her after 4pm

## 2024-05-13 NOTE — Telephone Encounter (Signed)
 I spoke with Eric [name] yesterday.

## 2024-08-01 ENCOUNTER — Ambulatory Visit: Attending: Cardiovascular Disease | Admitting: Cardiovascular Disease

## 2024-08-01 VITALS — BP 130/80 | HR 83 | Ht 66.0 in | Wt 200.8 lb

## 2024-08-01 DIAGNOSIS — I25810 Atherosclerosis of coronary artery bypass graft(s) without angina pectoris: Secondary | ICD-10-CM

## 2024-08-01 DIAGNOSIS — E782 Mixed hyperlipidemia: Secondary | ICD-10-CM

## 2024-08-01 DIAGNOSIS — R053 Chronic cough: Secondary | ICD-10-CM

## 2024-08-01 DIAGNOSIS — E1142 Type 2 diabetes mellitus with diabetic polyneuropathy: Secondary | ICD-10-CM | POA: Diagnosis not present

## 2024-08-01 DIAGNOSIS — I5032 Chronic diastolic (congestive) heart failure: Secondary | ICD-10-CM

## 2024-08-01 DIAGNOSIS — G4733 Obstructive sleep apnea (adult) (pediatric): Secondary | ICD-10-CM

## 2024-08-01 NOTE — Progress Notes (Signed)
 Cardiology Office Note:  .   Date:  08/07/2024  ID:  Eric [name], DOB Jan 18, 1961, MRN [id number] PCP: Nickolas Dinsmore, MD  [facility] Providers Cardiologist:  Jerel Balding, MD {  History of Present Illness: Eric [name] is a 63 y.o. 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 Used to be a psychologist, occupational.   He is accompanied today by his wife, Eric [name].  She seems very frustrated and somewhat depressed by his lack of commitment to his own care.  He was hospitalized at [facility] 04/25/2024 with a scrotal abscess and underwent unilateral right orchiectomy for epididymo-orchitis.  He was quite sick.  Diabetes was very poorly controlled.  He had an acute kidney injury with creatinine increasing up to 3.4 compared to his baseline around 2 and had severe hyperkalemia accompanied by cardiac arrest requiring resuscitation.  ILizzie is quite upset that he does not understand that he was a step away from dying.  He seems a little aloof and in denial about how sick he was.  Did not have chest pain and do not have a true myocardial infarction.  His wife has been paying a lot of attention to his diet and is now in control of his medications.  His most recent hemoglobin A1c showed great improvement at 7.3%.  His most recent creatinine was 2.2, corresponding to a GFR of about 33.  Remains poorly unctional due to severe neuropathy and sedentary watching TV most days.    CAD does not appear to be an active problem, but a serious chronic concern.  Right and left heart catheterization in April 2021 showing normal right and left heart pressures (wedge pressure 12, PA 27/8, mild to mod reduced cardiac  output.  Left heart cath did show early failure of SVG-ramus intermedius but the native ramus intermedius artery itself had about 60% stenosis that was not significant by FFR.  LIMA to LAD was widely patent.  40% stenosis in the left circumflex and no significant disease in the RCA.  Also had another heart cath 2022 demonstrating no significant changes.  He has preserved EF and felt to be chronically hypovolemic secondary to uncontrolled diabetes.   Studies Reviewed: SABRA   Labs 02/04/2024 06/25/2024 creatinine 2.2, estimated GFR 33, potassium 5.0 normal liver function tests, Cholesterol 202, triglycerides 543, HDL 29, calculated LDL 85  EKG  EKG Interpretation Date/Time:  Monday August 01 2024 16:04:19 EST Ventricular Rate:  83 PR Interval:  214 QRS Duration:  96 QT Interval:  410 QTC Calculation: 481 R Axis:   -4  Text Interpretation: Sinus rhythm with 1st degree A-V block Inferior infarct (cited on or before 22-Oct-2023) When compared with ECG of 22-Oct-2023 13:11, QT has lengthened Confirmed by Jourdyn Hasler [id number]) on 08/01/2024 4:38:08 PM         Cardiac catheterization 04/16/2021 HEMOSTASIS:  Coronary Findings Diagnostic Dominance: Right  Left Main: Normal  Left Anterior Descending: Prox LAD lesion is 99% stenosed. Prox LAD to Mid LAD lesion is 100% stenosed.  Ramus Intermedius: Ramus lesion is 50% stenosed.  Left Circumflex: Ost Cx to Prox Cx lesion is 40% stenosed.  Right Coronary Artery: There are minimal luminal irregularities.  Graft To Ramus: Origin lesion is 100% stenosed.  LIMA Graft To Dist LAD  Intervention  No interventions have been documented.   Left Ventricle  LVEDP 9 mmHg       Echocardiogram 04/28/2024  Left Ventricle: Systolic function is low normal. EF: 50-55%.   Right Ventricle: Right ventricle is mildly dilated.   Right Ventricle: Systolic function is low normal.   The valves were not evaluated. Narrative  This result has an attachment  that is not available.  Left Ventricle Left ventricle size is normal. Systolic function is low normal. EF: 50-55%. Wall motion cannot be accurately assessed.  Right Ventricle Right ventricle is mildly dilated. Systolic function is low normal.  IVC/SVC The inferior vena cava demonstrates a diameter of <=2.1 cm and collapses <50%; therefore, the right atrial pressure is estimated at 8 mmHg.  Aortic Valve There is moderate sclerosis.  Pericardium There is no pericardial effusion.  Study Details A limited echo was performed using limited 2D. During the study the apical, parasternal and subcostal views were captured. Definity contrast was injected during the study. Patient has normal sinus rhythm. Overall the study quality was adequate. The imaging is on file and stored in a permanent location.  Risk Assessment/Calculations:      Physical Exam:   VS:  BP 130/80   Pulse 83   Ht 5' 6 (1.676 m)   Wt 200 lb 12.8 oz (91.1 kg)   BMI 32.41 kg/m    Wt Readings from Last 3 Encounters:  08/07/24 200 lb 12.8 oz (91.1 kg)  10/22/23 185 lb (83.9 kg)  04/02/22 189 lb 14.4 oz (86.1 kg)  Borderline obese General: Alert, oriented x3, no distress Head: no evidence of trauma, PERRL, EOMI, no exophtalmos or lid lag, no myxedema, no xanthelasma; normal ears, nose and oropharynx Neck: normal jugular venous pulsations and no hepatojugular reflux; brisk carotid pulses without delay and no carotid bruits Chest: clear to auscultation, no signs of consolidation by percussion or palpation, normal fremitus, symmetrical and full respiratory excursions Cardiovascular: normal position and quality of the apical impulse, regular rhythm, normal first and second heart sounds, no murmurs, rubs or gallops Abdomen: no tenderness or distention, no masses by palpation, no abnormal pulsatility or arterial bruits, normal bowel sounds, no hepatosplenomegaly Extremities: no clubbing, cyanosis or edema; 2+ radial, ulnar and  brachial pulses bilaterally; 2+ right femoral, posterior tibial and dorsalis pedis pulses; 2+ left femoral, posterior tibial and dorsalis pedis pulses; no subclavian or femoral bruits Neurological: grossly nonfocal Psych: Normal mood and affect   ASSESSMENT AND PLAN: .   CAD status post CABG 2020: He does not have angina pectoris.  Left heart catheterization April 2021 and in 2022 ([facility]) indicating severe single-vessel CAD 80 to 90% stenosis in the ostial LAD but with patent LIMA graft.  SVG to ramus was 100% stenosed but has native ostial ramus intermedius with good flow, negative FFR.  Continue treatment with aspirin , high-dose statin, beta-blockers. Chronic HFpEF: Clinically euvolemic, hard to assess functional status since he is so sedentary. Jardiance  10 mg for CKD benefit, diabetes, CKD.  Not requiring loop diuretics.  Diabetes: He has been very uninterested in his own health.  With his wife's intervention glycemic control is now much better, at 7.3 %, but for most of the last several years his A1c has been around 13-14%.  He shows very little understanding of his own health problems or involvement in his own wound care. Hyperlipidemia: Target LDL cholesterol preferably less than 55, definitely send 70, hard to assess adequacy of treatment since his triglycerides are so high in  the setting of poor glycemic control. OSA on CPAP: Strongly encouraged 100% compliance. CKD  3B: High concern for progression to permanent renal failure unless he takes better care of his own health.  Patient Instructions  Medication Instructions:  No changes *If you need a refill on your cardiac medications before your next appointment, please call your pharmacy*  Lab Work: None ordered If you have labs (blood work) drawn today and your tests are completely normal, you will receive your results only by: MyChart Message (if you have MyChart) OR A paper copy in the mail If you have any lab test that is abnormal  or we need to change your treatment, we will call you to review the results.  Testing/Procedures: None ordered  Follow-Up: At [facility], you and your health needs are our priority.  As part of our continuing mission to provide you with exceptional heart care, our providers are all part of one team.  This team includes your primary Cardiologist (physician) and Advanced Practice Providers or APPs (Physician Assistants and Nurse Practitioners) who all work together to provide you with the care you need, when you need it.  Your next appointment:   6 month(s)  Provider:   Jerel Balding, MD    We recommend signing up for the patient portal called MyChart.  Sign up information is provided on this After Visit Summary.  MyChart is used to connect with patients for Virtual Visits (Telemedicine).  Patients are able to view lab/test results, encounter notes, upcoming appointments, etc.  Non-urgent messages can be sent to your provider as well.   To learn more about what you can do with MyChart, go to [email or web address].      Signed, Jerel Balding, MD

## 2024-08-01 NOTE — Patient Instructions (Signed)
 Medication Instructions:  No changes *If you need a refill on your cardiac medications before your next appointment, please call your pharmacy*  Lab Work: None ordered If you have labs (blood work) drawn today and your tests are completely normal, you will receive your results only by: MyChart Message (if you have MyChart) OR A paper copy in the mail If you have any lab test that is abnormal or we need to change your treatment, we will call you to review the results.  Testing/Procedures: None ordered  Follow-Up: At [facility], you and your health needs are our priority.  As part of our continuing mission to provide you with exceptional heart care, our providers are all part of one team.  This team includes your primary Cardiologist (physician) and Advanced Practice Providers or APPs (Physician Assistants and Nurse Practitioners) who all work together to provide you with the care you need, when you need it.  Your next appointment:   6 month(s)  Provider:   Jerel Balding, MD    We recommend signing up for the patient portal called MyChart.  Sign up information is provided on this After Visit Summary.  MyChart is used to connect with patients for Virtual Visits (Telemedicine).  Patients are able to view lab/test results, encounter notes, upcoming appointments, etc.  Non-urgent messages can be sent to your provider as well.   To learn more about what you can do with MyChart, go to [email or web address].

## 2024-08-07 ENCOUNTER — Encounter: Payer: Self-pay | Admitting: Cardiovascular Disease
